# Patient Record
Sex: Male | Born: 1945 | Race: White | Hispanic: No | Smoking: Former smoker
Health system: Southern US, Community
[De-identification: ages and names within clinical notes are randomized; demographics above are authoritative.]

## PROBLEM LIST (undated history)

## (undated) DIAGNOSIS — I2510 Atherosclerotic heart disease of native coronary artery without angina pectoris: Secondary | ICD-10-CM

## (undated) DIAGNOSIS — E785 Hyperlipidemia, unspecified: Secondary | ICD-10-CM

## (undated) DIAGNOSIS — E119 Type 2 diabetes mellitus without complications: Secondary | ICD-10-CM

## (undated) DIAGNOSIS — K219 Gastro-esophageal reflux disease without esophagitis: Secondary | ICD-10-CM

## (undated) DIAGNOSIS — H9190 Unspecified hearing loss, unspecified ear: Secondary | ICD-10-CM

## (undated) DIAGNOSIS — I480 Paroxysmal atrial fibrillation: Secondary | ICD-10-CM

## (undated) DIAGNOSIS — I10 Essential (primary) hypertension: Secondary | ICD-10-CM

## (undated) HISTORY — DX: Paroxysmal atrial fibrillation: I48.0

## (undated) HISTORY — PX: COLONOSCOPY: SHX174

## (undated) HISTORY — PX: APPENDECTOMY: SHX54

---

## 2005-03-22 ENCOUNTER — Ambulatory Visit: Payer: Self-pay

## 2007-06-05 ENCOUNTER — Ambulatory Visit: Payer: Self-pay | Admitting: Gastroenterology

## 2010-03-28 ENCOUNTER — Ambulatory Visit: Payer: Self-pay | Admitting: Orthopedic Surgery

## 2010-04-26 ENCOUNTER — Ambulatory Visit: Payer: Self-pay | Admitting: Orthopedic Surgery

## 2013-03-05 ENCOUNTER — Ambulatory Visit: Payer: Self-pay | Admitting: Gastroenterology

## 2016-02-22 ENCOUNTER — Emergency Department (HOSPITAL_COMMUNITY)
Admission: EM | Admit: 2016-02-22 | Discharge: 2016-02-22 | Disposition: A | Discharge status: Home / Self Care | Payer: Medicare Other | Attending: Emergency Medicine | Admitting: Emergency Medicine

## 2016-02-22 ENCOUNTER — Emergency Department (HOSPITAL_COMMUNITY): Payer: Medicare Other

## 2016-02-22 ENCOUNTER — Encounter (HOSPITAL_COMMUNITY): Payer: Self-pay | Admitting: *Deleted

## 2016-02-22 DIAGNOSIS — E119 Type 2 diabetes mellitus without complications: Secondary | ICD-10-CM | POA: Diagnosis not present

## 2016-02-22 DIAGNOSIS — I10 Essential (primary) hypertension: Secondary | ICD-10-CM | POA: Insufficient documentation

## 2016-02-22 DIAGNOSIS — R05 Cough: Secondary | ICD-10-CM | POA: Diagnosis not present

## 2016-02-22 DIAGNOSIS — R059 Cough, unspecified: Secondary | ICD-10-CM

## 2016-02-22 HISTORY — DX: Essential (primary) hypertension: I10

## 2016-02-22 HISTORY — DX: Type 2 diabetes mellitus without complications: E11.9

## 2016-02-22 HISTORY — DX: Hyperlipidemia, unspecified: E78.5

## 2016-02-22 MED ORDER — PREDNISONE 50 MG PO TABS
60.0000 mg | ORAL_TABLET | ORAL | Status: AC
  Administered 2016-02-22: 60 mg via ORAL
  Filled 2016-02-22: qty 1

## 2016-02-22 MED ORDER — PREDNISONE 20 MG PO TABS: 60.0000 mg | ORAL_TABLET | Freq: Every day | ORAL | Status: AC

## 2016-02-22 MED ORDER — ALBUTEROL SULFATE HFA 108 (90 BASE) MCG/ACT IN AERS
2.0000 | INHALATION_SPRAY | Freq: Four times a day (QID) | RESPIRATORY_TRACT | Status: DC
  Administered 2016-02-22: 2 via RESPIRATORY_TRACT
  Filled 2016-02-22: qty 6.7

## 2016-02-22 NOTE — Discharge Instructions (Signed)
As discussed, your cough is likely due to bronchitis, or inflammation of your lungs.  Please take the prescribed steroids as directed. In addition, please use the provided albuterol inhaler every 4 hours for the next 2 days.  Subsequently you may use it as needed. Tomorrow, please make an appointment with your primary care physician for next week. Return here for concerning changes in your condition.

## 2016-02-22 NOTE — ED Notes (Addendum)
Non-productive cough since Saturday. Pt denies any other symptoms at this time. Hypertension. SBP 193

## 2016-02-22 NOTE — ED Provider Notes (Signed)
CSN: [id number]     Arrival date & time 02/22/16  1753 History   First MD Initiated Contact with Patient 02/22/16 1831     Chief Complaint  Patient presents with  . Cough     (Consider location/radiation/quality/duration/timing/severity/associated sxs/prior Treatment) HPI Patient presents with concern of cough. Symptoms began about 5 days ago. Since onset cough has been persistent, with no relief from OTC medication. There is discomfort about the sternum, associated with coughing, described as soreness. No other chest pain, no dyspnea, no lightheadedness, no syncope, fever, chills, nausea, vomiting. Patient was well prior to the onset of symptoms.  Past Medical History  Diagnosis Date  . Hypertension   . Diabetes mellitus without complication (HCC)   . Hyperlipidemia    Past Surgical History  Procedure Laterality Date  . Appendectomy     No family history on file. Social History  Substance Use Topics  . Smoking status: Never Smoker   . Smokeless tobacco: None  . Alcohol Use: No    Review of Systems  Constitutional:       Per HPI, otherwise negative  HENT:       Per HPI, otherwise negative  Respiratory:       Per HPI, otherwise negative  Cardiovascular:       Per HPI, otherwise negative  Gastrointestinal: Negative for vomiting.  Endocrine:       Negative aside from HPI  Genitourinary:       Neg aside from HPI   Musculoskeletal:       Per HPI, otherwise negative  Skin: Negative.   Neurological: Negative for syncope.      Allergies  Review of patient's allergies indicates no known allergies.  Home Medications   Prior to Admission medications   Medication Sig Start Date End Date Taking? Authorizing Provider  predniSONE (DELTASONE) 20 MG tablet Take 3 tablets (60 mg total) by mouth daily with breakfast. 02/23/16 02/26/16  Gerhard Munch, MD   BP 185/71 mmHg  Pulse 69  Temp(Src) 97.9 F (36.6 C) (Oral)  Resp 16  Ht 6' (1.829 m)  Wt 243 lb (110.224 kg)   BMI 32.95 kg/m2  SpO2 97% Physical Exam  Constitutional: He is oriented to person, place, and time. He appears well-developed. No distress.  HENT:  Head: Normocephalic and atraumatic.  Eyes: Conjunctivae and EOM are normal.  Cardiovascular: Normal rate and regular rhythm.   Pulmonary/Chest: Effort normal. No stridor. No respiratory distress.  Minimal discomfort about the sternum with light palpation  Abdominal: He exhibits no distension.  Musculoskeletal: He exhibits no edema.  Neurological: He is alert and oriented to person, place, and time.  Skin: Skin is warm and dry.  Psychiatric: He has a normal mood and affect.  Nursing note and vitals reviewed.   ED Course  Procedures (including critical care time) Labs Review Labs Reviewed - No data to display  Imaging Review Dg Chest 2 View  02/22/2016  CLINICAL DATA:  70 year old male with, chest discomfort and congestion for 5 days. EXAM: CHEST  2 VIEW COMPARISON:  None. FINDINGS: Cardiomegaly identified. There is no evidence of focal airspace disease, pulmonary edema, suspicious pulmonary nodule/mass, pleural effusion, or pneumothorax. No acute bony abnormalities are identified. IMPRESSION: Cardiomegaly without evidence of acute cardiopulmonary disease. Electronically Signed   By: Harmon Pier M.D.   On: 02/22/2016 18:12   I have personally reviewed and evaluated these images and lab results as part of my medical decision-making.    MDM   Final diagnoses:  Cough   Elderly male presents with concern of 5 days of cough.  On here, the patient is afebrile, hemodynamically stable aside from mild hypertension. No pulmonary edema, no evidence for fluid retention, no evidence for distress, pneumonia, bacteremia, sepsis. Patient started on a course of steroids, albuterol therapy, will follow up with primary care.  Gerhard Munch, MD 02/22/16 [phone]

## 2017-07-24 ENCOUNTER — Other Ambulatory Visit: Payer: Self-pay | Admitting: Orthopedic Surgery

## 2017-07-24 DIAGNOSIS — M25561 Pain in right knee: Secondary | ICD-10-CM

## 2017-07-24 DIAGNOSIS — M2391 Unspecified internal derangement of right knee: Secondary | ICD-10-CM

## 2017-07-24 DIAGNOSIS — M1711 Unilateral primary osteoarthritis, right knee: Secondary | ICD-10-CM

## 2017-07-30 ENCOUNTER — Ambulatory Visit: Payer: Medicare Other

## 2017-07-30 ENCOUNTER — Ambulatory Visit
Admission: RE | Admit: 2017-07-30 | Discharge: 2017-07-30 | Disposition: A | Discharge status: Home / Self Care | Payer: Medicare Other | Source: Ambulatory Visit | Attending: Orthopedic Surgery | Admitting: Orthopedic Surgery

## 2017-07-30 DIAGNOSIS — X58XXXA Exposure to other specified factors, initial encounter: Secondary | ICD-10-CM | POA: Insufficient documentation

## 2017-07-30 DIAGNOSIS — S83241A Other tear of medial meniscus, current injury, right knee, initial encounter: Secondary | ICD-10-CM | POA: Diagnosis not present

## 2017-07-30 DIAGNOSIS — M25561 Pain in right knee: Secondary | ICD-10-CM | POA: Diagnosis not present

## 2017-07-30 DIAGNOSIS — M1711 Unilateral primary osteoarthritis, right knee: Secondary | ICD-10-CM

## 2017-07-30 DIAGNOSIS — M2391 Unspecified internal derangement of right knee: Secondary | ICD-10-CM

## 2018-02-17 ENCOUNTER — Encounter: Payer: Self-pay | Admitting: *Deleted

## 2018-02-26 ENCOUNTER — Encounter
Admission: RE | Disposition: A | Discharge status: Home / Self Care | Payer: Self-pay | Source: Ambulatory Visit | Attending: Ophthalmology

## 2018-02-26 ENCOUNTER — Ambulatory Visit: Payer: Medicare Other | Admitting: Anesthesiology

## 2018-02-26 ENCOUNTER — Ambulatory Visit
Admission: RE | Admit: 2018-02-26 | Discharge: 2018-02-26 | Disposition: A | Discharge status: Home / Self Care | Payer: Medicare Other | Source: Ambulatory Visit | Attending: Ophthalmology | Admitting: Ophthalmology

## 2018-02-26 DIAGNOSIS — K219 Gastro-esophageal reflux disease without esophagitis: Secondary | ICD-10-CM | POA: Insufficient documentation

## 2018-02-26 DIAGNOSIS — Z87891 Personal history of nicotine dependence: Secondary | ICD-10-CM | POA: Insufficient documentation

## 2018-02-26 DIAGNOSIS — Z7984 Long term (current) use of oral hypoglycemic drugs: Secondary | ICD-10-CM | POA: Insufficient documentation

## 2018-02-26 DIAGNOSIS — E119 Type 2 diabetes mellitus without complications: Secondary | ICD-10-CM | POA: Insufficient documentation

## 2018-02-26 DIAGNOSIS — H2511 Age-related nuclear cataract, right eye: Secondary | ICD-10-CM | POA: Insufficient documentation

## 2018-02-26 DIAGNOSIS — E7800 Pure hypercholesterolemia, unspecified: Secondary | ICD-10-CM | POA: Diagnosis not present

## 2018-02-26 DIAGNOSIS — Z79899 Other long term (current) drug therapy: Secondary | ICD-10-CM | POA: Diagnosis not present

## 2018-02-26 DIAGNOSIS — I10 Essential (primary) hypertension: Secondary | ICD-10-CM | POA: Insufficient documentation

## 2018-02-26 HISTORY — DX: Gastro-esophageal reflux disease without esophagitis: K21.9

## 2018-02-26 HISTORY — DX: Unspecified hearing loss, unspecified ear: H91.90

## 2018-02-26 HISTORY — PX: CATARACT EXTRACTION W/PHACO: SHX586

## 2018-02-26 LAB — GLUCOSE, CAPILLARY: GLUCOSE-CAPILLARY: 160 mg/dL — AB (ref 65–99)

## 2018-02-26 SURGERY — PHACOEMULSIFICATION, CATARACT, WITH IOL INSERTION
Anesthesia: Monitor Anesthesia Care | Laterality: Right

## 2018-02-26 MED ORDER — FENTANYL CITRATE (PF) 100 MCG/2ML IJ SOLN
INTRAMUSCULAR | Status: DC | PRN
  Administered 2018-02-26: 50 ug via INTRAVENOUS

## 2018-02-26 MED ORDER — MIDAZOLAM HCL 2 MG/2ML IJ SOLN
INTRAMUSCULAR | Status: AC
  Filled 2018-02-26: qty 2

## 2018-02-26 MED ORDER — MOXIFLOXACIN HCL 0.5 % OP SOLN: 1.0000 [drp] | OPHTHALMIC | Status: DC | PRN

## 2018-02-26 MED ORDER — SODIUM HYALURONATE 10 MG/ML IO SOLN
INTRAOCULAR | Status: DC | PRN
  Administered 2018-02-26: 0.55 mL via INTRAOCULAR

## 2018-02-26 MED ORDER — ARMC OPHTHALMIC DILATING DROPS
OPHTHALMIC | Status: AC
  Administered 2018-02-26: 1 via OPHTHALMIC
  Filled 2018-02-26: qty 0.4

## 2018-02-26 MED ORDER — MIDAZOLAM HCL 2 MG/2ML IJ SOLN
INTRAMUSCULAR | Status: DC | PRN
  Administered 2018-02-26 (×2): 1 mg via INTRAVENOUS

## 2018-02-26 MED ORDER — POVIDONE-IODINE 5 % OP SOLN
OPHTHALMIC | Status: DC | PRN
  Administered 2018-02-26: 1 via OPHTHALMIC

## 2018-02-26 MED ORDER — FENTANYL CITRATE (PF) 100 MCG/2ML IJ SOLN
INTRAMUSCULAR | Status: AC
  Filled 2018-02-26: qty 2

## 2018-02-26 MED ORDER — BSS IO SOLN
INTRAOCULAR | Status: DC | PRN
  Administered 2018-02-26: .1 mL via OPHTHALMIC

## 2018-02-26 MED ORDER — SODIUM CHLORIDE 0.9 % IV SOLN
INTRAVENOUS | Status: DC
  Administered 2018-02-26: 07:00:00 via INTRAVENOUS

## 2018-02-26 MED ORDER — EPINEPHRINE PF 1 MG/ML IJ SOLN
INTRAMUSCULAR | Status: AC
  Filled 2018-02-26: qty 1

## 2018-02-26 MED ORDER — SODIUM HYALURONATE 23 MG/ML IO SOLN
INTRAOCULAR | Status: AC
  Filled 2018-02-26: qty 0.6

## 2018-02-26 MED ORDER — MOXIFLOXACIN HCL 0.5 % OP SOLN
OPHTHALMIC | Status: DC | PRN
  Administered 2018-02-26: 0.2 mL via OPHTHALMIC

## 2018-02-26 MED ORDER — TETRACAINE 0.5 % OP SOLN OPTIME - NO CHARGE
OPHTHALMIC | Status: DC | PRN
  Administered 2018-02-26: 2 [drp] via OPHTHALMIC

## 2018-02-26 MED ORDER — POVIDONE-IODINE 5 % OP SOLN
OPHTHALMIC | Status: AC
  Filled 2018-02-26: qty 30

## 2018-02-26 MED ORDER — SODIUM HYALURONATE 23 MG/ML IO SOLN
INTRAOCULAR | Status: DC | PRN
  Administered 2018-02-26: 0.6 mL via INTRAOCULAR

## 2018-02-26 MED ORDER — LIDOCAINE HCL (PF) 4 % IJ SOLN
INTRAMUSCULAR | Status: AC
  Filled 2018-02-26: qty 5

## 2018-02-26 MED ORDER — MOXIFLOXACIN HCL 0.5 % OP SOLN
OPHTHALMIC | Status: AC
  Filled 2018-02-26: qty 3

## 2018-02-26 MED ORDER — BSS IO SOLN
INTRAOCULAR | Status: DC | PRN
  Administered 2018-02-26: 1 via INTRAOCULAR

## 2018-02-26 MED ORDER — ARMC OPHTHALMIC DILATING DROPS
1.0000 "application " | OPHTHALMIC | Status: AC
  Administered 2018-02-26 (×2): 1 via OPHTHALMIC

## 2018-02-26 SURGICAL SUPPLY — 18 items
DISSECTOR HYDRO NUCLEUS 50X22 (MISCELLANEOUS) ×3 IMPLANT
GLOVE BIO SURGEON STRL SZ8 (GLOVE) ×3 IMPLANT
GLOVE BIOGEL M 6.5 STRL (GLOVE) ×3 IMPLANT
GLOVE SURG LX 7.5 STRW (GLOVE) ×2
GLOVE SURG LX STRL 7.5 STRW (GLOVE) ×1 IMPLANT
GOWN STRL REUS W/ TWL LRG LVL3 (GOWN DISPOSABLE) ×2 IMPLANT
GOWN STRL REUS W/TWL LRG LVL3 (GOWN DISPOSABLE) ×4
LABEL CATARACT MEDS ST (LABEL) ×3 IMPLANT
LENS IOL TECNIS ITEC 23.5 (Intraocular Lens) ×3 IMPLANT
PACK CATARACT (MISCELLANEOUS) ×3 IMPLANT
PACK CATARACT KING (MISCELLANEOUS) ×3 IMPLANT
PACK EYE AFTER SURG (MISCELLANEOUS) ×3 IMPLANT
SOL BAL SALT 15ML (MISCELLANEOUS) ×3
SOL BSS BAG (MISCELLANEOUS) ×3
SOLUTION BAL SALT 15ML (MISCELLANEOUS) ×1 IMPLANT
SOLUTION BSS BAG (MISCELLANEOUS) ×1 IMPLANT
WATER STERILE IRR 250ML POUR (IV SOLUTION) ×3 IMPLANT
WIPE NON LINTING 3.25X3.25 (MISCELLANEOUS) ×3 IMPLANT

## 2018-02-26 NOTE — Anesthesia Preprocedure Evaluation (Signed)
Anesthesia Evaluation  Patient identified by MRN, date of birth, ID band Patient awake    Reviewed: Allergy & Precautions, H&P , NPO status , Patient's Chart, lab work & pertinent test results  History of Anesthesia Complications Negative for: history of anesthetic complications  Airway Mallampati: III  TM Distance: <3 FB Neck ROM: limited    Dental  (+) Poor Dentition, Missing, Edentulous Upper, Edentulous Lower, Upper Dentures, Lower Dentures   Pulmonary neg shortness of breath, former smoker,           Cardiovascular Exercise Tolerance: Good hypertension, (-) angina(-) Past MI and (-) DOE      Neuro/Psych negative neurological ROS  negative psych ROS   GI/Hepatic Neg liver ROS, GERD  Medicated and Controlled,  Endo/Other  diabetes, Type 2  Renal/GU      Musculoskeletal   Abdominal   Peds  Hematology negative hematology ROS (+)   Anesthesia Other Findings Past Medical History: No date: Diabetes mellitus without complication (HCC) No date: GERD (gastroesophageal reflux disease) No date: HOH (hard of hearing)     Comment:  AIDS No date: Hyperlipidemia No date: Hypertension  Past Surgical History: No date: APPENDECTOMY No date: COLONOSCOPY  BMI    Body Mass Index:  33.91 kg/m      Reproductive/Obstetrics negative OB ROS                             Anesthesia Physical Anesthesia Plan  ASA: III  Anesthesia Plan: MAC   Post-op Pain Management:    Induction: Intravenous  PONV Risk Score and Plan:   Airway Management Planned: Natural Airway and Nasal Cannula  Additional Equipment:   Intra-op Plan:   Post-operative Plan:   Informed Consent: I have reviewed the patients History and Physical, chart, labs and discussed the procedure including the risks, benefits and alternatives for the proposed anesthesia with the patient or authorized representative who has indicated  his/her understanding and acceptance.   Dental Advisory Given  Plan Discussed with: Anesthesiologist, CRNA and Surgeon  Anesthesia Plan Comments: (Patient consented for risks of anesthesia including but not limited to:  - adverse reactions to medications - damage to teeth, lips or other oral mucosa - sore throat or hoarseness - Damage to heart, brain, lungs or loss of life  Patient voiced understanding.)        Anesthesia Quick Evaluation

## 2018-02-26 NOTE — Discharge Instructions (Signed)
Eye Surgery Discharge Instructions  Expect mild scratchy sensation or mild soreness. DO NOT RUB YOUR EYE!  The day of surgery:  Minimal physical activity, but bed rest is not required  No reading, computer work, or close hand work  No bending, lifting, or straining.  May watch TV  For 24 hours:  No driving, legal decisions, or alcoholic beverages  Safety precautions  Eat anything you prefer: It is better to start with liquids, then soup then solid foods.  _____ Eye patch should be worn until postoperative exam tomorrow.  ____ Solar shield eyeglasses should be worn for comfort in the sunlight/patch while sleeping  Resume all regular medications including aspirin or Coumadin if these were discontinued prior to surgery. You may shower, bathe, shave, or wash your hair. Tylenol may be taken for mild discomfort.  Call your doctor if you experience significant pain, nausea, or vomiting, fever > 101 or other signs of infection. [phone] or [phone] Specific instructions:  Follow-up Information    Nevada Crane, MD Follow up.   Specialty:  Ophthalmology Why:  March 8 at 10:40am Contact information: [address] [place] [address] [phone]

## 2018-02-26 NOTE — Transfer of Care (Signed)
Immediate Anesthesia Transfer of Care Note  Patient: Herbert Torres  Procedure(s) Performed: CATARACT EXTRACTION PHACO AND INTRAOCULAR LENS PLACEMENT (IOC) (Right )  Patient Location: PACU  Anesthesia Type:MAC  Level of Consciousness: awake, alert  and oriented  Airway & Oxygen Therapy: Patient Spontanous Breathing  Post-op Assessment: Report given to RN  Post vital signs: Reviewed and stable  Last Vitals:  Vitals:   02/26/18 0624  BP: (!) 168/70  Pulse: 68  Resp: 17  Temp: 37.1 C  SpO2: 96%    Last Pain:  Vitals:   02/26/18 0624  TempSrc: Oral         Complications: No apparent anesthesia complications

## 2018-02-26 NOTE — Anesthesia Postprocedure Evaluation (Signed)
Anesthesia Post Note  Patient: Herbert Torres  Procedure(s) Performed: CATARACT EXTRACTION PHACO AND INTRAOCULAR LENS PLACEMENT (IOC) (Right )  Patient location during evaluation: PACU Anesthesia Type: MAC Level of consciousness: awake and alert Pain management: pain level controlled Vital Signs Assessment: post-procedure vital signs reviewed and stable Respiratory status: spontaneous breathing, nonlabored ventilation, respiratory function stable and patient connected to nasal cannula oxygen Cardiovascular status: stable and blood pressure returned to baseline Postop Assessment: no apparent nausea or vomiting Anesthetic complications: no     Last Vitals:  Vitals:   02/26/18 0807 02/26/18 0820  BP: (!) 130/57 123/60  Pulse: 61 (!) 57  Resp: 16   Temp:    SpO2: 97%     Last Pain:  Vitals:   02/26/18 0624  TempSrc: Oral                 Precious Haws Piscitello

## 2018-02-26 NOTE — Anesthesia Post-op Follow-up Note (Signed)
Anesthesia QCDR form completed.        

## 2018-02-26 NOTE — H&P (Signed)
The History and Physical notes are on paper, have been signed, and are to be scanned.   I have examined the patient and there are no changes to the H&P.   Willey Blade 02/26/2018 7:23 AM

## 2018-02-26 NOTE — Op Note (Signed)
OPERATIVE NOTE  Herbert Torres [id number] 02/26/2018   PREOPERATIVE DIAGNOSIS:  Nuclear sclerotic cataract right eye.  H25.11   POSTOPERATIVE DIAGNOSIS:    Nuclear sclerotic cataract right eye.     PROCEDURE:  Phacoemusification with posterior chamber intraocular lens placement of the right eye   LENS:   Implant Name Type Inv. Item Serial No. Manufacturer Lot No. LRB No. Used  LENS IOL DIOP 23.5 - [id number] Intraocular Lens LENS IOL DIOP 23.5 [id number]  Right 1       PCB00 +23.5   ULTRASOUND TIME: 0 minutes 38 seconds.  CDE 6.33   SURGEON:  Willey Blade, MD, MPH  ANESTHESIOLOGIST: Anesthesiologist: Piscitello, Cleda Mccreedy, MD CRNA: Danelle Berry, CRNA   ANESTHESIA:  Topical with tetracaine drops augmented with 1% preservative-free intracameral lidocaine.  ESTIMATED BLOOD LOSS: less than 1 mL.   COMPLICATIONS:  None.   DESCRIPTION OF PROCEDURE:  The patient was identified in the holding room and transported to the operating room and placed in the supine position under the operating microscope.  The right eye was identified as the operative eye and it was prepped and draped in the usual sterile ophthalmic fashion.   A 1.0 millimeter clear-corneal paracentesis was made at the 10:30 position. 0.5 ml of preservative-free 1% lidocaine with epinephrine was injected into the anterior chamber.  The anterior chamber was filled with Healon 5 viscoelastic.  A 2.4 millimeter keratome was used to make a near-clear corneal incision at the 8:00 position.  A curvilinear capsulorrhexis was made with a cystotome and capsulorrhexis forceps.  Balanced salt solution was used to hydrodissect and hydrodelineate the nucleus.   Phacoemulsification was then used in stop and chop fashion to remove the lens nucleus and epinucleus.  The remaining cortex was then removed using the irrigation and aspiration handpiece. Healon was then placed into the capsular bag to distend it for lens placement.  A lens  was then injected into the capsular bag.  The remaining viscoelastic was aspirated.   Wounds were hydrated with balanced salt solution.  The anterior chamber was inflated to a physiologic pressure with balanced salt solution.   Intracameral vigamox 0.1 mL undiluted was injected into the eye and a drop placed onto the ocular surface.  No wound leaks were noted.  The patient was taken to the recovery room in stable condition without complications of anesthesia or surgery  Willey Blade 02/26/2018, 8:05 AM

## 2018-02-26 NOTE — Anesthesia Postprocedure Evaluation (Signed)
Anesthesia Post Note  Patient: Herbert Torres  Procedure(s) Performed: CATARACT EXTRACTION PHACO AND INTRAOCULAR LENS PLACEMENT (IOC) (Right )  Patient location during evaluation: PACU Anesthesia Type: MAC Level of consciousness: awake, awake and alert and oriented Pain management: pain level controlled Vital Signs Assessment: post-procedure vital signs reviewed and stable Respiratory status: spontaneous breathing Cardiovascular status: blood pressure returned to baseline Postop Assessment: no headache Anesthetic complications: no     Last Vitals:  Vitals:   02/26/18 0624  BP: (!) 168/70  Pulse: 68  Resp: 17  Temp: 37.1 C  SpO2: 96%    Last Pain:  Vitals:   02/26/18 0624  TempSrc: Oral                 Philbert Riser

## 2020-10-15 ENCOUNTER — Emergency Department (HOSPITAL_COMMUNITY): Payer: Medicare Other

## 2020-10-15 ENCOUNTER — Inpatient Hospital Stay (HOSPITAL_COMMUNITY)
Admission: EM | Admit: 2020-10-15 | Discharge: 2020-10-29 | DRG: 234 | Disposition: A | Discharge status: Home Health | Payer: Medicare Other | Attending: Thoracic Surgery (Cardiothoracic Vascular Surgery) | Admitting: Thoracic Surgery (Cardiothoracic Vascular Surgery)

## 2020-10-15 ENCOUNTER — Other Ambulatory Visit: Payer: Self-pay

## 2020-10-15 ENCOUNTER — Encounter (HOSPITAL_COMMUNITY): Payer: Self-pay | Admitting: Emergency Medicine

## 2020-10-15 DIAGNOSIS — I2584 Coronary atherosclerosis due to calcified coronary lesion: Secondary | ICD-10-CM | POA: Diagnosis present

## 2020-10-15 DIAGNOSIS — Z79899 Other long term (current) drug therapy: Secondary | ICD-10-CM

## 2020-10-15 DIAGNOSIS — R001 Bradycardia, unspecified: Secondary | ICD-10-CM | POA: Diagnosis present

## 2020-10-15 DIAGNOSIS — T447X5A Adverse effect of beta-adrenoreceptor antagonists, initial encounter: Secondary | ICD-10-CM | POA: Diagnosis not present

## 2020-10-15 DIAGNOSIS — I110 Hypertensive heart disease with heart failure: Secondary | ICD-10-CM | POA: Diagnosis not present

## 2020-10-15 DIAGNOSIS — J90 Pleural effusion, not elsewhere classified: Secondary | ICD-10-CM

## 2020-10-15 DIAGNOSIS — E119 Type 2 diabetes mellitus without complications: Secondary | ICD-10-CM

## 2020-10-15 DIAGNOSIS — Z20822 Contact with and (suspected) exposure to covid-19: Secondary | ICD-10-CM | POA: Diagnosis not present

## 2020-10-15 DIAGNOSIS — E7800 Pure hypercholesterolemia, unspecified: Secondary | ICD-10-CM

## 2020-10-15 DIAGNOSIS — Z7901 Long term (current) use of anticoagulants: Secondary | ICD-10-CM | POA: Diagnosis not present

## 2020-10-15 DIAGNOSIS — I10 Essential (primary) hypertension: Secondary | ICD-10-CM | POA: Diagnosis not present

## 2020-10-15 DIAGNOSIS — Z7984 Long term (current) use of oral hypoglycemic drugs: Secondary | ICD-10-CM

## 2020-10-15 DIAGNOSIS — I4819 Other persistent atrial fibrillation: Secondary | ICD-10-CM | POA: Diagnosis present

## 2020-10-15 DIAGNOSIS — E1165 Type 2 diabetes mellitus with hyperglycemia: Secondary | ICD-10-CM | POA: Diagnosis not present

## 2020-10-15 DIAGNOSIS — K219 Gastro-esophageal reflux disease without esophagitis: Secondary | ICD-10-CM | POA: Diagnosis present

## 2020-10-15 DIAGNOSIS — I2510 Atherosclerotic heart disease of native coronary artery without angina pectoris: Secondary | ICD-10-CM | POA: Diagnosis not present

## 2020-10-15 DIAGNOSIS — I4891 Unspecified atrial fibrillation: Secondary | ICD-10-CM

## 2020-10-15 DIAGNOSIS — Z87891 Personal history of nicotine dependence: Secondary | ICD-10-CM

## 2020-10-15 DIAGNOSIS — I952 Hypotension due to drugs: Secondary | ICD-10-CM | POA: Diagnosis not present

## 2020-10-15 DIAGNOSIS — I214 Non-ST elevation (NSTEMI) myocardial infarction: Secondary | ICD-10-CM | POA: Diagnosis not present

## 2020-10-15 DIAGNOSIS — R0602 Shortness of breath: Secondary | ICD-10-CM

## 2020-10-15 DIAGNOSIS — Z951 Presence of aortocoronary bypass graft: Secondary | ICD-10-CM

## 2020-10-15 DIAGNOSIS — I484 Atypical atrial flutter: Secondary | ICD-10-CM | POA: Diagnosis not present

## 2020-10-15 DIAGNOSIS — I428 Other cardiomyopathies: Secondary | ICD-10-CM | POA: Diagnosis not present

## 2020-10-15 DIAGNOSIS — Z9889 Other specified postprocedural states: Secondary | ICD-10-CM

## 2020-10-15 DIAGNOSIS — I4821 Permanent atrial fibrillation: Secondary | ICD-10-CM | POA: Diagnosis not present

## 2020-10-15 DIAGNOSIS — Z8249 Family history of ischemic heart disease and other diseases of the circulatory system: Secondary | ICD-10-CM | POA: Diagnosis not present

## 2020-10-15 DIAGNOSIS — R42 Dizziness and giddiness: Secondary | ICD-10-CM | POA: Diagnosis not present

## 2020-10-15 DIAGNOSIS — I25110 Atherosclerotic heart disease of native coronary artery with unstable angina pectoris: Secondary | ICD-10-CM | POA: Diagnosis present

## 2020-10-15 DIAGNOSIS — R531 Weakness: Secondary | ICD-10-CM | POA: Diagnosis not present

## 2020-10-15 DIAGNOSIS — I5032 Chronic diastolic (congestive) heart failure: Secondary | ICD-10-CM | POA: Diagnosis present

## 2020-10-15 DIAGNOSIS — D72829 Elevated white blood cell count, unspecified: Secondary | ICD-10-CM | POA: Diagnosis not present

## 2020-10-15 DIAGNOSIS — E782 Mixed hyperlipidemia: Secondary | ICD-10-CM | POA: Diagnosis present

## 2020-10-15 DIAGNOSIS — Z8673 Personal history of transient ischemic attack (TIA), and cerebral infarction without residual deficits: Secondary | ICD-10-CM

## 2020-10-15 DIAGNOSIS — R197 Diarrhea, unspecified: Secondary | ICD-10-CM | POA: Diagnosis not present

## 2020-10-15 DIAGNOSIS — E875 Hyperkalemia: Secondary | ICD-10-CM | POA: Diagnosis not present

## 2020-10-15 DIAGNOSIS — R079 Chest pain, unspecified: Secondary | ICD-10-CM | POA: Diagnosis not present

## 2020-10-15 HISTORY — DX: Atherosclerotic heart disease of native coronary artery without angina pectoris: I25.10

## 2020-10-15 LAB — TROPONIN I (HIGH SENSITIVITY)
Troponin I (High Sensitivity): 266 ng/L (ref <18)
Troponin I (High Sensitivity): 338 ng/L (ref <18)
Troponin I (High Sensitivity): 423 ng/L (ref <18)
Troponin I (High Sensitivity): 458 ng/L (ref <18)

## 2020-10-15 LAB — CBC WITH DIFFERENTIAL/PLATELET
Abs Immature Granulocytes: 0.02 10*3/uL (ref 0.00–0.07)
Basophils Absolute: 0.1 10*3/uL (ref 0.0–0.1)
Basophils Relative: 1 %
Eosinophils Absolute: 0.3 10*3/uL (ref 0.0–0.5)
Eosinophils Relative: 4 %
HCT: 43.5 % (ref 39.0–52.0)
Hemoglobin: 14.2 g/dL (ref 13.0–17.0)
Immature Granulocytes: 0 %
Lymphocytes Relative: 26 %
Lymphs Abs: 1.6 10*3/uL (ref 0.7–4.0)
MCH: 30.7 pg (ref 26.0–34.0)
MCHC: 32.6 g/dL (ref 30.0–36.0)
MCV: 94 fL (ref 80.0–100.0)
Monocytes Absolute: 0.4 10*3/uL (ref 0.1–1.0)
Monocytes Relative: 7 %
Neutro Abs: 4 10*3/uL (ref 1.7–7.7)
Neutrophils Relative %: 62 %
Platelets: 285 10*3/uL (ref 150–400)
RBC: 4.63 MIL/uL (ref 4.22–5.81)
RDW: 12.8 % (ref 11.5–15.5)
WBC: 6.4 10*3/uL (ref 4.0–10.5)
nRBC: 0 % (ref 0.0–0.2)

## 2020-10-15 LAB — COMPREHENSIVE METABOLIC PANEL
ALT: 23 U/L (ref 0–44)
AST: 24 U/L (ref 15–41)
Albumin: 4.3 g/dL (ref 3.5–5.0)
Alkaline Phosphatase: 50 U/L (ref 38–126)
Anion gap: 11 (ref 5–15)
BUN: 12 mg/dL (ref 8–23)
CO2: 23 mmol/L (ref 22–32)
Calcium: 9 mg/dL (ref 8.9–10.3)
Chloride: 100 mmol/L (ref 98–111)
Creatinine, Ser: 0.95 mg/dL (ref 0.61–1.24)
GFR, Estimated: >60 mL/min (ref >60)
Glucose, Bld: 156 mg/dL — ABNORMAL HIGH (ref 70–99)
Potassium: 4 mmol/L (ref 3.5–5.1)
Sodium: 134 mmol/L — ABNORMAL LOW (ref 135–145)
Total Bilirubin: 0.5 mg/dL (ref 0.3–1.2)
Total Protein: 7.6 g/dL (ref 6.5–8.1)

## 2020-10-15 LAB — BRAIN NATRIURETIC PEPTIDE: B Natriuretic Peptide: 154 pg/mL — ABNORMAL HIGH (ref 0.0–100.0)

## 2020-10-15 LAB — PROTIME-INR
INR: 1.1 (ref 0.8–1.2)
Prothrombin Time: 13.6 seconds (ref 11.4–15.2)

## 2020-10-15 LAB — CBG MONITORING, ED: Glucose-Capillary: 147 mg/dL — ABNORMAL HIGH (ref 70–99)

## 2020-10-15 LAB — GLUCOSE, CAPILLARY
Glucose-Capillary: 131 mg/dL — ABNORMAL HIGH (ref 70–99)
Glucose-Capillary: 141 mg/dL — ABNORMAL HIGH (ref 70–99)

## 2020-10-15 LAB — APTT: aPTT: 33 seconds (ref 24–36)

## 2020-10-15 LAB — HEPARIN LEVEL (UNFRACTIONATED): Heparin Unfractionated: >2.2 IU/mL — ABNORMAL HIGH (ref 0.30–0.70)

## 2020-10-15 MED ORDER — INSULIN ASPART 100 UNIT/ML ~~LOC~~ SOLN: 0.0000 [IU] | Freq: Every day | SUBCUTANEOUS | Status: DC

## 2020-10-15 MED ORDER — SODIUM CHLORIDE 0.9% FLUSH
3.0000 mL | Freq: Two times a day (BID) | INTRAVENOUS | Status: DC
  Administered 2020-10-15 – 2020-10-17 (×4): 3 mL via INTRAVENOUS

## 2020-10-15 MED ORDER — AMLODIPINE BESYLATE 10 MG PO TABS
10.0000 mg | ORAL_TABLET | Freq: Every day | ORAL | Status: DC
  Administered 2020-10-16 – 2020-10-17 (×2): 10 mg via ORAL
  Filled 2020-10-15 (×2): qty 1

## 2020-10-15 MED ORDER — NITROGLYCERIN 0.4 MG SL SUBL
0.4000 mg | SUBLINGUAL_TABLET | SUBLINGUAL | Status: DC | PRN
  Administered 2020-10-18: 0.4 mg via SUBLINGUAL
  Filled 2020-10-15: qty 1

## 2020-10-15 MED ORDER — INSULIN ASPART 100 UNIT/ML ~~LOC~~ SOLN
0.0000 [IU] | Freq: Three times a day (TID) | SUBCUTANEOUS | Status: DC
  Administered 2020-10-16: 2 [IU] via SUBCUTANEOUS
  Administered 2020-10-16 – 2020-10-17 (×4): 3 [IU] via SUBCUTANEOUS

## 2020-10-15 MED ORDER — SODIUM CHLORIDE 0.9% FLUSH: 3.0000 mL | INTRAVENOUS | Status: DC | PRN

## 2020-10-15 MED ORDER — ASPIRIN 300 MG RE SUPP: 300.0000 mg | RECTAL | Status: AC

## 2020-10-15 MED ORDER — ATORVASTATIN CALCIUM 80 MG PO TABS: 80.0000 mg | ORAL_TABLET | Freq: Every day | ORAL | Status: DC

## 2020-10-15 MED ORDER — CARVEDILOL 6.25 MG PO TABS
6.2500 mg | ORAL_TABLET | Freq: Two times a day (BID) | ORAL | Status: DC
  Administered 2020-10-15 – 2020-10-17 (×5): 6.25 mg via ORAL
  Filled 2020-10-15 (×5): qty 1

## 2020-10-15 MED ORDER — ASPIRIN EC 81 MG PO TBEC
81.0000 mg | DELAYED_RELEASE_TABLET | Freq: Every day | ORAL | Status: DC
  Administered 2020-10-17: 81 mg via ORAL
  Filled 2020-10-15 (×2): qty 1

## 2020-10-15 MED ORDER — SODIUM CHLORIDE 0.9 % IV SOLN: INTRAVENOUS | Status: DC

## 2020-10-15 MED ORDER — PANTOPRAZOLE SODIUM 40 MG PO TBEC
40.0000 mg | DELAYED_RELEASE_TABLET | Freq: Every day | ORAL | Status: DC
  Administered 2020-10-15 – 2020-10-17 (×3): 40 mg via ORAL
  Filled 2020-10-15 (×3): qty 1

## 2020-10-15 MED ORDER — HEPARIN (PORCINE) 25000 UT/250ML-% IV SOLN
1400.0000 [IU]/h | INTRAVENOUS | Status: DC
  Administered 2020-10-15: 1200 [IU]/h via INTRAVENOUS
  Filled 2020-10-15: qty 250

## 2020-10-15 MED ORDER — SODIUM CHLORIDE 0.9 % IV SOLN
250.0000 mL | INTRAVENOUS | Status: DC | PRN
  Administered 2020-10-16: 250 mL via INTRAVENOUS

## 2020-10-15 MED ORDER — HYDRALAZINE HCL 25 MG PO TABS
25.0000 mg | ORAL_TABLET | Freq: Three times a day (TID) | ORAL | Status: DC
  Administered 2020-10-15 (×2): 25 mg via ORAL
  Filled 2020-10-15 (×2): qty 1

## 2020-10-15 MED ORDER — LISINOPRIL 40 MG PO TABS
40.0000 mg | ORAL_TABLET | Freq: Every day | ORAL | Status: DC
  Administered 2020-10-16 – 2020-10-17 (×2): 40 mg via ORAL
  Filled 2020-10-15 (×2): qty 1

## 2020-10-15 MED ORDER — ONDANSETRON HCL 4 MG/2ML IJ SOLN: 4.0000 mg | Freq: Four times a day (QID) | INTRAMUSCULAR | Status: DC | PRN

## 2020-10-15 MED ORDER — ASPIRIN 81 MG PO CHEW: 324.0000 mg | CHEWABLE_TABLET | ORAL | Status: AC

## 2020-10-15 MED ORDER — ROSUVASTATIN CALCIUM 20 MG PO TABS
20.0000 mg | ORAL_TABLET | Freq: Every day | ORAL | Status: DC
  Administered 2020-10-15 – 2020-10-29 (×14): 20 mg via ORAL
  Filled 2020-10-15 (×14): qty 1

## 2020-10-15 MED ORDER — ASPIRIN 81 MG PO CHEW
81.0000 mg | CHEWABLE_TABLET | ORAL | Status: AC
  Administered 2020-10-16: 81 mg via ORAL
  Filled 2020-10-15: qty 1

## 2020-10-15 MED ORDER — NITROGLYCERIN 2 % TD OINT
0.5000 [in_us] | TOPICAL_OINTMENT | Freq: Once | TRANSDERMAL | Status: AC
  Administered 2020-10-15: 0.5 [in_us] via TOPICAL
  Filled 2020-10-15: qty 1

## 2020-10-15 MED ORDER — ACETAMINOPHEN 325 MG PO TABS
650.0000 mg | ORAL_TABLET | ORAL | Status: DC | PRN
  Administered 2020-10-16: 650 mg via ORAL
  Filled 2020-10-15: qty 2

## 2020-10-15 NOTE — ED Notes (Signed)
ED Provider at bedside. 

## 2020-10-15 NOTE — ED Notes (Signed)
Awaiting transport   Awaiting room ready

## 2020-10-15 NOTE — ED Triage Notes (Signed)
Pt c/o of intermittent cp x 1 week. Worse with ambulation. Nitro given by EMS. 324 of ASA taken at home.   Denies cp at this time

## 2020-10-15 NOTE — Progress Notes (Signed)
Patient arrived via EMS transport from [facility].

## 2020-10-15 NOTE — Progress Notes (Signed)
ANTICOAGULATION CONSULT NOTE - Initial Consult  Pharmacy Consult for Heparin Indication: chest pain/ACS  No Known Allergies  Patient Measurements: Height: 6' (182.9 cm) Weight: 108.9 kg (240 lb) IBW/kg (Calculated) : 77.6 HEPARIN DW (KG): 100.6  Vital Signs: Temp: 97.4 F (36.3 C) (10/24 0936) Temp Source: Oral (10/24 0936) BP: 146/76 (10/24 1100) Pulse Rate: 56 (10/24 1100)  Labs: Recent Labs    10/15/20 0958  HGB 14.2  HCT 43.5  PLT 285  LABPROT 13.6  INR 1.1  CREATININE 0.95  TROPONINIHS 266*    Estimated Creatinine Clearance: 85.6 mL/min (by C-G formula based on SCr of 0.95 mg/dL).   Medical History: Past Medical History:  Diagnosis Date  . Diabetes mellitus without complication (HCC)   . GERD (gastroesophageal reflux disease)   . HOH (hard of hearing)    AIDS  . Hyperlipidemia   . Hypertension     Medications:  See med rec  Assessment: Patient presented with chest pain. He is chronically on eliquis for afib and last dose was taken 10/24 this AM at 0600. Pharmacy asked to start heparin. Will start at 1800 without a bolus and order labs including APTT and anti-xa levels.  Goal of Therapy:  Heparin level 0.3-0.7 units/ml aPTT 66-102 seconds Monitor platelets by anticoagulation protocol: Yes   Plan:  Start heparin infusion at 1200 units/hr at 1800 without bolus Check APTT and anti-Xa level now and  in ~8 hours and daily while on heparin Continue to monitor H&H and platelets  Elder Cyphers, BS Loura Back, BCPS Clinical Pharmacist Pager [phone] 10/15/2020,11:18 AM

## 2020-10-15 NOTE — H&P (Signed)
Cardiology Admission History and Physical:   Patient ID: Herbert Torres MRN: [id number]; DOB: [birth year]   Admission date: 10/15/2020  Primary Care Provider: [facility] Cardiologist: No primary care provider on file.   [facility] Electrophysiologist:  None   Chief Complaint: NSTEMI  Patient Profile:   Herbert Torres is a 74 y.o. male with paroxysmal atrial fibrillation, prior tobacco abuse (quit 30 years ago), diabetes, hypertension, and hyperlipidemia admitted with NSTEMI.  History of Present Illness:   Herbert Torres presented to [facility] with chest pain.  He had intermittent chest pain ongoing for the prior 2 to 3 weeks.  He described it as heaviness radiating across his chest and towards the left.  It is worse with exertion or bending over.  There is no associated shortness of breath, nausea, or diaphoresis.  He does note that he has had shortness of breath when he tries to lay on his back lately.  He denies any lower extremity edema.  Upon arrival to the emergency department EKG had no ischemic changes other than very mild lateral ST depression.  High-sensitivity troponin was elevated to 266.  He was started on IV heparin and transferred to [facility] for cardiac catheterization.  He had a prior nuclear stress test 06/2020 with Dr. Gwen Pounds in [place].  LVEF was 57% and there is no evidence of ischemia.  He notes that several people in his family have had heart disease but he has never been told that he did.  He is on Eliquis for atrial fibrillation and took his last dose at 6 AM on 10/24.  Past Medical History:  Diagnosis Date  . Diabetes mellitus without complication (HCC)   . GERD (gastroesophageal reflux disease)   . HOH (hard of hearing)    AIDS  . Hyperlipidemia   . Hypertension     Past Surgical History:  Procedure Laterality Date  . APPENDECTOMY    . CATARACT EXTRACTION W/PHACO Right 02/26/2018   Procedure: CATARACT  EXTRACTION PHACO AND INTRAOCULAR LENS PLACEMENT (IOC);  Surgeon: Nevada Crane, MD;  Location: [facility] ORS;  Service: Ophthalmology;  Laterality: Right;  fluid pak loT# [id number] H  exp10/31/2020 [place]   00:38.9 AP%   16.3 CDE   6.33  . COLONOSCOPY       Medications Prior to Admission: Prior to Admission medications   Medication Sig Start Date End Date Taking? Authorizing Provider  apixaban (ELIQUIS) 5 MG TABS tablet Take 5 mg by mouth in the morning and at bedtime. 06/17/19  Yes [provider]  amLODipine (NORVASC) 10 MG tablet Take 10 mg by mouth daily. for high blood pressure 02/08/16   [provider]  carvedilol (COREG) 12.5 MG tablet Take 12.5 mg by mouth 2 (two) times daily. 01/21/16   [provider]  hydrALAZINE (APRESOLINE) 25 MG tablet Take 25 mg by mouth 3 (three) times daily.    [provider]  lisinopril (PRINIVIL,ZESTRIL) 40 MG tablet Take 40 mg by mouth daily. 12/19/15   [provider]  metFORMIN (GLUCOPHAGE) 1000 MG tablet Take 1,000 mg by mouth 2 (two) times daily. 02/08/16   [provider]  Omega-3 Fatty Acids ([employer] NATURAL FISH OIL) 1000 MG CAPS Take 1 capsule by mouth 2 (two) times daily. 02/15/16   [provider]  omeprazole (PRILOSEC) 20 MG capsule Take 20 mg by mouth daily. 02/08/16   [provider]  Rosuvastatin Calcium (CRESTOR PO) Take by mouth.    [provider]  simvastatin (ZOCOR) 20 MG tablet Take 20 mg by mouth at bedtime. 02/08/16   [provider]     Allergies:   No Known Allergies  Social History:   Social History   Socioeconomic History  . Marital status: Divorced    Spouse name: Not on file  . Number of children: Not on file  . Years of education: Not on file  . Highest education level: Not on file  Occupational History  . Not on file  Tobacco Use  . Smoking status: Former Games developer  . Smokeless tobacco: Never Used  Vaping Use  . Vaping Use: Never used   Substance and Sexual Activity  . Alcohol use: No  . Drug use: Not on file  . Sexual activity: Not on file  Other Topics Concern  . Not on file  Social History Narrative  . Not on file   Social Determinants of Health   Financial Resource Strain:   . Difficulty of Paying Living Expenses: Not on file  Food Insecurity:   . Worried About Programme researcher, broadcasting/film/video in the Last Year: Not on file  . Ran Out of Food in the Last Year: Not on file  Transportation Needs:   . Lack of Transportation (Medical): Not on file  . Lack of Transportation (Non-Medical): Not on file  Physical Activity:   . Days of Exercise per Week: Not on file  . Minutes of Exercise per Session: Not on file  Stress:   . Feeling of Stress : Not on file  Social Connections:   . Frequency of Communication with Friends and Family: Not on file  . Frequency of Social Gatherings with Friends and Family: Not on file  . Attends Religious Services: Not on file  . Active Member of Clubs or Organizations: Not on file  . Attends Banker Meetings: Not on file  . Marital Status: Not on file  Intimate Partner Violence:   . Fear of Current or Ex-Partner: Not on file  . Emotionally Abused: Not on file  . Physically Abused: Not on file  . Sexually Abused: Not on file    Family History:   The patient's family history includes Heart attack in his brother, father, mother, and sister.    ROS:  Please see the history of present illness.  All other ROS reviewed and negative.     Physical Exam/Data:   Vitals:   10/15/20 1330 10/15/20 1400 10/15/20 1408 10/15/20 1523  BP:  (!) 142/65 (!) 142/65 (!) 145/71  Pulse: 62 66 66 70  Resp: 20 19  18   Temp:   97.9 F (36.6 C) 97.8 F (36.6 C)  TempSrc:   Oral Oral  SpO2: 97% 96% 96% 97%  Weight:    108.7 kg  Height:    6' (1.829 m)   No intake or output data in the 24 hours ending 10/15/20 1620 Last 3 Weights 10/15/2020 10/15/2020 02/17/2018  Weight (lbs) 239 lb 9.6 oz 240  lb 250 lb  Weight (kg) 108.682 kg 108.863 kg 113.399 kg   VS:  BP (!) 145/71 (BP Location: Right Arm)   Pulse 70   Temp 97.8 F (36.6 C) (Oral)   Resp 18   Ht 6' (1.829 m)   Wt 108.7 kg   SpO2 97%   BMI 32.50 kg/m  , BMI Body mass index is 32.5 kg/m. GENERAL:  Well appearing HEENT: Pupils equal round and reactive, fundi not visualized, oral mucosa unremarkable NECK:  No jugular venous distention, waveform within normal limits, carotid upstroke brisk and symmetric, no bruits LUNGS:  Clear to auscultation bilaterally HEART:  RRR.  PMI not displaced or sustained,S1 and S2 within normal limits, no S3, no S4, no clicks, no rubs, no murmurs ABD:  Flat, positive bowel sounds normal in frequency in pitch, no bruits, no rebound, no guarding, no midline pulsatile mass, no hepatomegaly, no splenomegaly EXT:  2 plus pulses throughout, no edema, no cyanosis no clubbing SKIN:  No rashes no nodules NEURO:  Cranial nerves II through XII grossly intact, motor grossly intact throughout PSYCH:  Cognitively intact, oriented to person place and time    EKG:  The ECG that was done 10/15/2020 was personally reviewed and demonstrates sinus rhythm.  Rate 61 bpm.  PACs.  Minimal lateral ST depression.  Relevant CV Studies:  Lexiscan Myoview 07/19/2019: LVEF 57%.  No ischemia.  Echo 07/15/2019: LVEF greater than 55%.  Mild LVH.  Trivial TR.  Laboratory Data:  High Sensitivity Troponin:   Recent Labs  Lab 10/15/20 0958 10/15/20 1126  TROPONINIHS 266* 338*      Chemistry Recent Labs  Lab 10/15/20 0958  NA 134*  K 4.0  CL 100  CO2 23  GLUCOSE 156*  BUN 12  CREATININE 0.95  CALCIUM 9.0  GFRNONAA >60  ANIONGAP 11    Recent Labs  Lab 10/15/20 0958  PROT 7.6  ALBUMIN 4.3  AST 24  ALT 23  ALKPHOS 50  BILITOT 0.5   Hematology Recent Labs  Lab 10/15/20 0958  WBC 6.4  RBC 4.63  HGB 14.2  HCT 43.5  MCV 94.0  MCH 30.7  MCHC 32.6  RDW 12.8  PLT 285   BNPNo results for  input(s): BNP, PROBNP in the last 168 hours.  DDimer No results for input(s): DDIMER in the last 168 hours.   Radiology/Studies:  DG Chest Port 1 View  Result Date: 10/15/2020 CLINICAL DATA:  Chest pain for 3 weeks. EXAM: PORTABLE CHEST 1 VIEW COMPARISON:  02/22/2016 chest radiograph FINDINGS: Cardiomegaly is identified. Chronic peribronchial thickening again noted. There is no evidence of focal airspace disease, pulmonary edema, suspicious pulmonary nodule/mass, pleural effusion, or pneumothorax. No acute bony abnormalities are identified. IMPRESSION: Cardiomegaly without evidence of acute cardiopulmonary disease. Electronically Signed   By: Harmon Pier M.D.   On: 10/15/2020 10:12     Assessment and Plan:   # NSTEMI:  # Hyperlipidemia: Symptoms are concerning for angina.  High-sensitivity troponin was mildly elevated and has only been checked once.  We will repeat now.  Continue aspirin.  He is on Eliquis and took his last dose at 6 AM.  We will start heparin at 6 PM.  Plan for cardiac catheterization last case to on tomorrow.  He is currently chest pain-free.  He has been bradycardic.  Reduce carvedilol to 6.25 mg twice daily. Both rosuvastatin and simvastatin are listed on his medication list.  He thinks that he is taking both.  He does have a history of statin intolerance.  We will continue rosuvastatin.  We will also check an echocardiogram.  He does report some shortness of breath laying flat on his back.  He does not appear to be volume overloaded.  Check BNP.  # Essential hypertension: Home regimen includes amlodipine 10 mg daily, carvedilol 12.5 mg twice daily, hydralazine 25 mg 3 times a day, and lisinopril 40 mg daily.  His blood pressure has been a little elevated.  We will continue his home regimen  for now with the exception of reducing carvedilol to 6.25 mg twice daily due to bradycardia as above.  #PAF: Currently in sinus rhythm.  We will hold his Eliquis and start heparin as  above.  Reducing carvedilol as above.     CHA2DS2-VASc Score = 5  This indicates a 7.2% annual risk of stroke. The patient's score is based upon: CHF History: 1 HTN History: 0 Diabetes History: 0 Stroke History: 2 Vascular Disease History: 0 Age Score: 2 Gender Score: 0  # Diabetes:   He has been only on metformin at home.  Check hemoglobin A1c.  Hold Metformin in preparation for cath.  AC/HS glucose monitoring with insulin sliding scale.       TIMI Risk Score for Unstable Angina or Non-ST Elevation MI:   The patient's TIMI risk score is 4, which indicates a 20% risk of all cause mortality, new or recurrent myocardial infarction or need for urgent revascularization in the next 14 days.     Severity of Illness: The appropriate patient status for this patient is OBSERVATION. Observation status is judged to be reasonable and necessary in order to provide the required intensity of service to ensure the patient's safety. The patient's presenting symptoms, physical exam findings, and initial radiographic and laboratory data in the context of their medical condition is felt to place them at decreased risk for further clinical deterioration. Furthermore, it is anticipated that the patient will be medically stable for discharge from the hospital within 2 midnights of admission. The following factors support the patient status of observation.   " The patient's presenting symptoms include chest pain. " The physical exam findings include no concerns. " The initial radiographic and laboratory data are +troponin.     For questions or updates, please contact [facility] Please consult [email or web address] for contact info under     Signed, Chilton Si, MD  10/15/2020 4:20 PM

## 2020-10-15 NOTE — ED Notes (Signed)
Chest pain for about 3 weeks.  Not able to lay flat.  Chest pain increased last night.  Rates pain 0/10 but when he has pain it's 8-9/10.

## 2020-10-15 NOTE — ED Notes (Signed)
CRITICAL VALUE ALERT  Critical Value:  Troponin 338  Date & Time Notied:  1233 10/15/20  Provider Notified: zammitt  Orders Received/Actions taken:

## 2020-10-15 NOTE — ED Notes (Signed)
Pain relieved w NTG paste  3 down to 0    Given room number    Awaiting bed ready and transport

## 2020-10-15 NOTE — ED Notes (Signed)
Call from lab   Critical trop 266   Dr Hyacinth Meeker informed

## 2020-10-15 NOTE — ED Provider Notes (Signed)
[facility] EMERGENCY DEPARTMENT Provider Note   CSN: [id number] Arrival date & time: 10/15/20  1517     History Chief Complaint  Patient presents with  . Chest Pain    Herbert Torres is a 74 y.o. male.  HPI      This patient is a 74 year old male, he is a diabetic, taking oral medications, he also has hypertension and hyperlipidemia.  He no longer takes a statin medication because of side effects.  He had a stress test in July 2020 per the medical record review, this was done because of shortness of breath with exertion, it was a negative treadmill stress test.  He had been taking Eliquis because of paroxysmal atrial fibrillation.  The patient is unaware of the names of his medications at this time but believes he is still taking it.  He does report that over the last 2 to 3 weeks he has had an increase in the amount of chest pain, it is a heaviness across his chest and seems to radiate into the left chest, it is definitely worse with exertion and last night seem to be even worse as well.  He does not have shortness of breath with it at this time, he is not nauseated or diaphoretic and he has had no coughing or fevers.  He does report having seen his family doctor recently, they did an EKG and told her everything was okay, he has not had any blood work done and has not seen a cardiologist since July 2020 when he was seen for paroxysmal A. fib at the [facility].  The patient was told to take 4 baby aspirin before EMS arrived, he took these and was given a sublingual nitroglycerin by the paramedics who transported the patient to the hospital.  The nitroglycerin made his pain completely go away and he is currently pain-free.  Past Medical History:  Diagnosis Date  . Diabetes mellitus without complication (HCC)   . GERD (gastroesophageal reflux disease)   . HOH (hard of hearing)    AIDS  . Hyperlipidemia   . Hypertension     Patient Active Problem List   Diagnosis Date Noted    . NSTEMI (non-ST elevated myocardial infarction) (HCC) 10/15/2020  . Diabetes mellitus treated with oral medication (HCC)   . Essential hypertension   . Pure hypercholesterolemia     Past Surgical History:  Procedure Laterality Date  . APPENDECTOMY    . CATARACT EXTRACTION W/PHACO Right 02/26/2018   Procedure: CATARACT EXTRACTION PHACO AND INTRAOCULAR LENS PLACEMENT (IOC);  Surgeon: Nevada Crane, MD;  Location: [facility] ORS;  Service: Ophthalmology;  Laterality: Right;  fluid pak loT# [id number] H  exp10/31/2020 [place]   00:38.9 AP%   16.3 CDE   6.33  . COLONOSCOPY         Family History  Problem Relation Age of Onset  . Heart attack Mother   . Heart attack Father   . Heart attack Sister   . Heart attack Brother     Social History   Tobacco Use  . Smoking status: Former Games developer  . Smokeless tobacco: Never Used  Vaping Use  . Vaping Use: Never used  Substance Use Topics  . Alcohol use: No  . Drug use: Not on file    Home Medications Prior to Admission medications   Medication Sig Start Date End Date Taking? Authorizing Provider  amLODipine (NORVASC) 10 MG tablet Take 10 mg by mouth daily. for high blood pressure 02/08/16  Yes [provider]  apixaban (ELIQUIS) 5 MG TABS tablet Take 5 mg by mouth in the morning and at bedtime. 06/17/19  Yes [provider]  carvedilol (COREG) 12.5 MG tablet Take 12.5 mg by mouth 2 (two) times daily. 01/21/16  Yes [provider]  hydrALAZINE (APRESOLINE) 25 MG tablet Take 25 mg by mouth 3 (three) times daily.   Yes [provider]  lisinopril (PRINIVIL,ZESTRIL) 40 MG tablet Take 40 mg by mouth daily. 12/19/15  Yes [provider]  metFORMIN (GLUCOPHAGE) 1000 MG tablet Take 1,000 mg by mouth 2 (two) times daily. 02/08/16  Yes [provider]  Omega-3 Fatty Acids ([employer] NATURAL FISH OIL) 1000 MG CAPS Take 1 capsule by mouth 2 (two) times daily. 02/15/16  Yes [provider]  omeprazole  (PRILOSEC) 20 MG capsule Take 20 mg by mouth daily. 02/08/16  Yes [provider]    Allergies    Patient has no known allergies.  Review of Systems   Review of Systems  All other systems reviewed and are negative.   Physical Exam Updated Vital Signs BP (!) 143/67 (BP Location: Right Arm)   Pulse 70   Temp 98 F (36.7 C) (Oral)   Resp 19   Ht 1.829 m (6')   Wt 107 kg   SpO2 99%   BMI 31.99 kg/m   Physical Exam Vitals and nursing note reviewed.  Constitutional:      General: He is not in acute distress.    Appearance: He is well-developed.  HENT:     Head: Normocephalic and atraumatic.     Mouth/Throat:     Pharynx: No oropharyngeal exudate.  Eyes:     General: No scleral icterus.       Right eye: No discharge.        Left eye: No discharge.     Conjunctiva/sclera: Conjunctivae normal.     Pupils: Pupils are equal, round, and reactive to light.  Neck:     Thyroid: No thyromegaly.     Vascular: No JVD.  Cardiovascular:     Rate and Rhythm: Normal rate and regular rhythm.     Heart sounds: Normal heart sounds. No murmur heard.  No friction rub. No gallop.   Pulmonary:     Effort: Pulmonary effort is normal. No respiratory distress.     Breath sounds: Normal breath sounds. No wheezing or rales.  Abdominal:     General: Bowel sounds are normal. There is no distension.     Palpations: Abdomen is soft. There is no mass.     Tenderness: There is no abdominal tenderness.  Musculoskeletal:        General: No tenderness. Normal range of motion.     Cervical back: Normal range of motion and neck supple.  Lymphadenopathy:     Cervical: No cervical adenopathy.  Skin:    General: Skin is warm and dry.     Findings: No erythema or rash.  Neurological:     Mental Status: He is alert.     Coordination: Coordination normal.  Psychiatric:        Behavior: Behavior normal.     ED Results / Procedures / Treatments   Labs (all labs ordered are listed, but only  abnormal results are displayed) Labs Reviewed  COMPREHENSIVE METABOLIC PANEL - Abnormal; Notable for the following components:      Result Value   Sodium 134 (*)    Glucose, Bld 156 (*)    All other components  within normal limits  HEPARIN LEVEL (UNFRACTIONATED) - Abnormal; Notable for the following components:   Heparin Unfractionated >2.20 (*)    All other components within normal limits  BRAIN NATRIURETIC PEPTIDE - Abnormal; Notable for the following components:   B Natriuretic Peptide 154.0 (*)    All other components within normal limits  APTT - Abnormal; Notable for the following components:   aPTT 43 (*)    All other components within normal limits  HEPARIN LEVEL (UNFRACTIONATED) - Abnormal; Notable for the following components:   Heparin Unfractionated 0.83 (*)    All other components within normal limits  CBC - Abnormal; Notable for the following components:   Hemoglobin 12.9 (*)    HCT 38.6 (*)    All other components within normal limits  LIPID PANEL - Abnormal; Notable for the following components:   Cholesterol 212 (*)    Triglycerides 228 (*)    HDL 37 (*)    VLDL 46 (*)    LDL Cholesterol 129 (*)    All other components within normal limits  BASIC METABOLIC PANEL - Abnormal; Notable for the following components:   Glucose, Bld 155 (*)    All other components within normal limits  GLUCOSE, CAPILLARY - Abnormal; Notable for the following components:   Glucose-Capillary 131 (*)    All other components within normal limits  GLUCOSE, CAPILLARY - Abnormal; Notable for the following components:   Glucose-Capillary 141 (*)    All other components within normal limits  CBG MONITORING, ED - Abnormal; Notable for the following components:   Glucose-Capillary 147 (*)    All other components within normal limits  TROPONIN I (HIGH SENSITIVITY) - Abnormal; Notable for the following components:   Troponin I (High Sensitivity) 266 (*)    All other components within normal  limits  TROPONIN I (HIGH SENSITIVITY) - Abnormal; Notable for the following components:   Troponin I (High Sensitivity) 338 (*)    All other components within normal limits  TROPONIN I (HIGH SENSITIVITY) - Abnormal; Notable for the following components:   Troponin I (High Sensitivity) 458 (*)    All other components within normal limits  TROPONIN I (HIGH SENSITIVITY) - Abnormal; Notable for the following components:   Troponin I (High Sensitivity) 423 (*)    All other components within normal limits  SARS CORONAVIRUS 2 BY RT PCR (HOSPITAL ORDER, PERFORMED IN [facility])  CBC WITH DIFFERENTIAL/PLATELET  PROTIME-INR  APTT  HEMOGLOBIN A1C  HEPARIN LEVEL (UNFRACTIONATED)  APTT    EKG EKG Interpretation  Date/Time:  Sunday October 15 2020 09:38:05 EDT Ventricular Rate:  58 PR Interval:    QRS Duration: 88 QT Interval:  414 QTC Calculation: 407 R Axis:   56 Text Interpretation: Sinus rhythm Normal ECG No old tracing to compare Confirmed by Eber Hong ([id number]) on 10/15/2020 9:49:25 AM   Radiology DG Chest Port 1 View  Result Date: 10/15/2020 CLINICAL DATA:  Chest pain for 3 weeks. EXAM: PORTABLE CHEST 1 VIEW COMPARISON:  02/22/2016 chest radiograph FINDINGS: Cardiomegaly is identified. Chronic peribronchial thickening again noted. There is no evidence of focal airspace disease, pulmonary edema, suspicious pulmonary nodule/mass, pleural effusion, or pneumothorax. No acute bony abnormalities are identified. IMPRESSION: Cardiomegaly without evidence of acute cardiopulmonary disease. Electronically Signed   By: Harmon Pier M.D.   On: 10/15/2020 10:12    Procedures .Critical Care Performed by: Eber Hong, MD Authorized by: Eber Hong, MD   Critical care provider statement:    Critical  care time (minutes):  35   Critical care time was exclusive of:  Separately billable procedures and treating other patients and teaching time   Critical care was necessary to  treat or prevent imminent or life-threatening deterioration of the following conditions:  Cardiac failure   Critical care was time spent personally by me on the following activities:  Blood draw for specimens, development of treatment plan with patient or surrogate, discussions with consultants, evaluation of patient's response to treatment, examination of patient, obtaining history from patient or surrogate, ordering and performing treatments and interventions, ordering and review of laboratory studies, ordering and review of radiographic studies, pulse oximetry, re-evaluation of patient's condition and review of old charts   (including critical care time)  Medications Ordered in ED Medications  heparin ADULT infusion 100 units/mL (25000 units/246mL sodium chloride 0.45%) (1,400 Units/hr Intravenous Rate/Dose Change 10/16/20 0319)  aspirin chewable tablet 324 mg (324 mg Oral Not Given 10/15/20 1641)    Or  aspirin suppository 300 mg ( Rectal See Alternative 10/15/20 1641)  aspirin EC tablet 81 mg (has no administration in time range)  nitroGLYCERIN (NITROSTAT) SL tablet 0.4 mg (has no administration in time range)  acetaminophen (TYLENOL) tablet 650 mg (has no administration in time range)  ondansetron (ZOFRAN) injection 4 mg (has no administration in time range)  sodium chloride flush (NS) 0.9 % injection 3 mL (3 mLs Intravenous Given 10/15/20 2310)  sodium chloride flush (NS) 0.9 % injection 3 mL (has no administration in time range)  0.9 %  sodium chloride infusion (250 mLs Intravenous New Bag/Given 10/16/20 0529)  0.9 %  sodium chloride infusion (has no administration in time range)  amLODipine (NORVASC) tablet 10 mg (has no administration in time range)  hydrALAZINE (APRESOLINE) tablet 25 mg (25 mg Oral Given 10/15/20 2309)  lisinopril (ZESTRIL) tablet 40 mg (has no administration in time range)  pantoprazole (PROTONIX) EC tablet 40 mg (40 mg Oral Given 10/15/20 1749)  carvedilol (COREG)  tablet 6.25 mg (6.25 mg Oral Given 10/15/20 1749)  rosuvastatin (CRESTOR) tablet 20 mg (20 mg Oral Given 10/15/20 1749)  insulin aspart (novoLOG) injection 0-15 Units (has no administration in time range)  insulin aspart (novoLOG) injection 0-5 Units (0 Units Subcutaneous Not Given 10/15/20 2309)  nitroGLYCERIN (NITROGLYN) 2 % ointment 0.5 inch (0.5 inches Topical Given 10/15/20 1143)  aspirin chewable tablet 81 mg (81 mg Oral Given 10/16/20 0528)    ED Course  I have reviewed the triage vital signs and the nursing notes.  Pertinent labs & imaging results that were available during my care of the patient were reviewed by me and considered in my medical decision making (see chart for details).    MDM Rules/Calculators/A&P                          Thankfully the patient's EKG is unremarkable showing no signs of ischemia or arrhythmia.  He is in sinus rhythm with no ST elevation.  His symptoms have resolved albeit with nitroglycerin and his exertional nature of his symptoms is definitely concerning for progressive angina.  Will obtain a troponin discussed with cardiology, blood pressure is currently 155/98, pulse of 63 thus I will avoid a beta-blocker at this time.  The patient's lab work shows normal blood counts, normal INR, and a normal metabolic panel other than mild hyperglycemia.  His chest x-ray does show some cardiomegaly but there is no other acute disease.  The patient's troponin did come  back elevated over 260 suggestive of a non-ST elevation MI, heparin was started, the patient has had his aspirin this morning and at this time has just a touch of pain in the left side of his chest but is extremely mild.  I will discuss the case with cardiology, I requested that pharmacy start heparin drip for acute coronary syndrome, the patient is critically ill  I discussed the Care with Dr. Duke Salvia who has accepted the patient in transfer  Final Clinical Impression(s) / ED Diagnoses Final  diagnoses:  NSTEMI (non-ST elevated myocardial infarction) [facility])      Eber Hong, MD 10/16/20 [phone]

## 2020-10-16 ENCOUNTER — Inpatient Hospital Stay (HOSPITAL_COMMUNITY): Payer: Medicare Other

## 2020-10-16 ENCOUNTER — Encounter (HOSPITAL_COMMUNITY): Payer: Self-pay | Admitting: Cardiology

## 2020-10-16 ENCOUNTER — Encounter (HOSPITAL_COMMUNITY)
Admission: EM | Disposition: A | Discharge status: Home Health | Payer: Self-pay | Source: Home / Self Care | Attending: Thoracic Surgery (Cardiothoracic Vascular Surgery)

## 2020-10-16 DIAGNOSIS — E7800 Pure hypercholesterolemia, unspecified: Secondary | ICD-10-CM | POA: Diagnosis not present

## 2020-10-16 DIAGNOSIS — I2510 Atherosclerotic heart disease of native coronary artery without angina pectoris: Secondary | ICD-10-CM

## 2020-10-16 DIAGNOSIS — E119 Type 2 diabetes mellitus without complications: Secondary | ICD-10-CM | POA: Diagnosis not present

## 2020-10-16 DIAGNOSIS — I10 Essential (primary) hypertension: Secondary | ICD-10-CM | POA: Diagnosis not present

## 2020-10-16 DIAGNOSIS — R079 Chest pain, unspecified: Secondary | ICD-10-CM | POA: Diagnosis not present

## 2020-10-16 DIAGNOSIS — I25110 Atherosclerotic heart disease of native coronary artery with unstable angina pectoris: Secondary | ICD-10-CM

## 2020-10-16 DIAGNOSIS — I214 Non-ST elevation (NSTEMI) myocardial infarction: Secondary | ICD-10-CM

## 2020-10-16 DIAGNOSIS — I4821 Permanent atrial fibrillation: Secondary | ICD-10-CM

## 2020-10-16 DIAGNOSIS — I428 Other cardiomyopathies: Secondary | ICD-10-CM | POA: Diagnosis not present

## 2020-10-16 HISTORY — PX: LEFT HEART CATH AND CORONARY ANGIOGRAPHY: CATH118249

## 2020-10-16 LAB — CBC
HCT: 38.6 % — ABNORMAL LOW (ref 39.0–52.0)
Hemoglobin: 12.9 g/dL — ABNORMAL LOW (ref 13.0–17.0)
MCH: 30.6 pg (ref 26.0–34.0)
MCHC: 33.4 g/dL (ref 30.0–36.0)
MCV: 91.5 fL (ref 80.0–100.0)
Platelets: 265 10*3/uL (ref 150–400)
RBC: 4.22 MIL/uL (ref 4.22–5.81)
RDW: 12.7 % (ref 11.5–15.5)
WBC: 7.2 10*3/uL (ref 4.0–10.5)
nRBC: 0 % (ref 0.0–0.2)

## 2020-10-16 LAB — ECHOCARDIOGRAM COMPLETE
AR max vel: 2.6 cm2
AV Area VTI: 2.47 cm2
AV Area mean vel: 2.37 cm2
AV Mean grad: 4.3 mmHg
AV Peak grad: 7.4 mmHg
Ao pk vel: 1.36 m/s
Area-P 1/2: 2.22 cm2
Height: 72 in
S' Lateral: 3 cm
Weight: 3774.4 oz

## 2020-10-16 LAB — LIPID PANEL
Cholesterol: 212 mg/dL — ABNORMAL HIGH (ref 0–200)
HDL: 37 mg/dL — ABNORMAL LOW (ref >40)
LDL Cholesterol: 129 mg/dL — ABNORMAL HIGH (ref 0–99)
Total CHOL/HDL Ratio: 5.7 RATIO
Triglycerides: 228 mg/dL — ABNORMAL HIGH (ref <150)
VLDL: 46 mg/dL — ABNORMAL HIGH (ref 0–40)

## 2020-10-16 LAB — GLUCOSE, CAPILLARY
Glucose-Capillary: 159 mg/dL — ABNORMAL HIGH (ref 70–99)
Glucose-Capillary: 136 mg/dL — ABNORMAL HIGH (ref 70–99)
Glucose-Capillary: 167 mg/dL — ABNORMAL HIGH (ref 70–99)
Glucose-Capillary: 144 mg/dL — ABNORMAL HIGH (ref 70–99)

## 2020-10-16 LAB — BASIC METABOLIC PANEL
Anion gap: 10 (ref 5–15)
BUN: 10 mg/dL (ref 8–23)
CO2: 25 mmol/L (ref 22–32)
Calcium: 8.9 mg/dL (ref 8.9–10.3)
Chloride: 103 mmol/L (ref 98–111)
Creatinine, Ser: 1.08 mg/dL (ref 0.61–1.24)
GFR, Estimated: >60 mL/min (ref >60)
Glucose, Bld: 155 mg/dL — ABNORMAL HIGH (ref 70–99)
Potassium: 3.5 mmol/L (ref 3.5–5.1)
Sodium: 138 mmol/L (ref 135–145)

## 2020-10-16 LAB — SARS CORONAVIRUS 2 BY RT PCR (HOSPITAL ORDER, PERFORMED IN ~~LOC~~ HOSPITAL LAB): SARS Coronavirus 2: NEGATIVE

## 2020-10-16 LAB — APTT: aPTT: 43 seconds — ABNORMAL HIGH (ref 24–36)

## 2020-10-16 LAB — HEPARIN LEVEL (UNFRACTIONATED): Heparin Unfractionated: 0.83 IU/mL — ABNORMAL HIGH (ref 0.30–0.70)

## 2020-10-16 SURGERY — LEFT HEART CATH AND CORONARY ANGIOGRAPHY
Anesthesia: LOCAL

## 2020-10-16 MED ORDER — HEPARIN SODIUM (PORCINE) 1000 UNIT/ML IJ SOLN
INTRAMUSCULAR | Status: AC
  Filled 2020-10-16: qty 1

## 2020-10-16 MED ORDER — HEPARIN (PORCINE) IN NACL 1000-0.9 UT/500ML-% IV SOLN
INTRAVENOUS | Status: DC | PRN
  Administered 2020-10-16: 500 mL

## 2020-10-16 MED ORDER — LIDOCAINE HCL (PF) 1 % IJ SOLN
INTRAMUSCULAR | Status: AC
  Filled 2020-10-16: qty 30

## 2020-10-16 MED ORDER — MIDAZOLAM HCL 2 MG/2ML IJ SOLN
INTRAMUSCULAR | Status: AC
  Filled 2020-10-16: qty 2

## 2020-10-16 MED ORDER — SODIUM CHLORIDE 0.9% FLUSH
3.0000 mL | Freq: Two times a day (BID) | INTRAVENOUS | Status: DC
  Administered 2020-10-16 – 2020-10-17 (×2): 3 mL via INTRAVENOUS

## 2020-10-16 MED ORDER — VERAPAMIL HCL 2.5 MG/ML IV SOLN
INTRAVENOUS | Status: AC
  Filled 2020-10-16: qty 2

## 2020-10-16 MED ORDER — FENTANYL CITRATE (PF) 100 MCG/2ML IJ SOLN
INTRAMUSCULAR | Status: DC | PRN
  Administered 2020-10-16: 25 ug via INTRAVENOUS

## 2020-10-16 MED ORDER — PERFLUTREN LIPID MICROSPHERE
1.0000 mL | INTRAVENOUS | Status: AC | PRN
  Administered 2020-10-16: 3 mL via INTRAVENOUS
  Filled 2020-10-16: qty 10

## 2020-10-16 MED ORDER — LABETALOL HCL 5 MG/ML IV SOLN: 10.0000 mg | INTRAVENOUS | Status: AC | PRN

## 2020-10-16 MED ORDER — VERAPAMIL HCL 2.5 MG/ML IV SOLN
INTRAVENOUS | Status: DC | PRN
  Administered 2020-10-16: 10 mL via INTRA_ARTERIAL

## 2020-10-16 MED ORDER — MIDAZOLAM HCL 2 MG/2ML IJ SOLN
INTRAMUSCULAR | Status: DC | PRN
  Administered 2020-10-16: 1 mg via INTRAVENOUS

## 2020-10-16 MED ORDER — HEPARIN (PORCINE) 25000 UT/250ML-% IV SOLN
1500.0000 [IU]/h | INTRAVENOUS | Status: DC
  Administered 2020-10-16: 1400 [IU]/h via INTRAVENOUS
  Administered 2020-10-17: 1500 [IU]/h via INTRAVENOUS
  Filled 2020-10-16 (×2): qty 250

## 2020-10-16 MED ORDER — SODIUM CHLORIDE 0.9 % IV SOLN: INTRAVENOUS | Status: AC

## 2020-10-16 MED ORDER — SODIUM CHLORIDE 0.9 % IV SOLN
250.0000 mL | INTRAVENOUS | Status: DC | PRN
  Administered 2020-10-16: 250 mL via INTRAVENOUS

## 2020-10-16 MED ORDER — SODIUM CHLORIDE 0.9% FLUSH: 3.0000 mL | INTRAVENOUS | Status: DC | PRN

## 2020-10-16 MED ORDER — HYDRALAZINE HCL 50 MG PO TABS
50.0000 mg | ORAL_TABLET | Freq: Three times a day (TID) | ORAL | Status: DC
  Administered 2020-10-16 – 2020-10-17 (×5): 50 mg via ORAL
  Filled 2020-10-16 (×5): qty 1

## 2020-10-16 MED ORDER — LIDOCAINE HCL (PF) 1 % IJ SOLN
INTRAMUSCULAR | Status: DC | PRN
  Administered 2020-10-16: 3 mL

## 2020-10-16 MED ORDER — EZETIMIBE 10 MG PO TABS
10.0000 mg | ORAL_TABLET | Freq: Every day | ORAL | Status: DC
  Administered 2020-10-16 – 2020-10-29 (×13): 10 mg via ORAL
  Filled 2020-10-16 (×13): qty 1

## 2020-10-16 MED ORDER — IOHEXOL 350 MG/ML SOLN
INTRAVENOUS | Status: DC | PRN
  Administered 2020-10-16: 80 mL

## 2020-10-16 MED ORDER — HEPARIN SODIUM (PORCINE) 1000 UNIT/ML IJ SOLN
INTRAMUSCULAR | Status: DC | PRN
  Administered 2020-10-16: 5000 [IU] via INTRAVENOUS

## 2020-10-16 MED ORDER — FENTANYL CITRATE (PF) 100 MCG/2ML IJ SOLN
INTRAMUSCULAR | Status: AC
  Filled 2020-10-16: qty 2

## 2020-10-16 MED ORDER — HEPARIN (PORCINE) IN NACL 1000-0.9 UT/500ML-% IV SOLN
INTRAVENOUS | Status: AC
  Filled 2020-10-16: qty 1000

## 2020-10-16 SURGICAL SUPPLY — 11 items

## 2020-10-16 NOTE — Progress Notes (Signed)
ANTICOAGULATION CONSULT NOTE   Pharmacy Consult for Heparin Indication: chest pain/ACS  No Known Allergies  Patient Measurements: Height: 6' (182.9 cm) Weight: 108.7 kg (239 lb 10.2 oz) IBW/kg (Calculated) : 77.6 HEPARIN DW (KG): 100.5  Vital Signs: Temp: 97.7 F (36.5 C) (10/25 0022) Temp Source: Oral (10/25 0022) BP: 125/67 (10/25 0022) Pulse Rate: 68 (10/25 0022)  Labs: Recent Labs    10/15/20 0958 10/15/20 0958 10/15/20 1126 10/15/20 1656 10/15/20 1926 10/16/20 0148  HGB 14.2  --   --   --   --  12.9*  HCT 43.5  --   --   --   --  38.6*  PLT 285  --   --   --   --  265  APTT 33  --   --   --   --  43*  LABPROT 13.6  --   --   --   --   --   INR 1.1  --   --   --   --   --   HEPARINUNFRC >2.20*  --   --   --   --  0.83*  CREATININE 0.95  --   --   --   --  1.08  TROPONINIHS 266*   < > 338* 423* 458*  --    < > = values in this interval not displayed.    Estimated Creatinine Clearance: 75.2 mL/min (by C-G formula based on SCr of 1.08 mg/dL).   Medical History: Past Medical History:  Diagnosis Date  . Diabetes mellitus without complication (HCC)   . GERD (gastroesophageal reflux disease)   . HOH (hard of hearing)    AIDS  . Hyperlipidemia   . Hypertension     Medications:  See med rec  Assessment: Patient presented with chest pain. He is chronically on eliquis for afib and last dose was taken 10/24 this AM at 0600. Pharmacy asked to start heparin. Will start at 1800 without a bolus and order labs including APTT and anti-xa levels.  10/25 AM update:  APTT low No issues per RN  Goal of Therapy:  Heparin level 0.3-0.7 units/ml aPTT 66-102 seconds Monitor platelets by anticoagulation protocol: Yes   Plan:  Inc heparin to 1400 units/hr 1200 aPTT/heparin level  Monitor for bleeding  Abran Duke, PharmD, BCPS Clinical Pharmacist Phone: [phone]

## 2020-10-16 NOTE — Progress Notes (Signed)
ANTICOAGULATION CONSULT NOTE   Pharmacy Consult for Heparin Indication: chest pain/ACS  Allergies  Allergen Reactions  . Atorvastatin Other (See Comments)    C/o myalgias in knees    Patient Measurements: Height: 6' (182.9 cm) Weight: 107 kg (235 lb 14.4 oz) IBW/kg (Calculated) : 77.6 HEPARIN DW (KG): 100.5  Vital Signs: Temp: 97.7 F (36.5 C) (10/25 1726) Temp Source: Oral (10/25 1726) BP: 141/68 (10/25 1726) Pulse Rate: 68 (10/25 1726)  Labs: Recent Labs    10/15/20 0958 10/15/20 0958 10/15/20 1126 10/15/20 1656 10/15/20 1926 10/16/20 0148  HGB 14.2  --   --   --   --  12.9*  HCT 43.5  --   --   --   --  38.6*  PLT 285  --   --   --   --  265  APTT 33  --   --   --   --  43*  LABPROT 13.6  --   --   --   --   --   INR 1.1  --   --   --   --   --   HEPARINUNFRC >2.20*  --   --   --   --  0.83*  CREATININE 0.95  --   --   --   --  1.08  TROPONINIHS 266*   < > 338* 423* 458*  --    < > = values in this interval not displayed.    Estimated Creatinine Clearance: 74.7 mL/min (by C-G formula based on SCr of 1.08 mg/dL).   Medical History: Past Medical History:  Diagnosis Date  . Coronary artery disease   . Diabetes mellitus without complication (HCC)   . GERD (gastroesophageal reflux disease)   . HOH (hard of hearing)    AIDS  . Hyperlipidemia   . Hypertension     Medications:  See med rec  Assessment: Patient presented with chest pain. He is chronically on eliquis for afib and last dose was taken 10/24 this AM at 0600. Pharmacy asked to start heparin. Will start at 1800 without a bolus and order labs including APTT and anti-xa levels.  Heparin off for cath lab, pharmacy asked to resume tonight (8 hrs post sheath).  Spoke to RN, TR band has been off for about 1 hr and site looks stable.  Goal of Therapy:  Heparin level 0.3-0.7 units/ml aPTT 66-102 seconds Monitor platelets by anticoagulation protocol: Yes   Plan:  Restart IV heparin tonight at 2100  pm at rate of 1400 units/hr. Daily heparin level and CBC.  Reece Leader, Colon Flattery, BCCP Clinical Pharmacist  10/16/2020 6:51 PM   [facility] pharmacy phone numbers are listed on [email or web address]

## 2020-10-16 NOTE — H&P (View-Only) (Signed)
Progress Note  Patient Name: Herbert Torres Date of Encounter: 10/16/2020  [facility] Cardiologist: Chilton Si, MD   Subjective   No chest pain overnight.   Inpatient Medications    Scheduled Meds: . amLODipine  10 mg Oral Daily  . aspirin  324 mg Oral NOW   Or  . aspirin  300 mg Rectal NOW  . aspirin EC  81 mg Oral Daily  . carvedilol  6.25 mg Oral BID WC  . ezetimibe  10 mg Oral Daily  . hydrALAZINE  50 mg Oral Q8H  . insulin aspart  0-15 Units Subcutaneous TID WC  . insulin aspart  0-5 Units Subcutaneous QHS  . lisinopril  40 mg Oral Daily  . pantoprazole  40 mg Oral Daily  . rosuvastatin  20 mg Oral Daily  . sodium chloride flush  3 mL Intravenous Q12H   Continuous Infusions: . sodium chloride 250 mL (10/16/20 0529)  . sodium chloride    . heparin 1,400 Units/hr (10/16/20 0319)   PRN Meds: sodium chloride, acetaminophen, nitroGLYCERIN, ondansetron (ZOFRAN) IV, sodium chloride flush   Vital Signs    Vitals:   10/15/20 2017 10/15/20 2143 10/16/20 0022 10/16/20 0436  BP: (!) 122/53  125/67 (!) 143/67  Pulse: (!) 58  68 70  Resp: 16  15 19   Temp: 97.7 F (36.5 C)  97.7 F (36.5 C) 98 F (36.7 C)  TempSrc: Oral  Oral Oral  SpO2: 98%  98% 99%  Weight:  108.7 kg  107 kg  Height:        Intake/Output Summary (Last 24 hours) at 10/16/2020 0800 Last data filed at 10/16/2020 0434 Gross per 24 hour  Intake 3 ml  Output 700 ml  Net -697 ml   Last 3 Weights 10/16/2020 10/15/2020 10/15/2020  Weight (lbs) 235 lb 14.4 oz 239 lb 10.2 oz 239 lb 9.6 oz  Weight (kg) 107.004 kg 108.7 kg 108.682 kg      Telemetry    SR with PACs - Personally Reviewed  ECG    SR with PACs, TWI in lead III- Personally Reviewed  Physical Exam  Pleasant older male GEN: No acute distress.   Neck: No JVD Cardiac: Irreg, no murmurs, rubs, or gallops.  Respiratory: Clear to auscultation bilaterally. GI: Soft, nontender, non-distended  MS: No edema; No  deformity. Neuro:  Nonfocal  Psych: Normal affect   Labs    High Sensitivity Troponin:   Recent Labs  Lab 10/15/20 0958 10/15/20 1126 10/15/20 1656 10/15/20 1926  TROPONINIHS 266* 338* 423* 458*      Chemistry Recent Labs  Lab 10/15/20 0958 10/16/20 0148  NA 134* 138  K 4.0 3.5  CL 100 103  CO2 23 25  GLUCOSE 156* 155*  BUN 12 10  CREATININE 0.95 1.08  CALCIUM 9.0 8.9  PROT 7.6  --   ALBUMIN 4.3  --   AST 24  --   ALT 23  --   ALKPHOS 50  --   BILITOT 0.5  --   GFRNONAA >60 >60  ANIONGAP 11 10     Hematology Recent Labs  Lab 10/15/20 0958 10/16/20 0148  WBC 6.4 7.2  RBC 4.63 4.22  HGB 14.2 12.9*  HCT 43.5 38.6*  MCV 94.0 91.5  MCH 30.7 30.6  MCHC 32.6 33.4  RDW 12.8 12.7  PLT 285 265    BNP Recent Labs  Lab 10/15/20 1656  BNP 154.0*     DDimer No results for input(s):  DDIMER in the last 168 hours.   Radiology    DG Chest Port 1 View  Result Date: 10/15/2020 CLINICAL DATA:  Chest pain for 3 weeks. EXAM: PORTABLE CHEST 1 VIEW COMPARISON:  02/22/2016 chest radiograph FINDINGS: Cardiomegaly is identified. Chronic peribronchial thickening again noted. There is no evidence of focal airspace disease, pulmonary edema, suspicious pulmonary nodule/mass, pleural effusion, or pneumothorax. No acute bony abnormalities are identified. IMPRESSION: Cardiomegaly without evidence of acute cardiopulmonary disease. Electronically Signed   By: Harmon Pier M.D.   On: 10/15/2020 10:12    Cardiac Studies   N/a   Patient Profile     74 y.o. male with PMH of Afib, prior tobacco use, DM, HTN and HLD who presented with chest pain and admitted with NSTEMI.   Assessment & Plan    1. NSTEMI: hsTn peaked at 458, remains on IV heparin. Planned for cardiac cath today. Remains chest pain free.  -- continue ASA, statin, BB  2. HLD: had both rosuvastatin/simvastatin listed on home meds list. Possibly taking both prior to admission? In talking with patient he reports  issues with both Lipitor and Crestor 2/2 myalgias prior to admission. Currently on crestor 20mg  daily started on admission, he is agreeable to continue this as this time -- LDL 129 -- start Zetia, will refer to lipid clinic at discharge  3. HTN: blood pressures remain elevated.  -- continue coreg 6.25mg  BID(this was reduced on admission 2/2 bradycardia) -- further increase hydralazine to 50mg  TID  4. PAF: currently in SR with PACs, Eliquis has been held with plans for cath. Chadsvasc 5 -- remains on IV heparin.   5. DM: on metformin at home.  -- Hgb A1c pending -- SSI while inpatient -- consider SGLT2  For questions or updates, please contact [facility] Please consult [email or web address] for contact info under        Signed, , NP  10/16/2020, 8:00 AM

## 2020-10-16 NOTE — Progress Notes (Signed)
  Echocardiogram 2D Echocardiogram has been performed with Definity.  Gerda Diss 10/16/2020, 4:27 PM

## 2020-10-16 NOTE — Progress Notes (Signed)
 Progress Note  Patient Name: Herbert Torres Date of Encounter: 10/16/2020  [facility] Cardiologist: Tiffany [place], MD   Subjective   No chest pain overnight.   Inpatient Medications    Scheduled Meds: . amLODipine  10 mg Oral Daily  . aspirin  324 mg Oral NOW   Or  . aspirin  300 mg Rectal NOW  . aspirin EC  81 mg Oral Daily  . carvedilol  6.25 mg Oral BID WC  . ezetimibe  10 mg Oral Daily  . hydrALAZINE  50 mg Oral Q8H  . insulin aspart  0-15 Units Subcutaneous TID WC  . insulin aspart  0-5 Units Subcutaneous QHS  . lisinopril  40 mg Oral Daily  . pantoprazole  40 mg Oral Daily  . rosuvastatin  20 mg Oral Daily  . sodium chloride flush  3 mL Intravenous Q12H   Continuous Infusions: . sodium chloride 250 mL (10/16/20 0529)  . sodium chloride    . heparin 1,400 Units/hr (10/16/20 0319)   PRN Meds: sodium chloride, acetaminophen, nitroGLYCERIN, ondansetron (ZOFRAN) IV, sodium chloride flush   Vital Signs    Vitals:   10/15/20 2017 10/15/20 2143 10/16/20 0022 10/16/20 0436  BP: (!) 122/53  125/67 (!) 143/67  Pulse: (!) 58  68 70  Resp: 16  15 19  Temp: 97.7 F (36.5 C)  97.7 F (36.5 C) 98 F (36.7 C)  TempSrc: Oral  Oral Oral  SpO2: 98%  98% 99%  Weight:  108.7 kg  107 kg  Height:        Intake/Output Summary (Last 24 hours) at 10/16/2020 0800 Last data filed at 10/16/2020 0434 Gross per 24 hour  Intake 3 ml  Output 700 ml  Net -697 ml   Last 3 Weights 10/16/2020 10/15/2020 10/15/2020  Weight (lbs) 235 lb 14.4 oz 239 lb 10.2 oz 239 lb 9.6 oz  Weight (kg) 107.004 kg 108.7 kg 108.682 kg      Telemetry    SR with PACs - Personally Reviewed  ECG    SR with PACs, TWI in lead III- Personally Reviewed  Physical Exam  Pleasant older male GEN: No acute distress.   Neck: No JVD Cardiac: Irreg, no murmurs, rubs, or gallops.  Respiratory: Clear to auscultation bilaterally. GI: Soft, nontender, non-distended  MS: No edema; No  deformity. Neuro:  Nonfocal  Psych: Normal affect   Labs    High Sensitivity Troponin:   Recent Labs  Lab 10/15/20 0958 10/15/20 1126 10/15/20 1656 10/15/20 1926  TROPONINIHS 266* 338* 423* 458*      Chemistry Recent Labs  Lab 10/15/20 0958 10/16/20 0148  NA 134* 138  K 4.0 3.5  CL 100 103  CO2 23 25  GLUCOSE 156* 155*  BUN 12 10  CREATININE 0.95 1.08  CALCIUM 9.0 8.9  PROT 7.6  --   ALBUMIN 4.3  --   AST 24  --   ALT 23  --   ALKPHOS 50  --   BILITOT 0.5  --   GFRNONAA >60 >60  ANIONGAP 11 10     Hematology Recent Labs  Lab 10/15/20 0958 10/16/20 0148  WBC 6.4 7.2  RBC 4.63 4.22  HGB 14.2 12.9*  HCT 43.5 38.6*  MCV 94.0 91.5  MCH 30.7 30.6  MCHC 32.6 33.4  RDW 12.8 12.7  PLT 285 265    BNP Recent Labs  Lab 10/15/20 1656  BNP 154.0*     DDimer No results for input(s):   DDIMER in the last 168 hours.   Radiology    DG Chest Port 1 View  Result Date: 10/15/2020 CLINICAL DATA:  Chest pain for 3 weeks. EXAM: PORTABLE CHEST 1 VIEW COMPARISON:  02/22/2016 chest radiograph FINDINGS: Cardiomegaly is identified. Chronic peribronchial thickening again noted. There is no evidence of focal airspace disease, pulmonary edema, suspicious pulmonary nodule/mass, pleural effusion, or pneumothorax. No acute bony abnormalities are identified. IMPRESSION: Cardiomegaly without evidence of acute cardiopulmonary disease. Electronically Signed   By: Harmon Pier M.D.   On: 10/15/2020 10:12    Cardiac Studies   N/a   Patient Profile     74 y.o. male with PMH of Afib, prior tobacco use, DM, HTN and HLD who presented with chest pain and admitted with NSTEMI.   Assessment & Plan    1. NSTEMI: hsTn peaked at 458, remains on IV heparin. Planned for cardiac cath today. Remains chest pain free.  -- continue ASA, statin, BB  2. HLD: had both rosuvastatin/simvastatin listed on home meds list. Possibly taking both prior to admission? In talking with patient he reports  issues with both Lipitor and Crestor 2/2 myalgias prior to admission. Currently on crestor 20mg  daily started on admission, he is agreeable to continue this as this time -- LDL 129 -- start Zetia, will refer to lipid clinic at discharge  3. HTN: blood pressures remain elevated.  -- continue coreg 6.25mg  BID(this was reduced on admission 2/2 bradycardia) -- further increase hydralazine to 50mg  TID  4. PAF: currently in SR with PACs, Eliquis has been held with plans for cath. Chadsvasc 5 -- remains on IV heparin.   5. DM: on metformin at home.  -- Hgb A1c pending -- SSI while inpatient -- consider SGLT2  For questions or updates, please contact [facility] Please consult [email or web address] for contact info under        Signed, , NP  10/16/2020, 8:00 AM

## 2020-10-16 NOTE — Consult Note (Signed)
[address]       [place] [address]             [phone]        MARYLAND LUPPINO [facility] Medical Record #[id number] Date of Birth: 1946/12/04  Referring: No ref. provider found Primary Care: [facility] Primary Cardiologist:Tiffany Duke [place], MD  Chief Complaint:    Chief Complaint  Patient presents with  . Chest Pain    History of Present Illness:     74 year old male transferred from [facility] for an NSTEMI.  He admits to a 2 to 3-week history of progressive angina.  He does have a history of paroxysmal atrial fibrillation on Eliquis for this.  CTS has been consulted to assist with management.  Past Medical and Surgical History: Previous Chest Surgery: No Previous Chest Radiation: No Diabetes Mellitus: No.  HbA1C pending Creatinine: 1.08  Past Medical History:  Diagnosis Date  . Diabetes mellitus without complication (HCC)   . GERD (gastroesophageal reflux disease)   . HOH (hard of hearing)    AIDS  . Hyperlipidemia   . Hypertension     Past Surgical History:  Procedure Laterality Date  . APPENDECTOMY    . CATARACT EXTRACTION W/PHACO Right 02/26/2018   Procedure: CATARACT EXTRACTION PHACO AND INTRAOCULAR LENS PLACEMENT (IOC);  Surgeon: Nevada Crane, MD;  Location: [facility] ORS;  Service: Ophthalmology;  Laterality: Right;  fluid pak loT# [id number] H  exp10/31/2020 [place]   00:38.9 AP%   16.3 CDE   6.33  . COLONOSCOPY    . LEFT HEART CATH AND CORONARY ANGIOGRAPHY N/A 10/16/2020   Procedure: LEFT HEART CATH AND CORONARY ANGIOGRAPHY;  Surgeon: Marykay Lex, MD;  Location: [facility] INVASIVE CV LAB;  Service: Cardiovascular;  Laterality: N/A;     Social History   Tobacco Use  Smoking Status Former Smoker  Smokeless Tobacco Never Used    Social History   Substance and Sexual Activity  Alcohol Use No     Allergies  Allergen Reactions  . Atorvastatin Other (See Comments)    C/o myalgias in knees     Medications: Asprin: Yes Statin: Yes Beta Blocker: Yes Ace Inhibitor: Lisinopril Anti-Coagulation: Eliquis last dose on 10/24  Current Facility-Administered Medications  Medication Dose Route Frequency Provider Last Rate Last Admin  . 0.9 %  sodium chloride infusion   Intravenous Continuous Marykay Lex, MD 100 mL/hr at 10/16/20 1432 Restarted at 10/16/20 1432  . 0.9 %  sodium chloride infusion  250 mL Intravenous PRN Marykay Lex, MD      . acetaminophen (TYLENOL) tablet 650 mg  650 mg Oral Q4H PRN Marykay Lex, MD      . amLODipine (NORVASC) tablet 10 mg  10 mg Oral Daily Marykay Lex, MD   10 mg at 10/16/20 0958  . aspirin chewable tablet 324 mg  324 mg Oral NOW Marykay Lex, MD       Or  . aspirin suppository 300 mg  300 mg Rectal NOW Marykay Lex, MD      . aspirin EC tablet 81 mg  81 mg Oral Daily Marykay Lex, MD      . carvedilol (COREG) tablet 6.25 mg  6.25 mg Oral BID WC Marykay Lex, MD   6.25 mg at 10/16/20 0957  . ezetimibe (ZETIA) tablet 10 mg  10 mg Oral Daily Marykay Lex, MD   10 mg at 10/16/20 0957  .  heparin ADULT infusion 100 units/mL (25000 units/27mL sodium chloride 0.45%)  1,400 Units/hr Intravenous Continuous Chilton Si, MD      . hydrALAZINE (APRESOLINE) tablet 50 mg  50 mg Oral Q8H Marykay Lex, MD   50 mg at 10/16/20 1005  . insulin aspart (novoLOG) injection 0-15 Units  0-15 Units Subcutaneous TID WC Marykay Lex, MD   2 Units at 10/16/20 1432  . insulin aspart (novoLOG) injection 0-5 Units  0-5 Units Subcutaneous QHS Marykay Lex, MD      . labetalol (NORMODYNE) injection 10 mg  10 mg Intravenous Q10 min PRN Marykay Lex, MD      . lisinopril (ZESTRIL) tablet 40 mg  40 mg Oral Daily Marykay Lex, MD   40 mg at 10/16/20 0958  . nitroGLYCERIN (NITROSTAT) SL tablet 0.4 mg  0.4 mg Sublingual Q5 Min x 3 PRN Marykay Lex, MD      . ondansetron [facility]) injection 4 mg  4 mg Intravenous Q6H PRN  Marykay Lex, MD      . pantoprazole (PROTONIX) EC tablet 40 mg  40 mg Oral Daily Marykay Lex, MD   40 mg at 10/16/20 0958  . rosuvastatin (CRESTOR) tablet 20 mg  20 mg Oral Daily Marykay Lex, MD   20 mg at 10/16/20 0623  . sodium chloride flush (NS) 0.9 % injection 3 mL  3 mL Intravenous Q12H Marykay Lex, MD   3 mL at 10/15/20 2310  . sodium chloride flush (NS) 0.9 % injection 3 mL  3 mL Intravenous Q12H Marykay Lex, MD      . sodium chloride flush (NS) 0.9 % injection 3 mL  3 mL Intravenous PRN Marykay Lex, MD        Medications Prior to Admission  Medication Sig Dispense Refill Last Dose  . amLODipine (NORVASC) 10 MG tablet Take 10 mg by mouth daily. for high blood pressure  3 10/15/2020 at 0600  . apixaban (ELIQUIS) 5 MG TABS tablet Take 5 mg by mouth in the morning and at bedtime.   10/15/2020 at 0600  . carvedilol (COREG) 12.5 MG tablet Take 12.5 mg by mouth 2 (two) times daily.  1 10/15/2020 at 0600  . hydrALAZINE (APRESOLINE) 25 MG tablet Take 25 mg by mouth 3 (three) times daily.   10/15/2020 at Unknown time  . lisinopril (PRINIVIL,ZESTRIL) 40 MG tablet Take 40 mg by mouth daily.  4 10/15/2020 at Unknown time  . metFORMIN (GLUCOPHAGE) 1000 MG tablet Take 1,000 mg by mouth 2 (two) times daily.  4 10/15/2020 at Unknown time  . Omega-3 Fatty Acids ([employer] NATURAL FISH OIL) 1000 MG CAPS Take 1 capsule by mouth 2 (two) times daily.  5 10/15/2020 at Unknown time  . omeprazole (PRILOSEC) 20 MG capsule Take 20 mg by mouth daily.  3 10/15/2020 at Unknown time    Family History  Problem Relation Age of Onset  . Heart attack Mother   . Heart attack Father   . Heart attack Sister   . Heart attack Brother      Review of Systems:   Review of Systems  Constitutional: Positive for malaise/fatigue.  Respiratory: Positive for shortness of breath.   Cardiovascular: Positive for chest pain.  Neurological: Negative.       Physical Exam: BP (!) 127/103   Pulse 70    Temp 98 F (36.7 C) (Oral)   Resp 16   Ht 6' (1.829 m)  Wt 107 kg   SpO2 99%   BMI 31.99 kg/m  Physical Exam Constitutional:      Appearance: He is well-developed.  HENT:     Head: Normocephalic and atraumatic.  Cardiovascular:     Rate and Rhythm: Normal rate and regular rhythm.  Pulmonary:     Effort: Pulmonary effort is normal.  Abdominal:     Palpations: Abdomen is soft.  Musculoskeletal:        General: Normal range of motion.     Cervical back: Normal range of motion.  Neurological:     General: No focal deficit present.     Mental Status: He is alert and oriented to person, place, and time.       Diagnostic Studies & Laboratory data:    Left Heart Catherization: Left heart catheter was reviewed.  Good distal targets in the LAD and circumflex.  The RCA is completely occluded.  The PDA fills from left to right collaterals. Echo: Pending   I have independently reviewed the above radiologic studies and discussed with the patient   Recent Lab Findings: Lab Results  Component Value Date   WBC 7.2 10/16/2020   HGB 12.9 (L) 10/16/2020   HCT 38.6 (L) 10/16/2020   PLT 265 10/16/2020   GLUCOSE 155 (H) 10/16/2020   CHOL 212 (H) 10/16/2020   TRIG 228 (H) 10/16/2020   HDL 37 (L) 10/16/2020   LDLCALC 129 (H) 10/16/2020   ALT 23 10/15/2020   AST 24 10/15/2020   NA 138 10/16/2020   K 3.5 10/16/2020   CL 103 10/16/2020   CREATININE 1.08 10/16/2020   BUN 10 10/16/2020   CO2 25 10/16/2020   INR 1.1 10/15/2020      Assessment / Plan:   74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  Hx of paroxysmal atrial fibrillation.  Awaiting Echo.  Awaiting eliquis washout.    He would like to discuss plans with his children, but he is agreeable to proceed with surgery.  He will give [place] his formal decision tomorrow.  Will plan for CABG 4-5 on 10/27     I  spent 30 minutes counseling the patient face to face.   Corliss Skains 10/16/2020 2:59 PM

## 2020-10-16 NOTE — Interval H&P Note (Signed)
History and Physical Interval Note:  10/16/2020 12:28 PM  Herbert Torres  has presented today for surgery, with the diagnosis of NSTEMI.  The various methods of treatment have been discussed with the patient and family. After consideration of risks, benefits and other options for treatment, the patient has consented to  Procedure(s): LEFT HEART CATH AND CORONARY ANGIOGRAPHY (N/A)  PERCUTANEOUS CORONARY INTERVENTION  as a surgical intervention.  The patient's history has been reviewed, patient examined, no change in status, stable for surgery.  I have reviewed the patient's chart and labs.  Questions were answered to the patient's satisfaction.    Cath Lab Visit (complete for each Cath Lab visit)  Clinical Evaluation Leading to the Procedure:   ACS: Yes.    Non-ACS:    Anginal Classification: CCS IV  Anti-ischemic medical therapy: Minimal Therapy (1 class of medications)  Non-Invasive Test Results: No non-invasive testing performed  Prior CABG: No previous CABG   Bryan Lemma

## 2020-10-16 NOTE — Progress Notes (Signed)
TCTS consulted for CABG evaluation. °

## 2020-10-17 DIAGNOSIS — I10 Essential (primary) hypertension: Secondary | ICD-10-CM | POA: Diagnosis not present

## 2020-10-17 DIAGNOSIS — E119 Type 2 diabetes mellitus without complications: Secondary | ICD-10-CM | POA: Diagnosis not present

## 2020-10-17 DIAGNOSIS — E7800 Pure hypercholesterolemia, unspecified: Secondary | ICD-10-CM | POA: Diagnosis not present

## 2020-10-17 DIAGNOSIS — I214 Non-ST elevation (NSTEMI) myocardial infarction: Secondary | ICD-10-CM | POA: Diagnosis not present

## 2020-10-17 LAB — URINALYSIS, ROUTINE W REFLEX MICROSCOPIC
Bacteria, UA: NONE SEEN
Bilirubin Urine: NEGATIVE
Glucose, UA: NEGATIVE mg/dL
Hgb urine dipstick: NEGATIVE
Ketones, ur: NEGATIVE mg/dL
Nitrite: NEGATIVE
Protein, ur: NEGATIVE mg/dL
Specific Gravity, Urine: 1.014 (ref 1.005–1.030)
pH: 7 (ref 5.0–8.0)

## 2020-10-17 LAB — APTT: aPTT: 60 seconds — ABNORMAL HIGH (ref 24–36)

## 2020-10-17 LAB — CBC
HCT: 41.6 % (ref 39.0–52.0)
Hemoglobin: 13.8 g/dL (ref 13.0–17.0)
MCH: 30.3 pg (ref 26.0–34.0)
MCHC: 33.2 g/dL (ref 30.0–36.0)
MCV: 91.4 fL (ref 80.0–100.0)
Platelets: 274 10*3/uL (ref 150–400)
RBC: 4.55 MIL/uL (ref 4.22–5.81)
RDW: 12.6 % (ref 11.5–15.5)
WBC: 7 10*3/uL (ref 4.0–10.5)
nRBC: 0 % (ref 0.0–0.2)

## 2020-10-17 LAB — BASIC METABOLIC PANEL
Anion gap: 12 (ref 5–15)
BUN: 8 mg/dL (ref 8–23)
CO2: 26 mmol/L (ref 22–32)
Calcium: 9.4 mg/dL (ref 8.9–10.3)
Chloride: 101 mmol/L (ref 98–111)
Creatinine, Ser: 1.16 mg/dL (ref 0.61–1.24)
GFR, Estimated: >60 mL/min (ref >60)
Glucose, Bld: 160 mg/dL — ABNORMAL HIGH (ref 70–99)
Potassium: 4.2 mmol/L (ref 3.5–5.1)
Sodium: 139 mmol/L (ref 135–145)

## 2020-10-17 LAB — SURGICAL PCR SCREEN
MRSA, PCR: NEGATIVE
Staphylococcus aureus: NEGATIVE

## 2020-10-17 LAB — GLUCOSE, CAPILLARY
Glucose-Capillary: 159 mg/dL — ABNORMAL HIGH (ref 70–99)
Glucose-Capillary: 176 mg/dL — ABNORMAL HIGH (ref 70–99)
Glucose-Capillary: 118 mg/dL — ABNORMAL HIGH (ref 70–99)
Glucose-Capillary: 133 mg/dL — ABNORMAL HIGH (ref 70–99)

## 2020-10-17 LAB — ABO/RH: ABO/RH(D): A POS

## 2020-10-17 LAB — HEPARIN LEVEL (UNFRACTIONATED): Heparin Unfractionated: 0.49 IU/mL (ref 0.30–0.70)

## 2020-10-17 LAB — HEMOGLOBIN A1C
Hgb A1c MFr Bld: 6.7 % — ABNORMAL HIGH (ref 4.8–5.6)
Mean Plasma Glucose: 146 mg/dL

## 2020-10-17 LAB — PREPARE RBC (CROSSMATCH)

## 2020-10-17 MED ORDER — CHLORHEXIDINE GLUCONATE 0.12 % MT SOLN
15.0000 mL | Freq: Once | OROMUCOSAL | Status: AC
  Administered 2020-10-18: 15 mL via OROMUCOSAL
  Filled 2020-10-17: qty 15

## 2020-10-17 MED ORDER — POTASSIUM CHLORIDE 2 MEQ/ML IV SOLN
80.0000 meq | INTRAVENOUS | Status: DC
  Filled 2020-10-17: qty 40

## 2020-10-17 MED ORDER — TRANEXAMIC ACID (OHS) BOLUS VIA INFUSION
15.0000 mg/kg | INTRAVENOUS | Status: DC
  Filled 2020-10-17: qty 1590

## 2020-10-17 MED ORDER — NITROGLYCERIN IN D5W 200-5 MCG/ML-% IV SOLN
2.0000 ug/min | INTRAVENOUS | Status: DC
  Filled 2020-10-17: qty 250

## 2020-10-17 MED ORDER — INSULIN REGULAR(HUMAN) IN NACL 100-0.9 UT/100ML-% IV SOLN
INTRAVENOUS | Status: DC
  Filled 2020-10-17: qty 100

## 2020-10-17 MED ORDER — PHENYLEPHRINE HCL-NACL 20-0.9 MG/250ML-% IV SOLN
30.0000 ug/min | INTRAVENOUS | Status: DC
  Filled 2020-10-17: qty 250

## 2020-10-17 MED ORDER — MANNITOL 20 % IV SOLN
Freq: Once | INTRAVENOUS | Status: DC
  Filled 2020-10-17: qty 13

## 2020-10-17 MED ORDER — EPINEPHRINE HCL 5 MG/250ML IV SOLN IN NS
0.0000 ug/min | INTRAVENOUS | Status: DC
  Filled 2020-10-17: qty 250

## 2020-10-17 MED ORDER — DEXMEDETOMIDINE HCL IN NACL 400 MCG/100ML IV SOLN
0.1000 ug/kg/h | INTRAVENOUS | Status: DC
  Filled 2020-10-17: qty 100

## 2020-10-17 MED ORDER — MILRINONE LACTATE IN DEXTROSE 20-5 MG/100ML-% IV SOLN
0.3000 ug/kg/min | INTRAVENOUS | Status: DC
  Filled 2020-10-17: qty 100

## 2020-10-17 MED ORDER — TRANEXAMIC ACID (OHS) PUMP PRIME SOLUTION
2.0000 mg/kg | INTRAVENOUS | Status: DC
  Filled 2020-10-17: qty 2.12

## 2020-10-17 MED ORDER — CHLORHEXIDINE GLUCONATE CLOTH 2 % EX PADS: 6.0000 | MEDICATED_PAD | Freq: Once | CUTANEOUS | Status: DC

## 2020-10-17 MED ORDER — SODIUM CHLORIDE 0.9 % IV SOLN
750.0000 mg | INTRAVENOUS | Status: DC
  Filled 2020-10-17: qty 750

## 2020-10-17 MED ORDER — TEMAZEPAM 15 MG PO CAPS
15.0000 mg | ORAL_CAPSULE | Freq: Once | ORAL | Status: AC | PRN
  Administered 2020-10-17: 15 mg via ORAL
  Filled 2020-10-17: qty 1

## 2020-10-17 MED ORDER — METOPROLOL TARTRATE 12.5 MG HALF TABLET
12.5000 mg | ORAL_TABLET | Freq: Once | ORAL | Status: AC
  Administered 2020-10-18: 12.5 mg via ORAL
  Filled 2020-10-17: qty 1

## 2020-10-17 MED ORDER — NOREPINEPHRINE 4 MG/250ML-% IV SOLN
0.0000 ug/min | INTRAVENOUS | Status: DC
  Filled 2020-10-17: qty 250

## 2020-10-17 MED ORDER — SODIUM CHLORIDE 0.9 % IV SOLN
INTRAVENOUS | Status: DC
  Filled 2020-10-17: qty 30

## 2020-10-17 MED ORDER — CHLORHEXIDINE GLUCONATE CLOTH 2 % EX PADS
6.0000 | MEDICATED_PAD | Freq: Once | CUTANEOUS | Status: AC
  Administered 2020-10-17: 6 via TOPICAL

## 2020-10-17 MED ORDER — SODIUM CHLORIDE 0.9 % IV SOLN
1.5000 g | INTRAVENOUS | Status: DC
  Filled 2020-10-17: qty 1.5

## 2020-10-17 MED ORDER — VANCOMYCIN HCL 1500 MG/300ML IV SOLN
1500.0000 mg | INTRAVENOUS | Status: DC
  Filled 2020-10-17: qty 300

## 2020-10-17 MED ORDER — PLASMA-LYTE 148 IV SOLN
INTRAVENOUS | Status: DC
  Filled 2020-10-17: qty 2.5

## 2020-10-17 MED ORDER — BISACODYL 5 MG PO TBEC
5.0000 mg | DELAYED_RELEASE_TABLET | Freq: Once | ORAL | Status: AC
  Administered 2020-10-17: 5 mg via ORAL
  Filled 2020-10-17: qty 1

## 2020-10-17 MED ORDER — TRANEXAMIC ACID 1000 MG/10ML IV SOLN
1.5000 mg/kg/h | INTRAVENOUS | Status: DC
  Filled 2020-10-17: qty 25

## 2020-10-17 MED ORDER — MUPIROCIN 2 % EX OINT
1.0000 "application " | TOPICAL_OINTMENT | Freq: Two times a day (BID) | CUTANEOUS | Status: DC
  Administered 2020-10-17 – 2020-10-19 (×3): 1 via NASAL
  Filled 2020-10-17 (×3): qty 22

## 2020-10-17 NOTE — Progress Notes (Signed)
Progress Note  Patient Name: Herbert Torres Date of Encounter: 10/17/2020  [facility] Cardiologist: Herbert Si, MD   Subjective   Feeling well this morning. No chest pain. Sitting up in chair.   Inpatient Medications    Scheduled Meds: . amLODipine  10 mg Oral Daily  . aspirin EC  81 mg Oral Daily  . carvedilol  6.25 mg Oral BID WC  . ezetimibe  10 mg Oral Daily  . hydrALAZINE  50 mg Oral Q8H  . insulin aspart  0-15 Units Subcutaneous TID WC  . insulin aspart  0-5 Units Subcutaneous QHS  . lisinopril  40 mg Oral Daily  . pantoprazole  40 mg Oral Daily  . rosuvastatin  20 mg Oral Daily  . sodium chloride flush  3 mL Intravenous Q12H  . sodium chloride flush  3 mL Intravenous Q12H   Continuous Infusions: . sodium chloride 250 mL (10/16/20 2128)  . heparin 1,400 Units/hr (10/17/20 0500)   PRN Meds: sodium chloride, acetaminophen, nitroGLYCERIN, ondansetron (ZOFRAN) IV, sodium chloride flush   Vital Signs    Vitals:   10/16/20 1720 10/16/20 1726 10/16/20 1947 10/17/20 0439  BP: (!) 141/68 (!) 141/68 (!) 142/73 (!) 146/70  Pulse: 72 68 82 76  Resp:  16 17 16   Temp:  97.7 F (36.5 C) 98.1 F (36.7 C) 98.1 F (36.7 C)  TempSrc:  Oral Oral Oral  SpO2: 96% 95% 95% 95%  Weight:    106 kg  Height:        Intake/Output Summary (Last 24 hours) at 10/17/2020 0838 Last data filed at 10/17/2020 0440 Gross per 24 hour  Intake 319.07 ml  Output 1700 ml  Net -1380.93 ml   Last 3 Weights 10/17/2020 10/16/2020 10/15/2020  Weight (lbs) 233 lb 11.2 oz 235 lb 14.4 oz 239 lb 10.2 oz  Weight (kg) 106.006 kg 107.004 kg 108.7 kg      Telemetry    SR with PACs - Personally Reviewed  ECG    No new tracing  Physical Exam  Pleasant older male, sitting up in chair GEN: No acute distress.   Neck: No JVD Cardiac: RRR, no murmurs, rubs, or gallops.  Respiratory: Clear to auscultation bilaterally. GI: Soft, nontender, non-distended  MS: No edema; No deformity.  Right radial cath site stable.  Neuro:  Nonfocal  Psych: Normal affect   Labs    High Sensitivity Troponin:   Recent Labs  Lab 10/15/20 0958 10/15/20 1126 10/15/20 1656 10/15/20 1926  TROPONINIHS 266* 338* 423* 458*      Chemistry Recent Labs  Lab 10/15/20 0958 10/16/20 0148 10/17/20 0558  NA 134* 138 139  K 4.0 3.5 4.2  CL 100 103 101  CO2 23 25 26   GLUCOSE 156* 155* 160*  BUN 12 10 8   CREATININE 0.95 1.08 1.16  CALCIUM 9.0 8.9 9.4  PROT 7.6  --   --   ALBUMIN 4.3  --   --   AST 24  --   --   ALT 23  --   --   ALKPHOS 50  --   --   BILITOT 0.5  --   --   GFRNONAA >60 >60 >60  ANIONGAP 11 10 12      Hematology Recent Labs  Lab 10/15/20 0958 10/16/20 0148 10/17/20 0558  WBC 6.4 7.2 7.0  RBC 4.63 4.22 4.55  HGB 14.2 12.9* 13.8  HCT 43.5 38.6* 41.6  MCV 94.0 91.5 91.4  MCH 30.7 30.6 30.3  MCHC 32.6 33.4 33.2  RDW 12.8 12.7 12.6  PLT 285 265 274    BNP Recent Labs  Lab 10/15/20 1656  BNP 154.0*     DDimer No results for input(s): DDIMER in the last 168 hours.   Radiology    CARDIAC CATHETERIZATION  Result Date: 10/16/2020  Mid RCA to Dist RCA lesion is 100% stenosed.  RV Branch lesion is 90% stenosed.  ---------------------------  Prox LAD to Mid LAD lesion is 80% stenosed with 70% stenosed side branch in 1st Diag & 1st Sept lesion is 70% stenosed.  Mid LAD lesion is 75% stenosed.  ---------------------------  Prox Cx lesion is 75% stenosed.  Lat 3rd Mrg lesion is 80% stenosed.  Diminutive 4th Mrg lesion is 90% stenosed.  Dist Cx / OM lesion is 90% stenosed.  ===================  There is no aortic valve stenosis.  LV end diastolic pressure is normal.  The left ventricular systolic function is normal.  There is trivial (1+) mitral regurgitation.  SUMMARY  Severe multivessel CAD:  Possible subacute occlusion or CTO of RCA after RV marginal branch that has an 90% stenosis, with minimal left to right collaterals;  Focal eccentric  calcified 80% stenosis of the LAD at the takeoff of SP1 (1st Sept) and D1 (1st Diag) along with eccentric 70% mid LAD, and  75% proximal major LCx that has diffuse areas of relatively severe stenosis in the sidebranches.  Relatively normal EF with mild inferior hypokinesis.  Relatively normal LVEDP. RECOMMENDATIONS  With diabetes and existing A. fib on Eliquis already, best option for this gentleman is CVTS consult for CABG.  Continue aggressive risk factor modification. Results reviewed with Dr. [place].  CVTS consult called Herbert Lemma, MD Herbert Torres, M.D., M.S. Interventional Cardiologist Pager # [phone] Phone # [phone] [address] [place], [place] [address]   DG Chest Port 1 View  Result Date: 10/15/2020 CLINICAL DATA:  Chest pain for 3 weeks. EXAM: PORTABLE CHEST 1 VIEW COMPARISON:  02/22/2016 chest radiograph FINDINGS: Cardiomegaly is identified. Chronic peribronchial thickening again noted. There is no evidence of focal airspace disease, pulmonary edema, suspicious pulmonary nodule/mass, pleural effusion, or pneumothorax. No acute bony abnormalities are identified. IMPRESSION: Cardiomegaly without evidence of acute cardiopulmonary disease. Electronically Signed   By: Herbert Torres M.D.   On: 10/15/2020 10:12   ECHOCARDIOGRAM COMPLETE  Result Date: 10/16/2020    ECHOCARDIOGRAM REPORT   Patient Name:   Herbert Torres [facility] Date of Exam: 10/16/2020 Medical Rec #:  [id number]       Height:       72.0 in Accession #:    [id number]      Weight:       235.9 lb Date of Birth:  Mar 09, 1946      BSA:          2.285 m Patient Age:    73 years        BP:           127/103 mmHg Patient Gender: M               HR:           70 bpm. Exam Location:  Inpatient Procedure: 2D Echo, Cardiac Doppler, Color Doppler and Intracardiac            Opacification Agent Indications:    Chest pain  History:        Patient has no prior history of Echocardiogram examinations.  Acute MI,  Arrythmias:Atrial Fibrillation; Risk                 Factors:Hypertension and Former Smoker.  Sonographer:    Herbert Torres RDCS (AE) Referring Phys: [phone] [facility] Herbert Torres  Sonographer Comments: Suboptimal apical window and suboptimal subcostal window. IMPRESSIONS  1. Left ventricular ejection fraction, by estimation, is 50 to 55%. The left ventricle has low normal function. The left ventricle demonstrates regional wall motion abnormalities (see scoring diagram/findings for description). Left ventricular diastolic  parameters are consistent with Grade I diastolic dysfunction (impaired relaxation).  2. Right ventricular systolic function is normal. The right ventricular size is mildly enlarged.  3. Left atrial size was mild to moderately dilated.  4. The mitral valve is grossly normal. No evidence of mitral valve regurgitation.  5. The aortic valve was not well visualized. Aortic valve regurgitation is not visualized.  6. The inferior vena cava is normal in size with <50% respiratory variability, suggesting right atrial pressure of 8 mmHg. Comparison(s): No prior Echocardiogram. FINDINGS  Left Ventricle: Concentric Remodeling LVMI 107 g/m2; RWT 0.71. Left ventricular ejection fraction, by estimation, is 50 to 55%. The left ventricle has low normal function. The left ventricle demonstrates regional wall motion abnormalities. Definity contrast agent was given IV to delineate the left ventricular endocardial borders. The left ventricular internal cavity size was normal in size. There is no left ventricular hypertrophy. Left ventricular diastolic parameters are consistent with Grade I diastolic dysfunction (impaired relaxation).  LV Wall Scoring: The inferior wall is hypokinetic. Right Ventricle: The right ventricular size is mildly enlarged. No increase in right ventricular wall thickness. Right ventricular systolic function is normal. Left Atrium: Left atrial size was mild to moderately dilated. Right Atrium: Right  atrial size was normal in size. Pericardium: There is no evidence of pericardial effusion. Presence of pericardial fat pad. Mitral Valve: The mitral valve is grossly normal. No evidence of mitral valve regurgitation. Tricuspid Valve: The tricuspid valve is not well visualized. Tricuspid valve regurgitation is not demonstrated. Aortic Valve: The aortic valve was not well visualized. Aortic valve regurgitation is not visualized. Aortic valve mean gradient measures 4.3 mmHg. Aortic valve peak gradient measures 7.4 mmHg. Aortic valve area, by VTI measures 2.47 cm. Pulmonic Valve: The pulmonic valve was not well visualized. Pulmonic valve regurgitation is not visualized. Aorta: The aortic root is normal in size and structure. Venous: The pulmonary veins were not well visualized. The inferior vena cava is normal in size with less than 50% respiratory variability, suggesting right atrial pressure of 8 mmHg. IAS/Shunts: The atrial septum is grossly normal.  LEFT VENTRICLE PLAX 2D LVIDd:         4.20 cm  Diastology LVIDs:         3.00 cm  LV e' medial:    6.31 cm/s LV PW:         1.50 cm  LV E/e' medial:  14.5 LV IVS:        1.50 cm  LV e' lateral:   9.36 cm/s LVOT diam:     2.10 cm  LV E/e' lateral: 9.8 LV SV:         70 LV SV Index:   30 LVOT Area:     3.46 cm  RIGHT VENTRICLE             IVC RV Basal diam:  3.80 cm     IVC diam: 2.00 cm RV Mid diam:    3.60 cm RV S prime:  12.30 cm/s TAPSE (M-mode): 3.4 cm LEFT ATRIUM             Index       RIGHT ATRIUM           Index LA diam:        3.50 cm 1.53 cm/m  RA Area:     20.90 cm LA Vol (A2C):   80.8 ml 35.35 ml/m RA Volume:   60.20 ml  26.34 ml/m LA Vol (A4C):   45.1 ml 19.73 ml/m LA Biplane Vol: 59.9 ml 26.21 ml/m  AORTIC VALVE AV Area (Vmax):    2.60 cm AV Area (Vmean):   2.37 cm AV Area (VTI):     2.47 cm AV Vmax:           136.00 cm/s AV Vmean:          102.700 cm/s AV VTI:            0.282 m AV Peak Grad:      7.4 mmHg AV Mean Grad:      4.3 mmHg LVOT  Vmax:         102.00 cm/s LVOT Vmean:        70.200 cm/s LVOT VTI:          0.201 m LVOT/AV VTI ratio: 0.71  AORTA Ao Root diam: 3.70 cm Ao Asc diam:  3.00 cm MITRAL VALVE MV Area (PHT): 2.22 cm    SHUNTS MV Decel Time: 342 msec    Systemic VTI:  0.20 m MV E velocity: 91.30 cm/s  Systemic Diam: 2.10 cm MV A velocity: 84.00 cm/s MV E/A ratio:  1.09 Riley Lam MD Electronically signed by Riley Lam MD Signature Date/Time: 10/16/2020/7:12:09 PM    Final     Cardiac Studies   Cath: 10/16/20   Mid RCA to Dist RCA lesion is 100% stenosed.  RV Branch lesion is 90% stenosed.  ---------------------------  Prox LAD to Mid LAD lesion is 80% stenosed with 70% stenosed side branch in 1st Diag & 1st Sept lesion is 70% stenosed.  Mid LAD lesion is 75% stenosed.  ---------------------------  Prox Cx lesion is 75% stenosed.  Lat 3rd Mrg lesion is 80% stenosed.  Diminutive 4th Mrg lesion is 90% stenosed.  Dist Cx / OM lesion is 90% stenosed.  ===================  There is no aortic valve stenosis.  LV end diastolic pressure is normal.  The left ventricular systolic function is normal.  There is trivial (1+) mitral regurgitation.   SUMMARY  Severe multivessel CAD:  ? Possible subacute occlusion or CTO of RCA after RV marginal branch that has an 90% stenosis, with minimal left to right collaterals;  ? Focal eccentric calcified 80% stenosis of the LAD at the takeoff of SP1 (1st Sept) and D1 (1st Diag) along with eccentric 70% mid LAD, and  ? 75% proximal major LCx that has diffuse areas of relatively severe stenosis in the sidebranches.  Relatively normal EF with mild inferior hypokinesis.  Relatively normal LVEDP.   RECOMMENDATIONS  With diabetes and existing A. fib on Eliquis already, best option for this gentleman is CVTS consult for CABG.  Continue aggressive risk factor modification.  Results reviewed with Dr. [place].  CVTS consult called  Herbert Lemma, MD  Diagnostic Dominance: Right   Echo: 10/16/20  IMPRESSIONS    1. Left ventricular ejection fraction, by estimation, is 50 to 55%. The  left ventricle has low normal function. The left ventricle demonstrates  regional wall motion abnormalities (  see scoring diagram/findings for  description). Left ventricular diastolic  parameters are consistent with Grade I diastolic dysfunction (impaired  relaxation).  2. Right ventricular systolic function is normal. The right ventricular  size is mildly enlarged.  3. Left atrial size was mild to moderately dilated.  4. The mitral valve is grossly normal. No evidence of mitral valve  regurgitation.  5. The aortic valve was not well visualized. Aortic valve regurgitation  is not visualized.  6. The inferior vena cava is normal in size with <50% respiratory  variability, suggesting right atrial pressure of 8 mmHg.   Comparison(s): No prior Echocardiogram.   Patient Profile     74 y.o. male with PMH of Afib, prior tobacco use, DM, HTN and HLD who presented with chest pain and admitted with NSTEMI.   Assessment & Plan    1. NSTEMI: hsTn peaked at 458, underwent cardiac yesterday noted above with 3v disease. Seen by Dr. Cliffton Asters with plans for CABG tomorrow. Remains chest pain free. Echo with low normal EF, g1DD and inferior wall hypokinesis.  -- continue ASA, statin, BB and ACEi  2. HLD: had both rosuvastatin/simvastatin listed on home meds list. Possibly taking both prior to admission? In talking with patient he reports issues with both Lipitor and Crestor 2/2 myalgias prior to admission. Currently on crestor  daily started on admission, he is agreeable to continue this as this time -- LDL 129 -- start Zetia, will refer to lipid clinic at discharge  3. HTN: stable -- continue coreg 6.25mg  BID(this was reduced on admission 2/2 bradycardia), amlodipine  daily and hydralazine to  TID  4. PAF: currently in SR  with PACs, Eliquis has been held post cath, now with plans for CABG. Chadsvasc 5 -- remains on IV heparin.   5. DM: on metformin at home.  Hgb A1c 6.7. -- SSI while inpatient -- consider SGLT2  For questions or updates, please contact [facility] Please consult [email or web address] for contact info under        Signed, Laverda Page, NP  10/17/2020, 8:38 AM

## 2020-10-17 NOTE — Progress Notes (Signed)
CARDIAC REHAB PHASE I   Preop education completed with pt. Pt with IS at bedside, able to demonstrate >1750. Pt educated on importance of IS use, walks, and sternal precautions postop. Pt given in-the-tube sheet along with cardiac surgery booklet and [facility] care guide. Pt states he is working on finding care for after surgery. Pt denies further questions or concerns at this time. Will continue to follow throughout his hospital stay.  [id number] Reynold Bowen, RN BSN 10/17/2020 10:42 AM

## 2020-10-17 NOTE — Progress Notes (Signed)
Patient rhythm converted from NSR to  Atrial Fib . Patient resting , denies any discomforts. Dr Julianne Handler notified., no new order ,continue to  monitor patient.

## 2020-10-17 NOTE — Progress Notes (Signed)
     [address]       [place] [address]             [phone]       Case discussed with the patient, and echocardiogram reviewed. No evidence of significant valvular disease and preserved RV and LV function.  Lisinopril held OR tomorrow for CABG, atrial clip  Raini Tiley O Jadira Nierman

## 2020-10-17 NOTE — Progress Notes (Signed)
ANTICOAGULATION CONSULT NOTE   Pharmacy Consult for Heparin Indication: chest pain/ACS  Allergies  Allergen Reactions  . Atorvastatin Other (See Comments)    C/o myalgias in knees    Patient Measurements: Height: 6' (182.9 cm) Weight: 106 kg (233 lb 11.2 oz) IBW/kg (Calculated) : 77.6 HEPARIN DW (KG): 100.5  Vital Signs: Temp: 98.1 F (36.7 C) (10/26 0439) Temp Source: Oral (10/26 0439) BP: 146/70 (10/26 0439) Pulse Rate: 76 (10/26 0439)  Labs: Recent Labs    10/15/20 0958 10/15/20 0958 10/15/20 1126 10/15/20 1656 10/15/20 1926 10/16/20 0148 10/17/20 0558  HGB 14.2   < >  --   --   --  12.9* 13.8  HCT 43.5  --   --   --   --  38.6* 41.6  PLT 285  --   --   --   --  265 274  APTT 33  --   --   --   --  43* 60*  LABPROT 13.6  --   --   --   --   --   --   INR 1.1  --   --   --   --   --   --   HEPARINUNFRC >2.20*  --   --   --   --  0.83* 0.49  CREATININE 0.95  --   --   --   --  1.08 1.16  TROPONINIHS 266*   < > 338* 423* 458*  --   --    < > = values in this interval not displayed.    Estimated Creatinine Clearance: 69.3 mL/min (by C-G formula based on SCr of 1.16 mg/dL).   Medical History: Past Medical History:  Diagnosis Date  . Coronary artery disease   . Diabetes mellitus without complication (HCC)   . GERD (gastroesophageal reflux disease)   . HOH (hard of hearing)    AIDS  . Hyperlipidemia   . Hypertension     Medications:  See med rec  Assessment: Patient presented with chest pain. He is chronically on eliquis for afib and last dose was taken 10/24 this AM at 0600. Pharmacy asked to start heparin. Will start at 1800 without a bolus and order labs including APTT and anti-xa levels.  Heparin off for cath lab, pharmacy asked to resume tonight (8 hrs post sheath).  Spoke to RN, TR band has been off for about 1 hr and site looks stable.  10/26 AM update:  APTT low at 60  Goal of Therapy:  Heparin level 0.3-0.7 units/ml aPTT 66-102  seconds Monitor platelets by anticoagulation protocol: Yes   Plan:  Inc heparin to 1500 units/hr 1400 heparin level/aPTT  Abran Duke, PharmD, BCPS Clinical Pharmacist Phone: [phone]

## 2020-10-18 ENCOUNTER — Inpatient Hospital Stay (HOSPITAL_COMMUNITY): Payer: Medicare Other

## 2020-10-18 ENCOUNTER — Inpatient Hospital Stay (HOSPITAL_COMMUNITY): Payer: Medicare Other | Admitting: Anesthesiology

## 2020-10-18 ENCOUNTER — Encounter (HOSPITAL_COMMUNITY): Payer: Self-pay | Admitting: Cardiovascular Disease

## 2020-10-18 ENCOUNTER — Inpatient Hospital Stay (HOSPITAL_COMMUNITY)
Admission: EM | Disposition: A | Discharge status: Home Health | Payer: Self-pay | Source: Home / Self Care | Attending: Thoracic Surgery (Cardiothoracic Vascular Surgery)

## 2020-10-18 DIAGNOSIS — Z951 Presence of aortocoronary bypass graft: Secondary | ICD-10-CM

## 2020-10-18 DIAGNOSIS — I25110 Atherosclerotic heart disease of native coronary artery with unstable angina pectoris: Secondary | ICD-10-CM | POA: Diagnosis not present

## 2020-10-18 DIAGNOSIS — Z9889 Other specified postprocedural states: Secondary | ICD-10-CM

## 2020-10-18 HISTORY — PX: TEE WITHOUT CARDIOVERSION: SHX5443

## 2020-10-18 HISTORY — PX: CLIPPING OF ATRIAL APPENDAGE: SHX5773

## 2020-10-18 HISTORY — PX: CORONARY ARTERY BYPASS GRAFT: SHX141

## 2020-10-18 LAB — POCT I-STAT 7, (LYTES, BLD GAS, ICA,H+H)
Acid-Base Excess: 2 mmol/L (ref 0.0–2.0)
Acid-Base Excess: 4 mmol/L — ABNORMAL HIGH (ref 0.0–2.0)
Acid-Base Excess: 4 mmol/L — ABNORMAL HIGH (ref 0.0–2.0)
Acid-Base Excess: 0 mmol/L (ref 0.0–2.0)
Acid-Base Excess: 3 mmol/L — ABNORMAL HIGH (ref 0.0–2.0)
Acid-base deficit: 2 mmol/L (ref 0.0–2.0)
Acid-base deficit: 4 mmol/L — ABNORMAL HIGH (ref 0.0–2.0)
Acid-base deficit: 3 mmol/L — ABNORMAL HIGH (ref 0.0–2.0)
Bicarbonate: 28.6 mmol/L — ABNORMAL HIGH (ref 20.0–28.0)
Bicarbonate: 28.7 mmol/L — ABNORMAL HIGH (ref 20.0–28.0)
Bicarbonate: 27.6 mmol/L (ref 20.0–28.0)
Bicarbonate: 25.6 mmol/L (ref 20.0–28.0)
Bicarbonate: 26.7 mmol/L (ref 20.0–28.0)
Bicarbonate: 23.5 mmol/L (ref 20.0–28.0)
Bicarbonate: 21.7 mmol/L (ref 20.0–28.0)
Bicarbonate: 22.6 mmol/L (ref 20.0–28.0)
Calcium, Ion: 1.2 mmol/L (ref 1.15–1.40)
Calcium, Ion: 1 mmol/L — ABNORMAL LOW (ref 1.15–1.40)
Calcium, Ion: 1.02 mmol/L — ABNORMAL LOW (ref 1.15–1.40)
Calcium, Ion: 1.03 mmol/L — ABNORMAL LOW (ref 1.15–1.40)
Calcium, Ion: 1.25 mmol/L (ref 1.15–1.40)
Calcium, Ion: 1.24 mmol/L (ref 1.15–1.40)
Calcium, Ion: 1.22 mmol/L (ref 1.15–1.40)
Calcium, Ion: 1.22 mmol/L (ref 1.15–1.40)
HCT: 40 % (ref 39.0–52.0)
HCT: 30 % — ABNORMAL LOW (ref 39.0–52.0)
HCT: 28 % — ABNORMAL LOW (ref 39.0–52.0)
HCT: 27 % — ABNORMAL LOW (ref 39.0–52.0)
HCT: 29 % — ABNORMAL LOW (ref 39.0–52.0)
HCT: 33 % — ABNORMAL LOW (ref 39.0–52.0)
HCT: 36 % — ABNORMAL LOW (ref 39.0–52.0)
HCT: 37 % — ABNORMAL LOW (ref 39.0–52.0)
Hemoglobin: 13.6 g/dL (ref 13.0–17.0)
Hemoglobin: 10.2 g/dL — ABNORMAL LOW (ref 13.0–17.0)
Hemoglobin: 9.5 g/dL — ABNORMAL LOW (ref 13.0–17.0)
Hemoglobin: 9.2 g/dL — ABNORMAL LOW (ref 13.0–17.0)
Hemoglobin: 9.9 g/dL — ABNORMAL LOW (ref 13.0–17.0)
Hemoglobin: 11.2 g/dL — ABNORMAL LOW (ref 13.0–17.0)
Hemoglobin: 12.2 g/dL — ABNORMAL LOW (ref 13.0–17.0)
Hemoglobin: 12.6 g/dL — ABNORMAL LOW (ref 13.0–17.0)
O2 Saturation: 100 %
O2 Saturation: 100 %
O2 Saturation: 100 %
O2 Saturation: 100 %
O2 Saturation: 99 %
O2 Saturation: 94 %
O2 Saturation: 94 %
O2 Saturation: 92 %
Patient temperature: 36.7
Patient temperature: 37.2
Patient temperature: 37.5
Potassium: 4 mmol/L (ref 3.5–5.1)
Potassium: 3.8 mmol/L (ref 3.5–5.1)
Potassium: 4.3 mmol/L (ref 3.5–5.1)
Potassium: 3.7 mmol/L (ref 3.5–5.1)
Potassium: 4 mmol/L (ref 3.5–5.1)
Potassium: 3.9 mmol/L (ref 3.5–5.1)
Potassium: 4.6 mmol/L (ref 3.5–5.1)
Potassium: 4.9 mmol/L (ref 3.5–5.1)
Sodium: 140 mmol/L (ref 135–145)
Sodium: 142 mmol/L (ref 135–145)
Sodium: 139 mmol/L (ref 135–145)
Sodium: 139 mmol/L (ref 135–145)
Sodium: 140 mmol/L (ref 135–145)
Sodium: 142 mmol/L (ref 135–145)
Sodium: 142 mmol/L (ref 135–145)
Sodium: 140 mmol/L (ref 135–145)
TCO2: 30 mmol/L (ref 22–32)
TCO2: 30 mmol/L (ref 22–32)
TCO2: 29 mmol/L (ref 22–32)
TCO2: 27 mmol/L (ref 22–32)
TCO2: 28 mmol/L (ref 22–32)
TCO2: 25 mmol/L (ref 22–32)
TCO2: 23 mmol/L (ref 22–32)
TCO2: 24 mmol/L (ref 22–32)
pCO2 arterial: 51.2 mmHg — ABNORMAL HIGH (ref 32.0–48.0)
pCO2 arterial: 40.6 mmHg (ref 32.0–48.0)
pCO2 arterial: 36.3 mmHg (ref 32.0–48.0)
pCO2 arterial: 38.5 mmHg (ref 32.0–48.0)
pCO2 arterial: 44.1 mmHg (ref 32.0–48.0)
pCO2 arterial: 39 mmHg (ref 32.0–48.0)
pCO2 arterial: 42.1 mmHg (ref 32.0–48.0)
pCO2 arterial: 42.6 mmHg (ref 32.0–48.0)
pH, Arterial: 7.355 (ref 7.350–7.450)
pH, Arterial: 7.457 — ABNORMAL HIGH (ref 7.350–7.450)
pH, Arterial: 7.489 — ABNORMAL HIGH (ref 7.350–7.450)
pH, Arterial: 7.372 (ref 7.350–7.450)
pH, Arterial: 7.449 (ref 7.350–7.450)
pH, Arterial: 7.386 (ref 7.350–7.450)
pH, Arterial: 7.321 — ABNORMAL LOW (ref 7.350–7.450)
pH, Arterial: 7.335 — ABNORMAL LOW (ref 7.350–7.450)
pO2, Arterial: 333 mmHg — ABNORMAL HIGH (ref 83.0–108.0)
pO2, Arterial: 392 mmHg — ABNORMAL HIGH (ref 83.0–108.0)
pO2, Arterial: 311 mmHg — ABNORMAL HIGH (ref 83.0–108.0)
pO2, Arterial: 149 mmHg — ABNORMAL HIGH (ref 83.0–108.0)
pO2, Arterial: 315 mmHg — ABNORMAL HIGH (ref 83.0–108.0)
pO2, Arterial: 69 mmHg — ABNORMAL LOW (ref 83.0–108.0)
pO2, Arterial: 76 mmHg — ABNORMAL LOW (ref 83.0–108.0)
pO2, Arterial: 71 mmHg — ABNORMAL LOW (ref 83.0–108.0)

## 2020-10-18 LAB — BASIC METABOLIC PANEL
Anion gap: 12 (ref 5–15)
Anion gap: 8 (ref 5–15)
BUN: 15 mg/dL (ref 8–23)
BUN: 11 mg/dL (ref 8–23)
CO2: 23 mmol/L (ref 22–32)
CO2: 22 mmol/L (ref 22–32)
Calcium: 9.1 mg/dL (ref 8.9–10.3)
Calcium: 8.4 mg/dL — ABNORMAL LOW (ref 8.9–10.3)
Chloride: 102 mmol/L (ref 98–111)
Chloride: 108 mmol/L (ref 98–111)
Creatinine, Ser: 1.13 mg/dL (ref 0.61–1.24)
Creatinine, Ser: 1.12 mg/dL (ref 0.61–1.24)
GFR, Estimated: >60 mL/min (ref >60)
GFR, Estimated: >60 mL/min (ref >60)
Glucose, Bld: 172 mg/dL — ABNORMAL HIGH (ref 70–99)
Glucose, Bld: 150 mg/dL — ABNORMAL HIGH (ref 70–99)
Potassium: 3.5 mmol/L (ref 3.5–5.1)
Potassium: 4.8 mmol/L (ref 3.5–5.1)
Sodium: 137 mmol/L (ref 135–145)
Sodium: 138 mmol/L (ref 135–145)

## 2020-10-18 LAB — POCT I-STAT, CHEM 8
BUN: 12 mg/dL (ref 8–23)
BUN: 11 mg/dL (ref 8–23)
BUN: 12 mg/dL (ref 8–23)
BUN: 12 mg/dL (ref 8–23)
BUN: 11 mg/dL (ref 8–23)
Calcium, Ion: 1.26 mmol/L (ref 1.15–1.40)
Calcium, Ion: 1.08 mmol/L — ABNORMAL LOW (ref 1.15–1.40)
Calcium, Ion: 1.18 mmol/L (ref 1.15–1.40)
Calcium, Ion: 0.99 mmol/L — ABNORMAL LOW (ref 1.15–1.40)
Calcium, Ion: 1.23 mmol/L (ref 1.15–1.40)
Chloride: 102 mmol/L (ref 98–111)
Chloride: 102 mmol/L (ref 98–111)
Chloride: 103 mmol/L (ref 98–111)
Chloride: 100 mmol/L (ref 98–111)
Chloride: 104 mmol/L (ref 98–111)
Creatinine, Ser: 1 mg/dL (ref 0.61–1.24)
Creatinine, Ser: 1 mg/dL (ref 0.61–1.24)
Creatinine, Ser: 1 mg/dL (ref 0.61–1.24)
Creatinine, Ser: 0.9 mg/dL (ref 0.61–1.24)
Creatinine, Ser: 1 mg/dL (ref 0.61–1.24)
Glucose, Bld: 169 mg/dL — ABNORMAL HIGH (ref 70–99)
Glucose, Bld: 111 mg/dL — ABNORMAL HIGH (ref 70–99)
Glucose, Bld: 135 mg/dL — ABNORMAL HIGH (ref 70–99)
Glucose, Bld: 121 mg/dL — ABNORMAL HIGH (ref 70–99)
Glucose, Bld: 153 mg/dL — ABNORMAL HIGH (ref 70–99)
HCT: 40 % (ref 39.0–52.0)
HCT: 31 % — ABNORMAL LOW (ref 39.0–52.0)
HCT: 36 % — ABNORMAL LOW (ref 39.0–52.0)
HCT: 27 % — ABNORMAL LOW (ref 39.0–52.0)
HCT: 30 % — ABNORMAL LOW (ref 39.0–52.0)
Hemoglobin: 13.6 g/dL (ref 13.0–17.0)
Hemoglobin: 10.5 g/dL — ABNORMAL LOW (ref 13.0–17.0)
Hemoglobin: 12.2 g/dL — ABNORMAL LOW (ref 13.0–17.0)
Hemoglobin: 9.2 g/dL — ABNORMAL LOW (ref 13.0–17.0)
Hemoglobin: 10.2 g/dL — ABNORMAL LOW (ref 13.0–17.0)
Potassium: 4 mmol/L (ref 3.5–5.1)
Potassium: 3.9 mmol/L (ref 3.5–5.1)
Potassium: 4.3 mmol/L (ref 3.5–5.1)
Potassium: 4.2 mmol/L (ref 3.5–5.1)
Potassium: 3.7 mmol/L (ref 3.5–5.1)
Sodium: 140 mmol/L (ref 135–145)
Sodium: 141 mmol/L (ref 135–145)
Sodium: 141 mmol/L (ref 135–145)
Sodium: 137 mmol/L (ref 135–145)
Sodium: 139 mmol/L (ref 135–145)
TCO2: 27 mmol/L (ref 22–32)
TCO2: 25 mmol/L (ref 22–32)
TCO2: 27 mmol/L (ref 22–32)
TCO2: 26 mmol/L (ref 22–32)
TCO2: 24 mmol/L (ref 22–32)

## 2020-10-18 LAB — POCT I-STAT EG7
Acid-Base Excess: 2 mmol/L (ref 0.0–2.0)
Bicarbonate: 26.7 mmol/L (ref 20.0–28.0)
Calcium, Ion: 1.07 mmol/L — ABNORMAL LOW (ref 1.15–1.40)
HCT: 31 % — ABNORMAL LOW (ref 39.0–52.0)
Hemoglobin: 10.5 g/dL — ABNORMAL LOW (ref 13.0–17.0)
O2 Saturation: 75 %
Potassium: 3.6 mmol/L (ref 3.5–5.1)
Sodium: 143 mmol/L (ref 135–145)
TCO2: 28 mmol/L (ref 22–32)
pCO2, Ven: 43.1 mmHg — ABNORMAL LOW (ref 44.0–60.0)
pH, Ven: 7.4 (ref 7.250–7.430)
pO2, Ven: 40 mmHg (ref 32.0–45.0)

## 2020-10-18 LAB — CBC
HCT: 41.4 % (ref 39.0–52.0)
HCT: 33.7 % — ABNORMAL LOW (ref 39.0–52.0)
HCT: 37.2 % — ABNORMAL LOW (ref 39.0–52.0)
Hemoglobin: 13.8 g/dL (ref 13.0–17.0)
Hemoglobin: 11.3 g/dL — ABNORMAL LOW (ref 13.0–17.0)
Hemoglobin: 12.3 g/dL — ABNORMAL LOW (ref 13.0–17.0)
MCH: 30.3 pg (ref 26.0–34.0)
MCH: 31.2 pg (ref 26.0–34.0)
MCH: 30.6 pg (ref 26.0–34.0)
MCHC: 33.3 g/dL (ref 30.0–36.0)
MCHC: 33.5 g/dL (ref 30.0–36.0)
MCHC: 33.1 g/dL (ref 30.0–36.0)
MCV: 91 fL (ref 80.0–100.0)
MCV: 93.1 fL (ref 80.0–100.0)
MCV: 92.5 fL (ref 80.0–100.0)
Platelets: 267 10*3/uL (ref 150–400)
Platelets: 218 10*3/uL (ref 150–400)
Platelets: 259 10*3/uL (ref 150–400)
RBC: 4.55 MIL/uL (ref 4.22–5.81)
RBC: 3.62 MIL/uL — ABNORMAL LOW (ref 4.22–5.81)
RBC: 4.02 MIL/uL — ABNORMAL LOW (ref 4.22–5.81)
RDW: 12.5 % (ref 11.5–15.5)
RDW: 12.7 % (ref 11.5–15.5)
RDW: 12.9 % (ref 11.5–15.5)
WBC: 9 10*3/uL (ref 4.0–10.5)
WBC: 14.1 10*3/uL — ABNORMAL HIGH (ref 4.0–10.5)
WBC: 16.9 10*3/uL — ABNORMAL HIGH (ref 4.0–10.5)
nRBC: 0 % (ref 0.0–0.2)
nRBC: 0 % (ref 0.0–0.2)
nRBC: 0 % (ref 0.0–0.2)

## 2020-10-18 LAB — APTT
aPTT: 62 seconds — ABNORMAL HIGH (ref 24–36)
aPTT: 29 seconds (ref 24–36)

## 2020-10-18 LAB — MAGNESIUM: Magnesium: 3 mg/dL — ABNORMAL HIGH (ref 1.7–2.4)

## 2020-10-18 LAB — PROTIME-INR
INR: 1.3 — ABNORMAL HIGH (ref 0.8–1.2)
Prothrombin Time: 15.2 seconds (ref 11.4–15.2)

## 2020-10-18 LAB — PLATELET COUNT: Platelets: 231 10*3/uL (ref 150–400)

## 2020-10-18 LAB — HEMOGLOBIN AND HEMATOCRIT, BLOOD
HCT: 27.4 % — ABNORMAL LOW (ref 39.0–52.0)
Hemoglobin: 9.3 g/dL — ABNORMAL LOW (ref 13.0–17.0)

## 2020-10-18 LAB — ECHO INTRAOPERATIVE TEE
Height: 73 in
Weight: 3795.2 oz

## 2020-10-18 LAB — GLUCOSE, CAPILLARY
Glucose-Capillary: 110 mg/dL — ABNORMAL HIGH (ref 70–99)
Glucose-Capillary: 120 mg/dL — ABNORMAL HIGH (ref 70–99)
Glucose-Capillary: 122 mg/dL — ABNORMAL HIGH (ref 70–99)
Glucose-Capillary: 127 mg/dL — ABNORMAL HIGH (ref 70–99)
Glucose-Capillary: 128 mg/dL — ABNORMAL HIGH (ref 70–99)
Glucose-Capillary: 136 mg/dL — ABNORMAL HIGH (ref 70–99)
Glucose-Capillary: 149 mg/dL — ABNORMAL HIGH (ref 70–99)
Glucose-Capillary: 153 mg/dL — ABNORMAL HIGH (ref 70–99)
Glucose-Capillary: 177 mg/dL — ABNORMAL HIGH (ref 70–99)
Glucose-Capillary: 98 mg/dL (ref 70–99)

## 2020-10-18 LAB — HEPARIN LEVEL (UNFRACTIONATED): Heparin Unfractionated: 0.42 IU/mL (ref 0.30–0.70)

## 2020-10-18 SURGERY — CORONARY ARTERY BYPASS GRAFTING (CABG)
Anesthesia: General | Site: Chest

## 2020-10-18 MED ORDER — SODIUM CHLORIDE 0.9 % IV SOLN: 250.0000 mL | INTRAVENOUS | Status: DC

## 2020-10-18 MED ORDER — MILRINONE LACTATE IN DEXTROSE 20-5 MG/100ML-% IV SOLN: 0.3000 ug/kg/min | INTRAVENOUS | Status: DC

## 2020-10-18 MED ORDER — PHENYLEPHRINE HCL-NACL 20-0.9 MG/250ML-% IV SOLN
0.0000 ug/min | INTRAVENOUS | Status: DC
  Administered 2020-10-18: 25 ug/min via INTRAVENOUS

## 2020-10-18 MED ORDER — ACETAMINOPHEN 650 MG RE SUPP
650.0000 mg | Freq: Once | RECTAL | Status: AC
  Administered 2020-10-18: 650 mg via RECTAL

## 2020-10-18 MED ORDER — LACTATED RINGERS IV SOLN: INTRAVENOUS | Status: DC | PRN

## 2020-10-18 MED ORDER — FENTANYL CITRATE (PF) 250 MCG/5ML IJ SOLN
INTRAMUSCULAR | Status: AC
  Filled 2020-10-18: qty 25

## 2020-10-18 MED ORDER — ORAL CARE MOUTH RINSE
15.0000 mL | OROMUCOSAL | Status: DC
  Administered 2020-10-18: 15 mL via OROMUCOSAL

## 2020-10-18 MED ORDER — SODIUM CHLORIDE 0.9% FLUSH
3.0000 mL | Freq: Two times a day (BID) | INTRAVENOUS | Status: DC
  Administered 2020-10-19 – 2020-10-20 (×3): 3 mL via INTRAVENOUS

## 2020-10-18 MED ORDER — PANTOPRAZOLE SODIUM 40 MG PO TBEC
40.0000 mg | DELAYED_RELEASE_TABLET | Freq: Every day | ORAL | Status: DC
  Administered 2020-10-20 – 2020-10-29 (×10): 40 mg via ORAL
  Filled 2020-10-18 (×10): qty 1

## 2020-10-18 MED ORDER — FAMOTIDINE IN NACL 20-0.9 MG/50ML-% IV SOLN
20.0000 mg | Freq: Two times a day (BID) | INTRAVENOUS | Status: DC
  Administered 2020-10-18: 20 mg via INTRAVENOUS
  Filled 2020-10-18: qty 50

## 2020-10-18 MED ORDER — ASPIRIN EC 325 MG PO TBEC
325.0000 mg | DELAYED_RELEASE_TABLET | Freq: Every day | ORAL | Status: DC
  Filled 2020-10-18: qty 1

## 2020-10-18 MED ORDER — NITROGLYCERIN IN D5W 200-5 MCG/ML-% IV SOLN: 0.0000 ug/min | INTRAVENOUS | Status: DC

## 2020-10-18 MED ORDER — METOPROLOL TARTRATE 5 MG/5ML IV SOLN
2.5000 mg | INTRAVENOUS | Status: DC | PRN
  Administered 2020-10-22: 5 mg via INTRAVENOUS
  Administered 2020-10-22: 2.5 mg via INTRAVENOUS
  Administered 2020-10-23 – 2020-10-24 (×2): 5 mg via INTRAVENOUS
  Filled 2020-10-18 (×4): qty 5

## 2020-10-18 MED ORDER — PROTAMINE SULFATE 10 MG/ML IV SOLN
INTRAVENOUS | Status: AC
  Filled 2020-10-18: qty 5

## 2020-10-18 MED ORDER — NITROGLYCERIN IN D5W 200-5 MCG/ML-% IV SOLN
INTRAVENOUS | Status: DC | PRN
  Administered 2020-10-18: 20 ug/min via INTRAVENOUS

## 2020-10-18 MED ORDER — BISACODYL 5 MG PO TBEC
10.0000 mg | DELAYED_RELEASE_TABLET | Freq: Every day | ORAL | Status: DC
  Administered 2020-10-20 – 2020-10-21 (×2): 10 mg via ORAL
  Filled 2020-10-18 (×4): qty 2

## 2020-10-18 MED ORDER — ONDANSETRON HCL 4 MG/2ML IJ SOLN
INTRAMUSCULAR | Status: DC | PRN
  Administered 2020-10-18: 4 mg via INTRAVENOUS

## 2020-10-18 MED ORDER — PHENYLEPHRINE 40 MCG/ML (10ML) SYRINGE FOR IV PUSH (FOR BLOOD PRESSURE SUPPORT)
PREFILLED_SYRINGE | INTRAVENOUS | Status: AC
  Filled 2020-10-18: qty 10

## 2020-10-18 MED ORDER — ASPIRIN 81 MG PO CHEW: 324.0000 mg | CHEWABLE_TABLET | Freq: Every day | ORAL | Status: DC

## 2020-10-18 MED ORDER — SODIUM CHLORIDE 0.9 % IV SOLN: INTRAVENOUS | Status: DC

## 2020-10-18 MED ORDER — TRAMADOL HCL 50 MG PO TABS
50.0000 mg | ORAL_TABLET | ORAL | Status: DC | PRN
  Administered 2020-10-18 – 2020-10-19 (×4): 100 mg via ORAL
  Filled 2020-10-18 (×4): qty 2

## 2020-10-18 MED ORDER — INSULIN REGULAR(HUMAN) IN NACL 100-0.9 UT/100ML-% IV SOLN
INTRAVENOUS | Status: DC | PRN
  Administered 2020-10-18: 5.5 [IU]/h via INTRAVENOUS

## 2020-10-18 MED ORDER — METOPROLOL TARTRATE 12.5 MG HALF TABLET
12.5000 mg | ORAL_TABLET | Freq: Two times a day (BID) | ORAL | Status: DC
  Administered 2020-10-19: 12.5 mg via ORAL
  Filled 2020-10-18: qty 1

## 2020-10-18 MED ORDER — PROTAMINE SULFATE 10 MG/ML IV SOLN
INTRAVENOUS | Status: DC | PRN
  Administered 2020-10-18: 320 mg via INTRAVENOUS

## 2020-10-18 MED ORDER — PLASMA-LYTE 148 IV SOLN
INTRAVENOUS | Status: DC | PRN
  Administered 2020-10-18: 500 mL via INTRAVASCULAR

## 2020-10-18 MED ORDER — ARTIFICIAL TEARS OPHTHALMIC OINT
TOPICAL_OINTMENT | OPHTHALMIC | Status: AC
  Filled 2020-10-18: qty 3.5

## 2020-10-18 MED ORDER — DOBUTAMINE IN D5W 4-5 MG/ML-% IV SOLN: 2.5000 ug/kg/min | INTRAVENOUS | Status: DC

## 2020-10-18 MED ORDER — MIDAZOLAM HCL (PF) 5 MG/ML IJ SOLN
INTRAMUSCULAR | Status: DC | PRN
  Administered 2020-10-18: 1 mg via INTRAVENOUS
  Administered 2020-10-18 (×2): 2 mg via INTRAVENOUS

## 2020-10-18 MED ORDER — SODIUM CHLORIDE 0.9% FLUSH: 10.0000 mL | INTRAVENOUS | Status: DC | PRN

## 2020-10-18 MED ORDER — MIDAZOLAM HCL 2 MG/2ML IJ SOLN: 2.0000 mg | INTRAMUSCULAR | Status: DC | PRN

## 2020-10-18 MED ORDER — DOCUSATE SODIUM 100 MG PO CAPS
200.0000 mg | ORAL_CAPSULE | Freq: Every day | ORAL | Status: DC
  Administered 2020-10-19 – 2020-10-23 (×4): 200 mg via ORAL
  Filled 2020-10-18 (×5): qty 2

## 2020-10-18 MED ORDER — VANCOMYCIN HCL IN DEXTROSE 1-5 GM/200ML-% IV SOLN
1000.0000 mg | Freq: Once | INTRAVENOUS | Status: AC
  Administered 2020-10-18: 1000 mg via INTRAVENOUS
  Filled 2020-10-18: qty 200

## 2020-10-18 MED ORDER — CHLORHEXIDINE GLUCONATE 0.12% ORAL RINSE (MEDLINE KIT): 15.0000 mL | Freq: Two times a day (BID) | OROMUCOSAL | Status: DC

## 2020-10-18 MED ORDER — VANCOMYCIN HCL 1000 MG IV SOLR
INTRAVENOUS | Status: DC | PRN
  Administered 2020-10-18: 1500 mg via INTRAVENOUS

## 2020-10-18 MED ORDER — LACTATED RINGERS IV SOLN: INTRAVENOUS | Status: DC

## 2020-10-18 MED ORDER — INSULIN REGULAR(HUMAN) IN NACL 100-0.9 UT/100ML-% IV SOLN: INTRAVENOUS | Status: DC

## 2020-10-18 MED ORDER — PROPOFOL 10 MG/ML IV BOLUS
INTRAVENOUS | Status: AC
  Filled 2020-10-18: qty 20

## 2020-10-18 MED ORDER — EPHEDRINE SULFATE-NACL 50-0.9 MG/10ML-% IV SOSY
PREFILLED_SYRINGE | INTRAVENOUS | Status: DC | PRN
  Administered 2020-10-18: 5 mg via INTRAVENOUS
  Administered 2020-10-18: 10 mg via INTRAVENOUS

## 2020-10-18 MED ORDER — ONDANSETRON HCL 4 MG/2ML IJ SOLN
4.0000 mg | Freq: Four times a day (QID) | INTRAMUSCULAR | Status: DC | PRN
  Administered 2020-10-20: 4 mg via INTRAVENOUS
  Filled 2020-10-18: qty 2

## 2020-10-18 MED ORDER — NOREPINEPHRINE 4 MG/250ML-% IV SOLN: 0.0000 ug/min | INTRAVENOUS | Status: DC

## 2020-10-18 MED ORDER — EPHEDRINE 5 MG/ML INJ
INTRAVENOUS | Status: AC
  Filled 2020-10-18: qty 10

## 2020-10-18 MED ORDER — 0.9 % SODIUM CHLORIDE (POUR BTL) OPTIME
TOPICAL | Status: DC | PRN
  Administered 2020-10-18: 5000 mL

## 2020-10-18 MED ORDER — SODIUM CHLORIDE (PF) 0.9 % IJ SOLN
OROMUCOSAL | Status: DC | PRN
  Administered 2020-10-18: 8 mL via TOPICAL

## 2020-10-18 MED ORDER — SODIUM CHLORIDE 0.9 % IV SOLN
1.5000 g | Freq: Two times a day (BID) | INTRAVENOUS | Status: AC
  Administered 2020-10-18 – 2020-10-20 (×4): 1.5 g via INTRAVENOUS
  Filled 2020-10-18 (×4): qty 1.5

## 2020-10-18 MED ORDER — ACETAMINOPHEN 160 MG/5ML PO SOLN: 650.0000 mg | Freq: Once | ORAL | Status: AC

## 2020-10-18 MED ORDER — PROPOFOL 10 MG/ML IV BOLUS
INTRAVENOUS | Status: DC | PRN
  Administered 2020-10-18 (×2): 30 mg via INTRAVENOUS
  Administered 2020-10-18: 50 mg via INTRAVENOUS

## 2020-10-18 MED ORDER — DEXMEDETOMIDINE HCL IN NACL 400 MCG/100ML IV SOLN
0.0000 ug/kg/h | INTRAVENOUS | Status: DC
  Administered 2020-10-18: 0.1 ug/kg/h via INTRAVENOUS
  Filled 2020-10-18: qty 100

## 2020-10-18 MED ORDER — TRANEXAMIC ACID (OHS) BOLUS VIA INFUSION
15.0000 mg/kg | INTRAVENOUS | Status: AC
  Administered 2020-10-18: 1590 mg via INTRAVENOUS

## 2020-10-18 MED ORDER — METOCLOPRAMIDE HCL 5 MG/ML IJ SOLN
10.0000 mg | Freq: Four times a day (QID) | INTRAMUSCULAR | Status: AC
  Administered 2020-10-18 – 2020-10-19 (×3): 10 mg via INTRAVENOUS
  Filled 2020-10-18 (×3): qty 2

## 2020-10-18 MED ORDER — HEPARIN SODIUM (PORCINE) 1000 UNIT/ML IJ SOLN
INTRAMUSCULAR | Status: AC
  Filled 2020-10-18: qty 1

## 2020-10-18 MED ORDER — ROCURONIUM BROMIDE 10 MG/ML (PF) SYRINGE
PREFILLED_SYRINGE | INTRAVENOUS | Status: DC | PRN
  Administered 2020-10-18 (×2): 50 mg via INTRAVENOUS
  Administered 2020-10-18: 40 mg via INTRAVENOUS
  Administered 2020-10-18: 30 mg via INTRAVENOUS
  Administered 2020-10-18: 60 mg via INTRAVENOUS

## 2020-10-18 MED ORDER — TRANEXAMIC ACID 1000 MG/10ML IV SOLN
INTRAVENOUS | Status: DC | PRN
  Administered 2020-10-18: 1.5 mg/kg/h via INTRAVENOUS

## 2020-10-18 MED ORDER — ALBUMIN HUMAN 5 % IV SOLN: 250.0000 mL | INTRAVENOUS | Status: AC | PRN

## 2020-10-18 MED ORDER — MIDAZOLAM HCL (PF) 10 MG/2ML IJ SOLN
INTRAMUSCULAR | Status: AC
  Filled 2020-10-18: qty 2

## 2020-10-18 MED ORDER — ALBUMIN HUMAN 5 % IV SOLN: INTRAVENOUS | Status: DC | PRN

## 2020-10-18 MED ORDER — SODIUM CHLORIDE 0.9% FLUSH
10.0000 mL | Freq: Two times a day (BID) | INTRAVENOUS | Status: DC
  Administered 2020-10-18: 20 mL
  Administered 2020-10-18 – 2020-10-20 (×3): 10 mL

## 2020-10-18 MED ORDER — FENTANYL CITRATE (PF) 100 MCG/2ML IJ SOLN
INTRAMUSCULAR | Status: DC | PRN
  Administered 2020-10-18: 100 ug via INTRAVENOUS
  Administered 2020-10-18: 300 ug via INTRAVENOUS
  Administered 2020-10-18: 100 ug via INTRAVENOUS
  Administered 2020-10-18: 50 ug via INTRAVENOUS
  Administered 2020-10-18 (×3): 150 ug via INTRAVENOUS
  Administered 2020-10-18: 100 ug via INTRAVENOUS
  Administered 2020-10-18: 50 ug via INTRAVENOUS
  Administered 2020-10-18: 100 ug via INTRAVENOUS

## 2020-10-18 MED ORDER — EPINEPHRINE HCL 5 MG/250ML IV SOLN IN NS: 0.0000 ug/min | INTRAVENOUS | Status: DC

## 2020-10-18 MED ORDER — METOPROLOL TARTRATE 25 MG/10 ML ORAL SUSPENSION: 12.5000 mg | Freq: Two times a day (BID) | ORAL | Status: DC

## 2020-10-18 MED ORDER — DEXTROSE 50 % IV SOLN: 0.0000 mL | INTRAVENOUS | Status: DC | PRN

## 2020-10-18 MED ORDER — ROCURONIUM BROMIDE 10 MG/ML (PF) SYRINGE
PREFILLED_SYRINGE | INTRAVENOUS | Status: AC
  Filled 2020-10-18: qty 20

## 2020-10-18 MED ORDER — LACTATED RINGERS IV SOLN: 500.0000 mL | Freq: Once | INTRAVENOUS | Status: DC | PRN

## 2020-10-18 MED ORDER — DEXMEDETOMIDINE HCL IN NACL 400 MCG/100ML IV SOLN
INTRAVENOUS | Status: DC | PRN
  Administered 2020-10-18: .3 ug/kg/h via INTRAVENOUS

## 2020-10-18 MED ORDER — OXYCODONE HCL 5 MG PO TABS
5.0000 mg | ORAL_TABLET | ORAL | Status: DC | PRN
  Administered 2020-10-18 – 2020-10-19 (×3): 10 mg via ORAL
  Filled 2020-10-18: qty 1
  Filled 2020-10-18 (×3): qty 2

## 2020-10-18 MED ORDER — CHLORHEXIDINE GLUCONATE 0.12 % MT SOLN
15.0000 mL | OROMUCOSAL | Status: AC
  Administered 2020-10-18: 15 mL via OROMUCOSAL

## 2020-10-18 MED ORDER — MAGNESIUM SULFATE 4 GM/100ML IV SOLN
4.0000 g | Freq: Once | INTRAVENOUS | Status: AC
  Administered 2020-10-18: 4 g via INTRAVENOUS
  Filled 2020-10-18: qty 100

## 2020-10-18 MED ORDER — SODIUM CHLORIDE 0.9 % IV SOLN
INTRAVENOUS | Status: DC | PRN
  Administered 2020-10-18: 1.5 g via INTRAVENOUS

## 2020-10-18 MED ORDER — PROTAMINE SULFATE 10 MG/ML IV SOLN
INTRAVENOUS | Status: AC
  Filled 2020-10-18: qty 25

## 2020-10-18 MED ORDER — SODIUM CHLORIDE 0.9 % IV SOLN
INTRAVENOUS | Status: DC | PRN
  Administered 2020-10-18: 750 mg via INTRAVENOUS

## 2020-10-18 MED ORDER — ARTIFICIAL TEARS OPHTHALMIC OINT
TOPICAL_OINTMENT | OPHTHALMIC | Status: DC | PRN
  Administered 2020-10-18: 1 via OPHTHALMIC

## 2020-10-18 MED ORDER — BISACODYL 10 MG RE SUPP: 10.0000 mg | Freq: Every day | RECTAL | Status: DC

## 2020-10-18 MED ORDER — CHLORHEXIDINE GLUCONATE CLOTH 2 % EX PADS
6.0000 | MEDICATED_PAD | Freq: Every day | CUTANEOUS | Status: DC
  Administered 2020-10-18 – 2020-10-22 (×5): 6 via TOPICAL

## 2020-10-18 MED ORDER — CHLORHEXIDINE GLUCONATE CLOTH 2 % EX PADS: 6.0000 | MEDICATED_PAD | Freq: Every day | CUTANEOUS | Status: DC

## 2020-10-18 MED ORDER — ONDANSETRON HCL 4 MG/2ML IJ SOLN
INTRAMUSCULAR | Status: AC
  Filled 2020-10-18: qty 2

## 2020-10-18 MED ORDER — ACETAMINOPHEN 160 MG/5ML PO SOLN: 1000.0000 mg | Freq: Four times a day (QID) | ORAL | Status: AC

## 2020-10-18 MED ORDER — SODIUM CHLORIDE 0.45 % IV SOLN: INTRAVENOUS | Status: DC | PRN

## 2020-10-18 MED ORDER — ACETAMINOPHEN 500 MG PO TABS
1000.0000 mg | ORAL_TABLET | Freq: Four times a day (QID) | ORAL | Status: AC
  Administered 2020-10-18 – 2020-10-23 (×18): 1000 mg via ORAL
  Filled 2020-10-18 (×19): qty 2

## 2020-10-18 MED ORDER — MANNITOL 20 % IV SOLN
INTRAVENOUS | Status: DC
  Filled 2020-10-18: qty 13

## 2020-10-18 MED ORDER — NITROGLYCERIN 0.2 MG/ML ON CALL CATH LAB
INTRAVENOUS | Status: DC | PRN
  Administered 2020-10-18 (×2): 20 ug via INTRAVENOUS

## 2020-10-18 MED ORDER — METOPROLOL TARTRATE 25 MG PO TABS
25.0000 mg | ORAL_TABLET | Freq: Once | ORAL | Status: AC
  Administered 2020-10-18: 25 mg via ORAL
  Filled 2020-10-18: qty 1

## 2020-10-18 MED ORDER — HEPARIN SODIUM (PORCINE) 1000 UNIT/ML IJ SOLN
INTRAMUSCULAR | Status: DC | PRN
  Administered 2020-10-18: 35000 [IU] via INTRAVENOUS

## 2020-10-18 MED ORDER — PHENYLEPHRINE HCL-NACL 10-0.9 MG/250ML-% IV SOLN
INTRAVENOUS | Status: DC | PRN
  Administered 2020-10-18: 20 ug/min via INTRAVENOUS

## 2020-10-18 MED ORDER — SODIUM CHLORIDE 0.9% FLUSH: 3.0000 mL | INTRAVENOUS | Status: DC | PRN

## 2020-10-18 MED ORDER — POTASSIUM CHLORIDE 10 MEQ/50ML IV SOLN
10.0000 meq | INTRAVENOUS | Status: AC
  Administered 2020-10-18 (×3): 10 meq via INTRAVENOUS

## 2020-10-18 MED ORDER — PHENYLEPHRINE 40 MCG/ML (10ML) SYRINGE FOR IV PUSH (FOR BLOOD PRESSURE SUPPORT)
PREFILLED_SYRINGE | INTRAVENOUS | Status: DC | PRN
  Administered 2020-10-18: 40 ug via INTRAVENOUS

## 2020-10-18 MED ORDER — MORPHINE SULFATE (PF) 2 MG/ML IV SOLN
1.0000 mg | INTRAVENOUS | Status: DC | PRN
  Administered 2020-10-18 – 2020-10-19 (×6): 2 mg via INTRAVENOUS
  Filled 2020-10-18: qty 2
  Filled 2020-10-18 (×2): qty 1
  Filled 2020-10-18: qty 2
  Filled 2020-10-18: qty 1

## 2020-10-18 SURGICAL SUPPLY — 94 items
ATRICLIP EXCLUSION VLAA SYSTEM (Miscellaneous) ×4 IMPLANT
BAG DECANTER FOR FLEXI CONT (MISCELLANEOUS) ×4 IMPLANT
BLADE CLIPPER SURG (BLADE) ×4 IMPLANT
BLADE STERNUM SYSTEM 6 (BLADE) ×4 IMPLANT
BLADE SURG 11 STRL SS (BLADE) ×4 IMPLANT
BNDG ELASTIC 4X5.8 VLCR STR LF (GAUZE/BANDAGES/DRESSINGS) ×4 IMPLANT
BNDG ELASTIC 6X5.8 VLCR STR LF (GAUZE/BANDAGES/DRESSINGS) ×4 IMPLANT
BNDG GAUZE ELAST 4 BULKY (GAUZE/BANDAGES/DRESSINGS) ×4 IMPLANT
CABLE SURGICAL S-101-97-12 (CABLE) ×4 IMPLANT
CANISTER SUCT 3000ML PPV (MISCELLANEOUS) ×4 IMPLANT
CANNULA AORTIC ROOT 9FR (CANNULA) ×4 IMPLANT
CANNULA MC2 2 STG 29/37 NON-V (CANNULA) ×2 IMPLANT
CANNULA MC2 TWO STAGE (CANNULA) ×2
CANNULA NON VENT 20FR 12 (CANNULA) ×4 IMPLANT
CANNULA NON VENT 22FR 12 (CANNULA) ×4 IMPLANT
CATH ROBINSON RED A/P 18FR (CATHETERS) ×8 IMPLANT
CLIP RETRACTION 3.0MM CORONARY (MISCELLANEOUS) ×4 IMPLANT
CLIP VESOCCLUDE MED 24/CT (CLIP) ×4 IMPLANT
CNTNR URN SCR LID CUP LEK RST (MISCELLANEOUS) ×2 IMPLANT
CONNECTOR BLAKE 2:1 CARIO BLK (MISCELLANEOUS) ×4 IMPLANT
CONT SPEC 4OZ STRL OR WHT (MISCELLANEOUS) ×2
DEFOGGER ANTIFOG KIT (MISCELLANEOUS) ×4 IMPLANT
DERMABOND ADVANCED (GAUZE/BANDAGES/DRESSINGS) ×2
DERMABOND ADVANCED .7 DNX12 (GAUZE/BANDAGES/DRESSINGS) ×2 IMPLANT
DRAIN CHANNEL 19F RND (DRAIN) ×8 IMPLANT
DRAPE CARDIOVASCULAR INCISE (DRAPES) ×2
DRAPE SLUSH/WARMER DISC (DRAPES) ×4 IMPLANT
DRAPE SRG 135X102X78XABS (DRAPES) ×2 IMPLANT
DRSG AQUACEL AG ADV 3.5X14 (GAUZE/BANDAGES/DRESSINGS) ×4 IMPLANT
DRSG COVADERM 4X14 (GAUZE/BANDAGES/DRESSINGS) ×4 IMPLANT
ELECT BLADE 4.0 EZ CLEAN MEGAD (MISCELLANEOUS) ×4
ELECT REM PT RETURN 9FT ADLT (ELECTROSURGICAL) ×8
ELECTRODE BLDE 4.0 EZ CLN MEGD (MISCELLANEOUS) ×2 IMPLANT
ELECTRODE REM PT RTRN 9FT ADLT (ELECTROSURGICAL) ×4 IMPLANT
FELT TEFLON 1X6 (MISCELLANEOUS) ×4 IMPLANT
FIBERTAPE STERNAL CLSR 2 36IN (SUTURE) ×12 IMPLANT
FIBERTAPE STERNAL CLSR 2X36 (SUTURE) ×16 IMPLANT
GAUZE SPONGE 4X4 12PLY STRL (GAUZE/BANDAGES/DRESSINGS) ×8 IMPLANT
GAUZE SPONGE 4X4 12PLY STRL LF (GAUZE/BANDAGES/DRESSINGS) ×8 IMPLANT
GLOVE BIO SURGEON STRL SZ 6.5 (GLOVE) ×6 IMPLANT
GLOVE BIO SURGEON STRL SZ7 (GLOVE) ×8 IMPLANT
GLOVE BIO SURGEONS STRL SZ 6.5 (GLOVE) ×2
GLOVE BIOGEL PI IND STRL 6.5 (GLOVE) ×4 IMPLANT
GLOVE BIOGEL PI IND STRL 8 (GLOVE) ×6 IMPLANT
GLOVE BIOGEL PI INDICATOR 6.5 (GLOVE) ×4
GLOVE BIOGEL PI INDICATOR 8 (GLOVE) ×6
GOWN STRL REUS W/ TWL LRG LVL3 (GOWN DISPOSABLE) ×20 IMPLANT
GOWN STRL REUS W/ TWL XL LVL3 (GOWN DISPOSABLE) ×6 IMPLANT
GOWN STRL REUS W/TWL LRG LVL3 (GOWN DISPOSABLE) ×20
GOWN STRL REUS W/TWL XL LVL3 (GOWN DISPOSABLE) ×6
HEMOSTAT POWDER SURGIFOAM 1G (HEMOSTASIS) ×8 IMPLANT
INSERT SUTURE HOLDER (MISCELLANEOUS) ×4 IMPLANT
KIT BASIN OR (CUSTOM PROCEDURE TRAY) ×4 IMPLANT
KIT DRAINAGE VACCUM ASSIST (KITS) ×4 IMPLANT
KIT SUCTION CATH 14FR (SUCTIONS) ×4 IMPLANT
KIT TURNOVER KIT B (KITS) ×4 IMPLANT
KIT VASOVIEW HEMOPRO 2 VH 4000 (KITS) ×4 IMPLANT
LEAD PACING MYOCARDI (MISCELLANEOUS) ×4 IMPLANT
MARKER GRAFT CORONARY BYPASS (MISCELLANEOUS) ×12 IMPLANT
NDL SUT PASSING CERCLAGE MED (SUTURE) ×4
NEEDLE SUT PASSING CERCLAG MED (SUTURE) ×2 IMPLANT
NS IRRIG 1000ML POUR BTL (IV SOLUTION) ×20 IMPLANT
PACK ACCESSORY CANNULA KIT (KITS) ×4 IMPLANT
PACK E OPEN HEART (SUTURE) ×4 IMPLANT
PACK OPEN HEART (CUSTOM PROCEDURE TRAY) ×4 IMPLANT
PAD ARMBOARD 7.5X6 YLW CONV (MISCELLANEOUS) ×8 IMPLANT
PAD ELECT DEFIB RADIOL ZOLL (MISCELLANEOUS) ×4 IMPLANT
PENCIL BUTTON HOLSTER BLD 10FT (ELECTRODE) ×4 IMPLANT
POSITIONER HEAD DONUT 9IN (MISCELLANEOUS) ×4 IMPLANT
PUNCH AORTIC ROTATE 4.0MM (MISCELLANEOUS) ×4 IMPLANT
SET CARDIOPLEGIA MPS 5001102 (MISCELLANEOUS) ×4 IMPLANT
SUPPORT HEART JANKE-BARRON (MISCELLANEOUS) ×4 IMPLANT
SUT BONE WAX W31G (SUTURE) ×4 IMPLANT
SUT ETHIBOND X763 2 0 SH 1 (SUTURE) ×8 IMPLANT
SUT MNCRL AB 3-0 PS2 18 (SUTURE) ×8 IMPLANT
SUT MNCRL AB 4-0 PS2 18 (SUTURE) ×4 IMPLANT
SUT PDS AB 1 CTX 36 (SUTURE) ×8 IMPLANT
SUT PROLENE 4 0 RB 1 (SUTURE) ×2
SUT PROLENE 4 0 SH DA (SUTURE) ×4 IMPLANT
SUT PROLENE 4-0 RB1 .5 CRCL 36 (SUTURE) ×2 IMPLANT
SUT PROLENE 5 0 C 1 36 (SUTURE) ×16 IMPLANT
SUT PROLENE 7 0 BV1 MDA (SUTURE) ×12 IMPLANT
SUT STEEL 6MS V (SUTURE) ×12 IMPLANT
SUT VIC AB 2-0 CT1 27 (SUTURE) ×2
SUT VIC AB 2-0 CT1 TAPERPNT 27 (SUTURE) ×2 IMPLANT
SYSTEM EXCLUSION ATRICLIP VLAA (Miscellaneous) ×2 IMPLANT
SYSTEM SAHARA CHEST DRAIN ATS (WOUND CARE) ×4 IMPLANT
TAPE PAPER 3X10 WHT MICROPORE (GAUZE/BANDAGES/DRESSINGS) ×4 IMPLANT
TOWEL GREEN STERILE (TOWEL DISPOSABLE) ×4 IMPLANT
TOWEL GREEN STERILE FF (TOWEL DISPOSABLE) ×4 IMPLANT
TRAY FOLEY SLVR 16FR TEMP STAT (SET/KITS/TRAYS/PACK) ×4 IMPLANT
TUBING LAP HI FLOW INSUFFLATIO (TUBING) ×4 IMPLANT
UNDERPAD 30X36 HEAVY ABSORB (UNDERPADS AND DIAPERS) ×4 IMPLANT
WATER STERILE IRR 1000ML POUR (IV SOLUTION) ×8 IMPLANT

## 2020-10-18 NOTE — Op Note (Signed)
[address]       [place] [address]             [phone]                                          10/18/2020 Patient:  Herbert Torres Pre-Op Dx:  NSTEMI   3V CAD   HTN    Post-op Dx:  same Procedure: CABG X 4.  LIMA LAD, RSVG, OM3, OM1, D1   Endoscopic greater saphenous vein harvest on the left 48mm Atriclip placement  Surgeon and Role:      * Rony Ratz, Eliezer Lofts, MD - Primary    * E. Barrett, PA-C - assisting Anesthesia  general EBL:  500 ml Blood Administration: none Xclamp Time:  73 min Pump Time:  127 min  Drains: 19 F blake drain:  L, mediastinal  Wires: none Counts: correct   Indications: 74 year old male transferred from [facility] for an NSTEMI.  He admits to a 2 to 3-week history of progressive angina.  He does have a history of paroxysmal atrial fibrillation on Eliquis for this.  CTS has been consulted to assist with management.  LHC showed severe 3 V CAD.  Findings: PDA was too small for bypass.  Good conduit.  Good sized LIMA.  Calcified LAD proximally.  Calcified OM3.  Small OM1.  Small D1.  Good flows on all vein grafts.  Good post CPB function.  Operative Technique: All invasive lines were placed in pre-op holding.  After the risks, benefits and alternatives were thoroughly discussed, the patient was brought to the operative theatre.  Anesthesia was induced, and the patient was prepped and draped in normal sterile fashion.  An appropriate surgical pause was performed, and pre-operative antibiotics were dosed accordingly.  We began with simultaneous incisions along the left leg for harvesting of the greater saphenous vein and the chest for the sternotomy.  In regards to the sternotomy, this was carried down with bovie cautery, and the sternum was divided with a reciprocating saw.  Meticulous hemostasis was obtained.  The left internal thoracic artery was exposed and harvested in in pedicled fashion.  The patient was  systemically heparinized, and the artery was divided distally, and placed in a papaverine sponge.    The sternal elevator was removed, and a retractor was placed.  The pericardium was divided in the midline and fashioned into a cradle with pericardial stitches.   After we confirmed an appropriate ACT, the ascending aorta was cannulated in standard fashion.  The right atrial appendage was used for venous cannulation site.  Cardiopulmonary bypass was initiated, and the heart retractor was placed. The cross clamp was applied, and a dose of anterograde cardioplegia was given with good arrest of the heart.  Next we exposed the lateral wall, and found a good target on the OM3 and OM1.  An end to side anastomosis with the vein graft was then created to each separately.  Next, we exposed the anterior wall of the heart and identified a good target on first diagonal.   An arteriotomy was created.  The vein was anastomosed in an end to side fashion.  Finally, we exposed a good target on the LAD, and fashioned an end to side anastomosis between it and the LITA.  We began to re-warm, and a re-animation dose of cardioplegia  was given.  The heart was de-aired, and the cross clamp was removed.  Meticulous hemostasis was obtained.    A partial occludding clamp was then placed on the ascending aorta, and we created an end to side anastomosis between it and the proximal vein grafts.  The proximal sites were marked with rings.  Hemostasis was obtained, and we separated from cardiopulmonary bypass without event.the heparin was reversed with protamine.  Chest tubes and wires were placed, and the sternum was re-approximated with with sternal wires.  The soft tissue and skin were re-approximated wth absorbable suture.    The patient tolerated the procedure without any immediate complications, and was transferred to the ICU in guarded condition.  Herbert Torres Scrape

## 2020-10-18 NOTE — Anesthesia Procedure Notes (Signed)
Central Venous Catheter Insertion Performed by: Kipp Brood, MD, anesthesiologist Start/End10/27/2021 6:50 AM, 10/18/2020 7:00 AM Patient location: Pre-op. Preanesthetic checklist: patient identified, IV checked, site marked, risks and benefits discussed, surgical consent, monitors and equipment checked, pre-op evaluation, timeout performed and anesthesia consent Lidocaine 1% used for infiltration and patient sedated Hand hygiene performed  and maximum sterile barriers used  Catheter size: 8.5 Fr PA cath was placed.Sheath introducer Swan type:thermodilution Procedure performed without using ultrasound guided technique. Ultrasound Notes:anatomy identified, needle tip was noted to be adjacent to the nerve/plexus identified, no ultrasound evidence of intravascular and/or intraneural injection and image(s) printed for medical record Attempts: 1 Following insertion, line sutured and dressing applied. Post procedure assessment: blood return through all ports, free fluid flow and no air  Patient tolerated the procedure well with no immediate complications.

## 2020-10-18 NOTE — Progress Notes (Signed)
  Echocardiogram Echocardiogram Transesophageal has been performed.  Augustine Radar 10/18/2020, 8:52 AM

## 2020-10-18 NOTE — Progress Notes (Signed)
Patient ID: Herbert Torres, male   DOB: 06-Mar-1946, 74 y.o.   MRN: [id number]  TCTS Evening Rounds:   Hemodynamically stable  CI = 3.4  Just extubated  Urine output good  CT output low  CBC    Component Value Date/Time   WBC 14.1 (H) 10/18/2020 1325   RBC 3.62 (L) 10/18/2020 1325   HGB 11.3 (L) 10/18/2020 1325   HCT 33.7 (L) 10/18/2020 1325   PLT 218 10/18/2020 1325   MCV 93.1 10/18/2020 1325   MCH 31.2 10/18/2020 1325   MCHC 33.5 10/18/2020 1325   RDW 12.7 10/18/2020 1325   LYMPHSABS 1.6 10/15/2020 0958   MONOABS 0.4 10/15/2020 0958   EOSABS 0.3 10/15/2020 0958   BASOSABS 0.1 10/15/2020 0958     BMET    Component Value Date/Time   NA 139 10/18/2020 1223   K 3.7 10/18/2020 1223   CL 104 10/18/2020 1223   CO2 23 10/18/2020 0148   GLUCOSE 153 (H) 10/18/2020 1223   BUN 11 10/18/2020 1223   CREATININE 1.00 10/18/2020 1223   CALCIUM 9.1 10/18/2020 0148   GFRNONAA >60 10/18/2020 0148     A/P:  Stable postop course. Continue current plans

## 2020-10-18 NOTE — Anesthesia Preprocedure Evaluation (Addendum)
Anesthesia Evaluation  Patient identified by MRN, date of birth, ID band Patient awake    Reviewed: Allergy & Precautions, NPO status , Patient's Chart, lab work & pertinent test results  Airway Mallampati: II  TM Distance: >3 FB Neck ROM: Full    Dental  (+) Edentulous Upper, Edentulous Lower   Pulmonary former smoker,    breath sounds clear to auscultation       Cardiovascular hypertension,  Rhythm:Regular Rate:Normal     Neuro/Psych    GI/Hepatic   Endo/Other  diabetes  Renal/GU      Musculoskeletal   Abdominal   Peds  Hematology   Anesthesia Other Findings   Reproductive/Obstetrics                             Anesthesia Physical Anesthesia Plan  ASA: IV  Anesthesia Plan: General   Post-op Pain Management:    Induction: Intravenous  PONV Risk Score and Plan: Ondansetron and Dexamethasone  Airway Management Planned: Oral ETT  Additional Equipment: Arterial line, CVP, 3D TEE and Ultrasound Guidance Line Placement  Intra-op Plan:   Post-operative Plan: Post-operative intubation/ventilation  Informed Consent: I have reviewed the patients History and Physical, chart, labs and discussed the procedure including the risks, benefits and alternatives for the proposed anesthesia with the patient or authorized representative who has indicated his/her understanding and acceptance.       Plan Discussed with: CRNA and Anesthesiologist  Anesthesia Plan Comments:         Anesthesia Quick Evaluation

## 2020-10-18 NOTE — Progress Notes (Signed)
Pre op AM CHG  bath completed by NT.  Cardiac rhythm change  noted , converted from Afib to SR per cardiac monitor  at this time.  Patient stated was able to  sleep and felt better this AM.denies pain nor any discomforts. Vital signs checked /stable. NPO maintained. Awaiting transport to OR this AM.

## 2020-10-18 NOTE — Procedures (Signed)
Extubation Procedure Note  Patient Details:   Name: Herbert Torres DOB: 02/23/1946 MRN: [id number]   Airway Documentation:    Vent end date: 10/18/20 Vent end time: 1742   Evaluation  O2 sats: stable throughout Complications: No apparent complications Patient did tolerate procedure well. Bilateral Breath Sounds: Clear, Diminished   Yes   Patient was extubated to a 4L [place] without any complications, dyspnea or stridor noted. Patient was instructed on IS x 5, highest goal reached was . NIF: -20, VC: 700 (MD made aware by RN verbal orders were given to continue with extubation.) Positive cuff leak noted prior to extubation.   Carlynn Spry 10/18/2020, 5:42 PM

## 2020-10-18 NOTE — Hospital Course (Addendum)
History of Present Illness:  Mr. Herbert Torres is a 74 yo male with known history of Paroxysmal Atrial Fibrillation on Eliquis, DM, Hypertension, Hyperlipidemia, and previous tobacco abuse, having quit 30 years ago.  He presented to [facility] with complaints of chest pain.  The pain had been present intermittently for 2-3 weeks prior to presentation.  The patient described the pain as a heaviness with radiation across his chest,  The pain was exacerbated with exertion or bending over.  He denied associated shortness of breath, nausea, and diaphoresis.  Workup in the ED consisted of EKG which showed no ischemic changes other than some mild ST depression in the lateral lead.  Troponin was elevated at 266.  He was ruled in for NSTEMI, started on IV heparin and transferred to [facility] for further care.   Hospital Course:  Upon arrival to [facility] the patient was chest pain free.  He was admitted to the Cardiology service.  Repeat Troponin level was obtained and peaked at 458.  Further review of the patient's records showed he underwent a stress test in July which showed a preserved EF and no evidence of ischemia.  It was felt the patient would require cardiac catheterization.  The patient was agreeable to proceed.  This was performed on 10/16/2020 and showed multivessel CAD.  It was felt coronary bypass grafting would be indicated and TCTS consult was obtained.  The patient was evaluated by Dr. Cliffton Asters who was in agreement the patient would benefit from coronary bypass.  He would require a few days to allow Eliquis to leave his system prior to proceeding.  The risks and benefits of the procedure were explained to the patient and he was agreeable to proceed.  He was taken to the operating room on 10/18/2020.  He underwent CABG x 4 utilizing LIMA to LAD, RSVG to Diagonal, RSVG to OM 1, and RSVG to OM 3. Clipping of Left Atrial Appendage with an Atricure Size 40 Clip.  He also underwent  endoscopic saphenous vein harvest from his right leg.  He tolerated the procedure without difficulty and was taken to the SICU in stable condition.  He was extubated the evening of surgery.  During his stay in the SICU he was started on Lidocaine patches for additional pain relief.  The patient's chest tubes and arterial lines were removed without difficulty.  He developed Atrial Fibrillation and was stared on Amiodarone Drip per protocol.  This improved his heart rate and he was converted to oral regimen.  He was re-started on his home regimen of Eliquis for stroke prophylaxis.  The patient was started on diuretics for volume overload state.  The patient developed a mildly elevated leukocytosis which has improved without intervention.  The patient was medically stable for transfer to the progressive care unit in stable condition.  The patients CXR shows a moderate left sided pleural effusion.  IR thoracentesis was requested.  This was performed and 750 ml of fluid was removed.  He developed rapid Atrial Fibrillation.  He was treated with IV Lopressor initially and later started on amiodarone.   His Lopressor dose was titrated accordingly.  He was weaned off oxygen as tolerated.  His surgical incisions are healing without evidence of infection.  There was some minor bruising in his right thigh which isn't unexpected after saphenous vein harvest.  He was transitioned back to Coreg which he took prior to surgery.  His dose was increased to 25 mg BID.  Unfortunately he  developed hypotension.  This was thought to be related to volume depletion from diuretics and administration of IV Lopressor and Coreg.  His Coreg dose was held and decreased to his pre operative dose at 12.5 mg BID.  He was also treated with Albumin x 2.  Due to persistence Atrial Fibrillation Cardiology assistance was requested.  They felt cardioversion would be indicated.  The patient was agreeable to proceed and this was performed on 10/26/2020.

## 2020-10-18 NOTE — Transfer of Care (Addendum)
Immediate Anesthesia Transfer of Care Note  Patient: Herbert Torres  Procedure(s) Performed: CORONARY ARTERY BYPASS GRAFTING (CABG) TIMES FOUR USING LEFT INTERNAL MAMMARY ARTERY AND RIGHT LEG GREATER SAPHENOUS VEIN HARVESTED ENDOSCOPICALLY Flowtrack only (N/A Chest) TRANSESOPHAGEAL ECHOCARDIOGRAM (TEE) (N/A Chest) CLIPPING OF LEFT ATRIAL APPENDAGE USING ATRICURE ATRICLIP SIZE 40 (Left Chest)  Patient Location: ICU  Anesthesia Type:General  Level of Consciousness: Patient remains intubated per anesthesia plan  Airway & Oxygen Therapy: Patient placed on Ventilator (see vital sign flow sheet for setting)  Post-op Assessment: Report given to RN and Post -op Vital signs reviewed and stable  Post vital signs: Reviewed and stable  Last Vitals:  Vitals Value Taken Time  BP 101/47 (65)   Temp    Pulse 63 10/18/20 1335  Resp 20 10/18/20 1335  SpO2 96 % 10/18/20 1335  Vitals shown include unvalidated device data.  Last Pain:  Vitals:   10/18/20 0435  TempSrc: Oral  PainSc:          Complications: No complications documented.

## 2020-10-18 NOTE — Brief Op Note (Signed)
10/15/2020 - 10/18/2020  12:03 PM  PATIENT:  Herbert Torres  74 y.o. male  PRE-OPERATIVE DIAGNOSIS:  Coronary artery disease  POST-OPERATIVE DIAGNOSIS:  Coronary artery disease  PROCEDURE:  Procedure(s):  CORONARY ARTERY BYPASS GRAFTING x 4 -LIMA to LAD -RSVG to DIAGONAL -RSVG to OM 1 -RSVG to OM 3  ENDOSCOPIC HARVEST GREATER SAPHENOUS VEIN -Right Leg ( 44 harvest/10 prep)  CLIPPING OF LEFT ATRIAL APPENDAGE USING ATRICURE ATRICLIP SIZE 40 (Left)  TRANSESOPHAGEAL ECHOCARDIOGRAM (TEE) (N/A)  SURGEON:  Surgeon(s) and Role:    * Lightfoot, Eliezer Lofts, MD - Primary  PHYSICIAN ASSISTANT: Lowella Dandy PA-C  ANESTHESIA:   general  BLOOD ADMINISTERED: CELLSAVER  DRAINS:  Left Pleural Chest Tube, Mediastinal Chest Drain    LOCAL MEDICATIONS USED:  NONE  SPECIMEN:  No Specimen  DISPOSITION OF SPECIMEN:  N/A  COUNTS:  YES  TOURNIQUET:  * No tourniquets in log *  DICTATION: .Dragon Dictation  PLAN OF CARE: Admit to inpatient   PATIENT DISPOSITION:  ICU - intubated and hemodynamically stable.   Delay start of Pharmacological VTE agent (>24hrs) due to surgical blood loss or risk of bleeding: yes

## 2020-10-18 NOTE — Anesthesia Procedure Notes (Signed)
Arterial Line Insertion Start/End10/27/2021 6:40 AM, 10/18/2020 7:00 AM Performed by: Lelon Perla, CRNA, CRNA  Patient location: Pre-op. Preanesthetic checklist: patient identified, IV checked, site marked, risks and benefits discussed, surgical consent, monitors and equipment checked, pre-op evaluation, timeout performed and anesthesia consent Lidocaine 1% used for infiltration Left, radial was placed Catheter size: 20 Fr Hand hygiene performed  and maximum sterile barriers used   Attempts: 1 Procedure performed without using ultrasound guided technique. Following insertion, dressing applied and Biopatch. Post procedure assessment: normal and unchanged  Patient tolerated the procedure well with no immediate complications.

## 2020-10-18 NOTE — Progress Notes (Signed)
Patient's NIF -20, VC 700. Patient weaned perfectly otherwise. Dr. Laneta Simmers made aware. Orders received to extubate.

## 2020-10-18 NOTE — Progress Notes (Signed)
Patient c/o chest discomfort , stated pain 8/10 , slightly diaphoretic. HR fluctuating fr  103-124,Afib, b/p 155/71. Dr Julianne Handler notified.with orders., Metoprolol 25 mg given, NTG x1   given l for chext pain ,with some relief 3/10 pain level now  per patient.EKG also  relayed to above MD. Continue to monitor patient.closely.

## 2020-10-18 NOTE — Anesthesia Postprocedure Evaluation (Signed)
Anesthesia Post Note  Patient: Herbert Torres  Procedure(s) Performed: CORONARY ARTERY BYPASS GRAFTING (CABG) TIMES FOUR USING LEFT INTERNAL MAMMARY ARTERY AND RIGHT LEG GREATER SAPHENOUS VEIN HARVESTED ENDOSCOPICALLY Flowtrack only (N/A Chest) TRANSESOPHAGEAL ECHOCARDIOGRAM (TEE) (N/A Chest) CLIPPING OF LEFT ATRIAL APPENDAGE USING ATRICURE ATRICLIP SIZE 40 (Left Chest)     Patient location during evaluation: SICU Anesthesia Type: General Level of consciousness: sedated and patient remains intubated per anesthesia plan Pain management: pain level controlled Vital Signs Assessment: post-procedure vital signs reviewed and stable Respiratory status: patient remains intubated per anesthesia plan and patient on ventilator - see flowsheet for VS Cardiovascular status: stable Anesthetic complications: no   No complications documented.  Last Vitals:  Vitals:   10/18/20 1730 10/18/20 1745  BP:    Pulse: 75 89  Resp: (!) 21 18  Temp:    SpO2: 96% 91%    Last Pain:  Vitals:   10/18/20 1330  TempSrc: Bladder  PainSc:                  Herbert Torres

## 2020-10-18 NOTE — Discharge Instructions (Signed)

## 2020-10-18 NOTE — Anesthesia Procedure Notes (Signed)
Procedure Name: Intubation Date/Time: 10/18/2020 7:57 AM Performed by: Dorthea Cove, CRNA Pre-anesthesia Checklist: Patient identified, Emergency Drugs available, Suction available and Patient being monitored Patient Re-evaluated:Patient Re-evaluated prior to induction Oxygen Delivery Method: Circle System Utilized Preoxygenation: Pre-oxygenation with 100% oxygen Induction Type: IV induction Ventilation: Mask ventilation without difficulty Laryngoscope Size: Mac and 4 Grade View: Grade I Tube type: Oral Tube size: 8.0 mm Number of attempts: 1 Airway Equipment and Method: Stylet and Oral airway Placement Confirmation: ETT inserted through vocal cords under direct vision,  positive ETCO2 and breath sounds checked- equal and bilateral Secured at: 22 cm Tube secured with: Tape Dental Injury: Teeth and Oropharynx as per pre-operative assessment

## 2020-10-18 NOTE — Progress Notes (Signed)
     [address]       [place] [address]             [phone]       No events overnight  Vitals:   10/18/20 0009 10/18/20 0435  BP: (!) 155/71 111/79  Pulse:  97  Resp:  19  Temp:  97.6 F (36.4 C)  SpO2:  95%   Alert NAD Sinus EWOB  OR today for CABG 4-5  Britton Perkinson O Jaylin Benzel

## 2020-10-19 ENCOUNTER — Encounter (HOSPITAL_COMMUNITY): Payer: Self-pay | Admitting: Thoracic Surgery (Cardiothoracic Vascular Surgery)

## 2020-10-19 ENCOUNTER — Inpatient Hospital Stay (HOSPITAL_COMMUNITY): Payer: Medicare Other

## 2020-10-19 LAB — CBC
HCT: 35.2 % — ABNORMAL LOW (ref 39.0–52.0)
HCT: 35.1 % — ABNORMAL LOW (ref 39.0–52.0)
Hemoglobin: 11.4 g/dL — ABNORMAL LOW (ref 13.0–17.0)
Hemoglobin: 11.3 g/dL — ABNORMAL LOW (ref 13.0–17.0)
MCH: 30.4 pg (ref 26.0–34.0)
MCH: 30.5 pg (ref 26.0–34.0)
MCHC: 32.4 g/dL (ref 30.0–36.0)
MCHC: 32.2 g/dL (ref 30.0–36.0)
MCV: 93.9 fL (ref 80.0–100.0)
MCV: 94.9 fL (ref 80.0–100.0)
Platelets: 271 10*3/uL (ref 150–400)
Platelets: 253 10*3/uL (ref 150–400)
RBC: 3.75 MIL/uL — ABNORMAL LOW (ref 4.22–5.81)
RBC: 3.7 MIL/uL — ABNORMAL LOW (ref 4.22–5.81)
RDW: 12.9 % (ref 11.5–15.5)
RDW: 13.2 % (ref 11.5–15.5)
WBC: 17.8 10*3/uL — ABNORMAL HIGH (ref 4.0–10.5)
WBC: 21.1 10*3/uL — ABNORMAL HIGH (ref 4.0–10.5)
nRBC: 0 % (ref 0.0–0.2)
nRBC: 0 % (ref 0.0–0.2)

## 2020-10-19 LAB — BASIC METABOLIC PANEL WITH GFR
Anion gap: 10 (ref 5–15)
BUN: 18 mg/dL (ref 8–23)
CO2: 24 mmol/L (ref 22–32)
Calcium: 8.1 mg/dL — ABNORMAL LOW (ref 8.9–10.3)
Chloride: 100 mmol/L (ref 98–111)
Creatinine, Ser: 1.63 mg/dL — ABNORMAL HIGH (ref 0.61–1.24)
GFR, Estimated: 44 mL/min — ABNORMAL LOW
Glucose, Bld: 274 mg/dL — ABNORMAL HIGH (ref 70–99)
Potassium: 4.8 mmol/L (ref 3.5–5.1)
Sodium: 134 mmol/L — ABNORMAL LOW (ref 135–145)

## 2020-10-19 LAB — POCT I-STAT 7, (LYTES, BLD GAS, ICA,H+H)
Acid-base deficit: 5 mmol/L — ABNORMAL HIGH (ref 0.0–2.0)
Bicarbonate: 20.5 mmol/L (ref 20.0–28.0)
Calcium, Ion: 1.13 mmol/L — ABNORMAL LOW (ref 1.15–1.40)
HCT: 33 % — ABNORMAL LOW (ref 39.0–52.0)
Hemoglobin: 11.2 g/dL — ABNORMAL LOW (ref 13.0–17.0)
O2 Saturation: 96 %
Patient temperature: 37.83
Potassium: 4.1 mmol/L (ref 3.5–5.1)
Sodium: 140 mmol/L (ref 135–145)
TCO2: 22 mmol/L (ref 22–32)
pCO2 arterial: 38.9 mmHg (ref 32.0–48.0)
pH, Arterial: 7.334 — ABNORMAL LOW (ref 7.350–7.450)
pO2, Arterial: 87 mmHg (ref 83.0–108.0)

## 2020-10-19 LAB — GLUCOSE, CAPILLARY
Glucose-Capillary: 136 mg/dL — ABNORMAL HIGH (ref 70–99)
Glucose-Capillary: 136 mg/dL — ABNORMAL HIGH (ref 70–99)
Glucose-Capillary: 138 mg/dL — ABNORMAL HIGH (ref 70–99)
Glucose-Capillary: 139 mg/dL — ABNORMAL HIGH (ref 70–99)
Glucose-Capillary: 139 mg/dL — ABNORMAL HIGH (ref 70–99)
Glucose-Capillary: 140 mg/dL — ABNORMAL HIGH (ref 70–99)
Glucose-Capillary: 168 mg/dL — ABNORMAL HIGH (ref 70–99)
Glucose-Capillary: 171 mg/dL — ABNORMAL HIGH (ref 70–99)
Glucose-Capillary: 192 mg/dL — ABNORMAL HIGH (ref 70–99)
Glucose-Capillary: 199 mg/dL — ABNORMAL HIGH (ref 70–99)
Glucose-Capillary: 251 mg/dL — ABNORMAL HIGH (ref 70–99)

## 2020-10-19 LAB — BASIC METABOLIC PANEL
Anion gap: 9 (ref 5–15)
BUN: 12 mg/dL (ref 8–23)
CO2: 22 mmol/L (ref 22–32)
Calcium: 8.1 mg/dL — ABNORMAL LOW (ref 8.9–10.3)
Chloride: 106 mmol/L (ref 98–111)
Creatinine, Ser: 1.14 mg/dL (ref 0.61–1.24)
GFR, Estimated: >60 mL/min (ref >60)
Glucose, Bld: 142 mg/dL — ABNORMAL HIGH (ref 70–99)
Potassium: 4.3 mmol/L (ref 3.5–5.1)
Sodium: 137 mmol/L (ref 135–145)

## 2020-10-19 LAB — MAGNESIUM
Magnesium: 2 mg/dL (ref 1.7–2.4)
Magnesium: 1.9 mg/dL (ref 1.7–2.4)

## 2020-10-19 MED ORDER — APIXABAN 5 MG PO TABS
5.0000 mg | ORAL_TABLET | Freq: Two times a day (BID) | ORAL | Status: DC
  Administered 2020-10-19 – 2020-10-29 (×21): 5 mg via ORAL
  Filled 2020-10-19 (×21): qty 1

## 2020-10-19 MED ORDER — LIDOCAINE 5 % EX PTCH
2.0000 | MEDICATED_PATCH | CUTANEOUS | Status: DC
  Administered 2020-10-19: 2 via TRANSDERMAL
  Filled 2020-10-19 (×7): qty 2

## 2020-10-19 MED ORDER — ORAL CARE MOUTH RINSE
15.0000 mL | Freq: Two times a day (BID) | OROMUCOSAL | Status: DC
  Administered 2020-10-19 – 2020-10-20 (×4): 15 mL via OROMUCOSAL

## 2020-10-19 MED ORDER — AMIODARONE HCL IN DEXTROSE 360-4.14 MG/200ML-% IV SOLN
INTRAVENOUS | Status: AC
  Administered 2020-10-19: 150 mg via INTRAVENOUS
  Filled 2020-10-19: qty 200

## 2020-10-19 MED ORDER — FUROSEMIDE 10 MG/ML IJ SOLN
40.0000 mg | Freq: Once | INTRAMUSCULAR | Status: AC
  Administered 2020-10-19: 40 mg via INTRAVENOUS
  Filled 2020-10-19: qty 4

## 2020-10-19 MED ORDER — AMIODARONE LOAD VIA INFUSION
150.0000 mg | Freq: Once | INTRAVENOUS | Status: AC
  Filled 2020-10-19: qty 83.34

## 2020-10-19 MED ORDER — ASPIRIN EC 81 MG PO TBEC
81.0000 mg | DELAYED_RELEASE_TABLET | Freq: Every day | ORAL | Status: DC
  Administered 2020-10-19 – 2020-10-29 (×11): 81 mg via ORAL
  Filled 2020-10-19 (×11): qty 1

## 2020-10-19 MED ORDER — INSULIN ASPART 100 UNIT/ML ~~LOC~~ SOLN: 0.0000 [IU] | SUBCUTANEOUS | Status: DC

## 2020-10-19 MED ORDER — INSULIN ASPART 100 UNIT/ML ~~LOC~~ SOLN
0.0000 [IU] | SUBCUTANEOUS | Status: DC
  Administered 2020-10-19 (×2): 4 [IU] via SUBCUTANEOUS
  Administered 2020-10-19: 2 [IU] via SUBCUTANEOUS
  Administered 2020-10-19: 12 [IU] via SUBCUTANEOUS
  Administered 2020-10-20: 8 [IU] via SUBCUTANEOUS
  Administered 2020-10-20: 2 [IU] via SUBCUTANEOUS
  Administered 2020-10-20: 4 [IU] via SUBCUTANEOUS
  Administered 2020-10-20: 2 [IU] via SUBCUTANEOUS
  Administered 2020-10-20: 8 [IU] via SUBCUTANEOUS

## 2020-10-19 MED ORDER — AMIODARONE HCL IN DEXTROSE 360-4.14 MG/200ML-% IV SOLN
30.0000 mg/h | INTRAVENOUS | Status: DC
  Administered 2020-10-19 – 2020-10-20 (×2): 30 mg/h via INTRAVENOUS
  Filled 2020-10-19 (×2): qty 200

## 2020-10-19 MED ORDER — METOPROLOL TARTRATE 25 MG PO TABS
25.0000 mg | ORAL_TABLET | Freq: Two times a day (BID) | ORAL | Status: DC
  Administered 2020-10-19 – 2020-10-22 (×6): 25 mg via ORAL
  Filled 2020-10-19 (×6): qty 1

## 2020-10-19 MED ORDER — AMIODARONE HCL IN DEXTROSE 360-4.14 MG/200ML-% IV SOLN
60.0000 mg/h | INTRAVENOUS | Status: DC
  Administered 2020-10-19 (×2): 60 mg/h via INTRAVENOUS

## 2020-10-19 MED ORDER — ENOXAPARIN SODIUM 30 MG/0.3ML ~~LOC~~ SOLN: 30.0000 mg | Freq: Every day | SUBCUTANEOUS | Status: DC

## 2020-10-19 MED FILL — Lidocaine HCl Local Preservative Free (PF) Inj 2%: INTRAMUSCULAR | Qty: 15 | Status: AC

## 2020-10-19 MED FILL — Potassium Chloride Inj 2 mEq/ML: INTRAVENOUS | Qty: 40 | Status: AC

## 2020-10-19 MED FILL — Heparin Sodium (Porcine) Inj 1000 Unit/ML: INTRAMUSCULAR | Qty: 30 | Status: AC

## 2020-10-19 NOTE — Progress Notes (Signed)
      [address]       [place] [address]             [phone]                 1 Day Post-Op Procedure(s) (LRB): CORONARY ARTERY BYPASS GRAFTING (CABG) TIMES FOUR USING LEFT INTERNAL MAMMARY ARTERY AND RIGHT LEG GREATER SAPHENOUS VEIN HARVESTED ENDOSCOPICALLY Flowtrack only (N/A) TRANSESOPHAGEAL ECHOCARDIOGRAM (TEE) (N/A) CLIPPING OF LEFT ATRIAL APPENDAGE USING ATRICURE ATRICLIP SIZE 40 (Left)   Events: Complains of incisional pain _______________________________________________________________ Vitals: BP 117/70   Pulse 93   Temp (!) 100.4 F (38 C) (Bladder)   Resp 17   Ht 6\' 1"  (1.854 m)   Wt 111.4 kg   SpO2 94%   BMI 32.40 kg/m   - Neuro: alert NAD  - Cardiovascular: sinus  Drips: none.   CVP:  [3 mmHg-12 mmHg] 7 mmHg  - Pulm: EWOB  ABG    Component Value Date/Time   PHART 7.334 (L) 10/19/2020 0032   PCO2ART 38.9 10/19/2020 0032   PO2ART 87 10/19/2020 0032   HCO3 20.5 10/19/2020 0032   TCO2 22 10/19/2020 0032   ACIDBASEDEF 5.0 (H) 10/19/2020 0032   O2SAT 96.0 10/19/2020 0032    - Abd: soft - Extremity: warm  .Intake/Output      10/27 0701 - 10/28 0700 10/28 0701 - 10/29 0700   P.O. 490    I.V. (mL/kg) 2543.3 (22.8)    Blood 433    IV Piggyback 942.7    Total Intake(mL/kg) 4409 (39.6)    Urine (mL/kg/hr) 2680 (1)    Chest Tube 390    Total Output 3070    Net +1339            _______________________________________________________________ Labs: CBC Latest Ref Rng & Units 10/19/2020 10/19/2020 10/18/2020  WBC 4.0 - 10.5 K/uL 17.8(H) - -  Hemoglobin 13.0 - 17.0 g/dL 11.4(L) 11.2(L) 12.6(L)  Hematocrit 39 - 52 % 35.2(L) 33.0(L) 37.0(L)  Platelets 150 - 400 K/uL 271 - -   CMP Latest Ref Rng & Units 10/19/2020 10/19/2020 10/18/2020  Glucose 70 - 99 mg/dL 10/20/2020) - -  BUN 8 - 23 mg/dL 12 - -  Creatinine 818(E - 1.24 mg/dL 9.93 - -  Sodium 7.16 - 145 mmol/L 137 140 140  Potassium 3.5 - 5.1 mmol/L 4.3 4.1 4.9  Chloride 98 -  111 mmol/L 106 - -  CO2 22 - 32 mmol/L 22 - -  Calcium 8.9 - 10.3 mg/dL 8.1(L) - -  Total Protein 6.5 - 8.1 g/dL - - -  Total Bilirubin 0.3 - 1.2 mg/dL - - -  Alkaline Phos 38 - 126 U/L - - -  AST 15 - 41 U/L - - -  ALT 0 - 44 U/L - - -    CXR: clear  _______________________________________________________________  Assessment and Plan: POD 1 s/p CABG Atriclip  Neuro: adding lidocaine patches CV: A/S/BB.  Will start eliquis Pulm: pulm toilet Renal: stable GI: advancing diet Heme: stable ID: [email or web address] Endo: SSI Dispo: possible floor today after ambulation  967, MD 10/19/2020 7:56 AM

## 2020-10-19 NOTE — Progress Notes (Signed)
Cardiac Monitoring Event  Dysrhythmia: A Fib RVR  Symptoms: asymptomatic  Level of Consciousness: A x O x 4  Last set of vital signs taken:  Temp: 98.4 F (36.9 C)  Pulse Rate: (!) 102  Resp: 13  BP: 114/69  SpO2: 92 %  Name of MD Notified:  Lightfoot MD  Time MD Notified:  1605  Comments/Actions Taken:   Received orders for Amio Protocol. Strip saved.

## 2020-10-19 NOTE — Addendum Note (Signed)
Addendum  created 10/19/20 0867 by Adair Laundry, CRNA   Order list changed

## 2020-10-20 ENCOUNTER — Inpatient Hospital Stay (HOSPITAL_COMMUNITY): Payer: Medicare Other

## 2020-10-20 DIAGNOSIS — I480 Paroxysmal atrial fibrillation: Secondary | ICD-10-CM

## 2020-10-20 DIAGNOSIS — Z951 Presence of aortocoronary bypass graft: Secondary | ICD-10-CM

## 2020-10-20 LAB — GLUCOSE, CAPILLARY
Glucose-Capillary: 139 mg/dL — ABNORMAL HIGH (ref 70–99)
Glucose-Capillary: 160 mg/dL — ABNORMAL HIGH (ref 70–99)
Glucose-Capillary: 190 mg/dL — ABNORMAL HIGH (ref 70–99)
Glucose-Capillary: 220 mg/dL — ABNORMAL HIGH (ref 70–99)
Glucose-Capillary: 224 mg/dL — ABNORMAL HIGH (ref 70–99)
Glucose-Capillary: 235 mg/dL — ABNORMAL HIGH (ref 70–99)

## 2020-10-20 LAB — BASIC METABOLIC PANEL
Anion gap: 9 (ref 5–15)
BUN: 26 mg/dL — ABNORMAL HIGH (ref 8–23)
CO2: 23 mmol/L (ref 22–32)
Calcium: 8.3 mg/dL — ABNORMAL LOW (ref 8.9–10.3)
Chloride: 100 mmol/L (ref 98–111)
Creatinine, Ser: 1.47 mg/dL — ABNORMAL HIGH (ref 0.61–1.24)
GFR, Estimated: 49 mL/min — ABNORMAL LOW (ref >60)
Glucose, Bld: 218 mg/dL — ABNORMAL HIGH (ref 70–99)
Potassium: 4.7 mmol/L (ref 3.5–5.1)
Sodium: 132 mmol/L — ABNORMAL LOW (ref 135–145)

## 2020-10-20 LAB — CBC
HCT: 34 % — ABNORMAL LOW (ref 39.0–52.0)
Hemoglobin: 10.9 g/dL — ABNORMAL LOW (ref 13.0–17.0)
MCH: 30.5 pg (ref 26.0–34.0)
MCHC: 32.1 g/dL (ref 30.0–36.0)
MCV: 95.2 fL (ref 80.0–100.0)
Platelets: 256 10*3/uL (ref 150–400)
RBC: 3.57 MIL/uL — ABNORMAL LOW (ref 4.22–5.81)
RDW: 13.1 % (ref 11.5–15.5)
WBC: 20.8 10*3/uL — ABNORMAL HIGH (ref 4.0–10.5)
nRBC: 0 % (ref 0.0–0.2)

## 2020-10-20 MED ORDER — INSULIN DETEMIR 100 UNIT/ML ~~LOC~~ SOLN
10.0000 [IU] | Freq: Two times a day (BID) | SUBCUTANEOUS | Status: DC
  Administered 2020-10-20 – 2020-10-23 (×7): 10 [IU] via SUBCUTANEOUS
  Filled 2020-10-20 (×11): qty 0.1

## 2020-10-20 MED ORDER — INSULIN ASPART 100 UNIT/ML ~~LOC~~ SOLN
0.0000 [IU] | Freq: Three times a day (TID) | SUBCUTANEOUS | Status: DC
  Administered 2020-10-21 (×3): 4 [IU] via SUBCUTANEOUS
  Administered 2020-10-22: 2 [IU] via SUBCUTANEOUS
  Administered 2020-10-22: 4 [IU] via SUBCUTANEOUS
  Administered 2020-10-22: 2 [IU] via SUBCUTANEOUS
  Administered 2020-10-23: 8 [IU] via SUBCUTANEOUS
  Administered 2020-10-23 – 2020-10-24 (×4): 2 [IU] via SUBCUTANEOUS
  Administered 2020-10-24 – 2020-10-25 (×2): 4 [IU] via SUBCUTANEOUS
  Administered 2020-10-25: 2 [IU] via SUBCUTANEOUS
  Administered 2020-10-25: 4 [IU] via SUBCUTANEOUS
  Administered 2020-10-27: 2 [IU] via SUBCUTANEOUS
  Administered 2020-10-27 (×2): 4 [IU] via SUBCUTANEOUS
  Administered 2020-10-28: 2 [IU] via SUBCUTANEOUS
  Administered 2020-10-28: 4 [IU] via SUBCUTANEOUS
  Administered 2020-10-28: 8 [IU] via SUBCUTANEOUS
  Administered 2020-10-29: 2 [IU] via SUBCUTANEOUS

## 2020-10-20 MED ORDER — SODIUM CHLORIDE 0.9 % IV SOLN: 250.0000 mL | INTRAVENOUS | Status: DC | PRN

## 2020-10-20 MED ORDER — SODIUM CHLORIDE 0.9% FLUSH
3.0000 mL | Freq: Two times a day (BID) | INTRAVENOUS | Status: DC
  Administered 2020-10-20 (×2): 3 mL via INTRAVENOUS

## 2020-10-20 MED ORDER — POTASSIUM CHLORIDE CRYS ER 20 MEQ PO TBCR
40.0000 meq | EXTENDED_RELEASE_TABLET | Freq: Once | ORAL | Status: AC
  Administered 2020-10-20: 40 meq via ORAL
  Filled 2020-10-20: qty 2

## 2020-10-20 MED ORDER — SODIUM CHLORIDE 0.9% FLUSH: 3.0000 mL | INTRAVENOUS | Status: DC | PRN

## 2020-10-20 MED ORDER — AMIODARONE HCL 200 MG PO TABS
200.0000 mg | ORAL_TABLET | Freq: Two times a day (BID) | ORAL | Status: DC
  Administered 2020-10-20 – 2020-10-22 (×6): 200 mg via ORAL
  Filled 2020-10-20 (×6): qty 1

## 2020-10-20 MED ORDER — FUROSEMIDE 40 MG PO TABS
40.0000 mg | ORAL_TABLET | Freq: Two times a day (BID) | ORAL | Status: DC
  Administered 2020-10-20 – 2020-10-24 (×10): 40 mg via ORAL
  Filled 2020-10-20 (×10): qty 1

## 2020-10-20 MED ORDER — ~~LOC~~ CARDIAC SURGERY, PATIENT & FAMILY EDUCATION
Freq: Once | Status: AC
  Administered 2020-10-20: 1

## 2020-10-20 NOTE — Progress Notes (Signed)
Patient is doing well post CABG.  Is in AFib rate controlled. On IV amiodarone. Plan to transition to po today.  Eliquis resumed.   On Crestor and Zetia.   Consider resuming ACEi once renal function at baseline.  Terrian Ridlon [place] MD, [facility]

## 2020-10-20 NOTE — Progress Notes (Signed)
Anesthesiology Follow-up:  74 year old male admitted with NSTEMI found to have diffuse 3V CAD. Now 2 days S/P CABG X 4 and atriclip placement.  Sitting in chair, mild incisional pain, appears neurologically intact but communication somewhat limited by hearing difficulties.  VS: T- 36.6 BP- 117/69 HR- 81 RR-12 O2 sat 94% on RA  glucose- 218 BUN/Cr.- 26/1.47 K-4.7 H/H- 10.9/34 Platelets-256,000  Extubated 2 hours post-op. Creatinine increased from 1.14 pre-op to 1.63 yesterday, now 1.47. otherwise doing well  Kipp Brood

## 2020-10-20 NOTE — Progress Notes (Signed)
° °   °  [address]       [place] [address]             [phone]                 2 Days Post-Op Procedure(s) (LRB): CORONARY ARTERY BYPASS GRAFTING (CABG) TIMES FOUR USING LEFT INTERNAL MAMMARY ARTERY AND RIGHT LEG GREATER SAPHENOUS VEIN HARVESTED ENDOSCOPICALLY Flowtrack only (N/A) TRANSESOPHAGEAL ECHOCARDIOGRAM (TEE) (N/A) CLIPPING OF LEFT ATRIAL APPENDAGE USING ATRICURE ATRICLIP SIZE 40 (Left)   Events: Pain improved today, A. fib overnight _______________________________________________________________ Vitals: BP 117/69    Pulse 81    Temp 97.9 F (36.6 C)    Resp 12    Ht 6\' 1"  (1.854 m)    Wt 112.7 kg    SpO2 94%    BMI 32.78 kg/m   - Neuro: alert NAD  - Cardiovascular: Atrial fibrillation, rate controlled  Drips: Amiodarone    - Pulm: EWOB  ABG    Component Value Date/Time   PHART 7.334 (L) 10/19/2020 0032   PCO2ART 38.9 10/19/2020 0032   PO2ART 87 10/19/2020 0032   HCO3 20.5 10/19/2020 0032   TCO2 22 10/19/2020 0032   ACIDBASEDEF 5.0 (H) 10/19/2020 0032   O2SAT 96.0 10/19/2020 0032    - Abd: soft - Extremity: warm  .Intake/Output      10/28 0701 - 10/29 0700 10/29 0701 - 10/30 0700   P.O. 1370    I.V. (mL/kg) 591 (5.2) 16.7 (0.1)   Blood     IV Piggyback 199.9 100   Total Intake(mL/kg) 2160.9 (19.2) 116.7 (1)   Urine (mL/kg/hr) 708 (0.3) 125 (0.3)   Chest Tube 280    Total Output 988 125   Net +1172.9 -8.3           _______________________________________________________________ Labs: CBC Latest Ref Rng & Units 10/20/2020 10/19/2020 10/19/2020  WBC 4.0 - 10.5 K/uL 20.8(H) 21.1(H) 17.8(H)  Hemoglobin 13.0 - 17.0 g/dL 10.9(L) 11.3(L) 11.4(L)  Hematocrit 39 - 52 % 34.0(L) 35.1(L) 35.2(L)  Platelets 150 - 400 K/uL 256 253 271   CMP Latest Ref Rng & Units 10/20/2020 10/19/2020 10/19/2020  Glucose 70 - 99 mg/dL 10/21/2020) 914(N) 829(F)  BUN 8 - 23 mg/dL 621(H) 18 12  Creatinine 0.61 - 1.24 mg/dL 08(M) 5.78(I) 6.96(E  Sodium 135  - 145 mmol/L 132(L) 134(L) 137  Potassium 3.5 - 5.1 mmol/L 4.7 4.8 4.3  Chloride 98 - 111 mmol/L 100 100 106  CO2 22 - 32 mmol/L 23 24 22   Calcium 8.9 - 10.3 mg/dL 8.3(L) 8.1(L) 8.1(L)  Total Protein 6.5 - 8.1 g/dL - - -  Total Bilirubin 0.3 - 1.2 mg/dL - - -  Alkaline Phos 38 - 126 U/L - - -  AST 15 - 41 U/L - - -  ALT 0 - 44 U/L - - -    CXR: clear  _______________________________________________________________  Assessment and Plan: POD 2 s/p CABG Atriclip  Neuro: Pain controlled CV: A/S/BB.  On eliquis for history of atrial fibrillation.  Currently on amiodarone drip transitioning to p.o. Pulm: pulm toilet Renal: Creatinine trending down.  We will begin diuresis today. GI: On diet Heme: stable ID: [email or web address] Endo: SSI Dispo: Floor today.  9.52, MD 10/20/2020 10:15 AM

## 2020-10-20 NOTE — Progress Notes (Signed)
CT surgery PM Rounds   O2 sat 88% on hi-flow O2 8L- controlled afib  low urine output - checking bladder scan Q shift  will keep in 2H for pulmonary support- may need BiPaP  Blood pressure 121/69, pulse (!) 115, temperature 97.9 F (36.6 C), resp. rate 11, height 6\' 1"  (1.854 m), weight 112.7 kg, SpO2 92 %.

## 2020-10-20 NOTE — Progress Notes (Signed)
CARDIAC REHAB PHASE I   PRE:  Rate/Rhythm: 98 Afib  BP:  Sitting: 96/78      SaO2: 95 8L  MODE:  Ambulation: 200 ft   POST:  Rate/Rhythm: 106 Afib  BP:  Sitting: 105/70    SaO2: 92 8L  Pt ambulated 223ft in hallway assist of one with EVA and gait belt. Pt denies dizziness, pain, or SOB. Pt does state some leg weakness. Pt returned to recliner. Encouraged continued ambulation and IS use. Will continue to follow.  [id number] Reynold Bowen, RN BSN 10/20/2020 2:01 PM

## 2020-10-21 ENCOUNTER — Inpatient Hospital Stay (HOSPITAL_COMMUNITY): Payer: Medicare Other

## 2020-10-21 LAB — BASIC METABOLIC PANEL
Anion gap: 10 (ref 5–15)
BUN: 31 mg/dL — ABNORMAL HIGH (ref 8–23)
CO2: 24 mmol/L (ref 22–32)
Calcium: 8.1 mg/dL — ABNORMAL LOW (ref 8.9–10.3)
Chloride: 96 mmol/L — ABNORMAL LOW (ref 98–111)
Creatinine, Ser: 1.25 mg/dL — ABNORMAL HIGH (ref 0.61–1.24)
GFR, Estimated: >60 mL/min (ref >60)
Glucose, Bld: 194 mg/dL — ABNORMAL HIGH (ref 70–99)
Potassium: 5.2 mmol/L — ABNORMAL HIGH (ref 3.5–5.1)
Sodium: 130 mmol/L — ABNORMAL LOW (ref 135–145)

## 2020-10-21 LAB — TYPE AND SCREEN
ABO/RH(D): A POS
Antibody Screen: NEGATIVE
Unit division: 0
Unit division: 0

## 2020-10-21 LAB — CBC
HCT: 37.8 % — ABNORMAL LOW (ref 39.0–52.0)
Hemoglobin: 12.7 g/dL — ABNORMAL LOW (ref 13.0–17.0)
MCH: 31 pg (ref 26.0–34.0)
MCHC: 33.6 g/dL (ref 30.0–36.0)
MCV: 92.2 fL (ref 80.0–100.0)
Platelets: 298 10*3/uL (ref 150–400)
RBC: 4.1 MIL/uL — ABNORMAL LOW (ref 4.22–5.81)
RDW: 13.2 % (ref 11.5–15.5)
WBC: 20.9 10*3/uL — ABNORMAL HIGH (ref 4.0–10.5)
nRBC: 0 % (ref 0.0–0.2)

## 2020-10-21 LAB — BPAM RBC
Blood Product Expiration Date: 202111072359
Blood Product Expiration Date: 202111092359
ISSUE DATE / TIME: 202110260358
ISSUE DATE / TIME: 202110260416
Unit Type and Rh: 6200
Unit Type and Rh: 6200

## 2020-10-21 LAB — GLUCOSE, CAPILLARY
Glucose-Capillary: 200 mg/dL — ABNORMAL HIGH (ref 70–99)
Glucose-Capillary: 177 mg/dL — ABNORMAL HIGH (ref 70–99)

## 2020-10-21 MED ORDER — SODIUM ZIRCONIUM CYCLOSILICATE 10 G PO PACK
10.0000 g | PACK | Freq: Once | ORAL | Status: AC
  Administered 2020-10-21: 10 g via ORAL
  Filled 2020-10-21: qty 1

## 2020-10-21 MED ORDER — SORBITOL 70 % SOLN: 30.0000 mL | Freq: Once | Status: DC

## 2020-10-21 MED ORDER — OXYCODONE HCL 5 MG PO TABS: 5.0000 mg | ORAL_TABLET | ORAL | Status: DC | PRN

## 2020-10-21 NOTE — Progress Notes (Signed)
3 Days Post-Op Procedure(s) (LRB): CORONARY ARTERY BYPASS GRAFTING (CABG) TIMES FOUR USING LEFT INTERNAL MAMMARY ARTERY AND RIGHT LEG GREATER SAPHENOUS VEIN HARVESTED ENDOSCOPICALLY Flowtrack only (N/A) TRANSESOPHAGEAL ECHOCARDIOGRAM (TEE) (N/A) CLIPPING OF LEFT ATRIAL APPENDAGE USING ATRICURE ATRICLIP SIZE 40 (Left) Subjective: Progressing after multivessel CABG Postoperative atrial fibrillation rate controlled with p.o. amiodarone on Eliquis Postop nasal cannula oxygen with postop left pleural effusion-continue daily Lasix and mobilization Persistent elevated white count 19-20k without fever with surgical incisions clean and dry slight hematoma right thigh and left pleural effusion-follow and hold antibiotics for now Hyperkalemia with mild elevation in BUN/creatinine-hold potassium and 1 dose of Lokelma  Objective: Vital signs in last 24 hours: Temp:  [97.7 F (36.5 C)-98.1 F (36.7 C)] 97.9 F (36.6 C) (10/30 0424) Pulse Rate:  [71-123] 119 (10/30 1000) Cardiac Rhythm: Atrial fibrillation (10/30 0700) Resp:  [11-32] 23 (10/30 1000) BP: (97-165)/(51-109) 123/73 (10/30 1000) SpO2:  [92 %-96 %] 96 % (10/30 1000) Weight:  [110.2 kg] 110.2 kg (10/30 0500)  Hemodynamic parameters for last 24 hours:  Stable blood pressure atrial fibrillation  Intake/Output from previous day: 10/29 0701 - 10/30 0700 In: 624.8 [P.O.:490; I.V.:34.8; IV Piggyback:100] Out: 1575 [Urine:1505; Chest Tube:70] Intake/Output this shift: Total I/O In: -  Out: 150 [Urine:150]       Exam    General- alert and comfortable.  Sternal incision well-healed.    Neck- no JVD, no cervical adenopathy palpable, no carotid bruit   Lungs- clear without rales, wheezes   Cor- regular rate and rhythm, no murmur , gallop   Abdomen- soft, non-tender   Extremities - warm, non-tender, minimal edema.  Hematoma right thigh   Neuro- oriented, appropriate, no focal weakness   Lab Results: Recent Labs    10/20/20 0300  10/21/20 0232  WBC 20.8* 20.9*  HGB 10.9* 12.7*  HCT 34.0* 37.8*  PLT 256 298   BMET:  Recent Labs    10/20/20 0300 10/21/20 0232  NA 132* 130*  K 4.7 5.2*  CL 100 96*  CO2 23 24  GLUCOSE 218* 194*  BUN 26* 31*  CREATININE 1.47* 1.25*  CALCIUM 8.3* 8.1*    PT/INR:  Recent Labs    10/18/20 1325  LABPROT 15.2  INR 1.3*   ABG    Component Value Date/Time   PHART 7.334 (L) 10/19/2020 0032   HCO3 20.5 10/19/2020 0032   TCO2 22 10/19/2020 0032   ACIDBASEDEF 5.0 (H) 10/19/2020 0032   O2SAT 96.0 10/19/2020 0032   CBG (last 3)  Recent Labs    10/20/20 1532 10/20/20 2002 10/21/20 0620  GLUCAP 160* 224* 200*    Assessment/Plan: S/P Procedure(s) (LRB): CORONARY ARTERY BYPASS GRAFTING (CABG) TIMES FOUR USING LEFT INTERNAL MAMMARY ARTERY AND RIGHT LEG GREATER SAPHENOUS VEIN HARVESTED ENDOSCOPICALLY Flowtrack only (N/A) TRANSESOPHAGEAL ECHOCARDIOGRAM (TEE) (N/A) CLIPPING OF LEFT ATRIAL APPENDAGE USING ATRICURE ATRICLIP SIZE 40 (Left) Mobilize Diuresis Diabetes control Plan for transfer to step-down: see transfer orders Follow-up chest x-ray tomorrow for left pleural effusion  LOS: 6 days    Herbert Torres 10/21/2020

## 2020-10-21 NOTE — Progress Notes (Signed)
1400 Pt has walked twice 300 ft today with staff. Will follow up Monday. Luetta Nutting RN BSN 10/21/2020 2:13 PM

## 2020-10-21 NOTE — Progress Notes (Signed)
Pt admitted to [facility] 20 from 2H.  Pt is A&O x4 and neuro intact.  Pt placed on telemetry and CCMD notified.  CHG bath completed. Vitals taken and all within normal range except BP 94/60 and HR 110.

## 2020-10-22 ENCOUNTER — Inpatient Hospital Stay (HOSPITAL_COMMUNITY): Payer: Medicare Other

## 2020-10-22 LAB — GLUCOSE, CAPILLARY
Glucose-Capillary: 172 mg/dL — ABNORMAL HIGH (ref 70–99)
Glucose-Capillary: 141 mg/dL — ABNORMAL HIGH (ref 70–99)
Glucose-Capillary: 167 mg/dL — ABNORMAL HIGH (ref 70–99)
Glucose-Capillary: 146 mg/dL — ABNORMAL HIGH (ref 70–99)
Glucose-Capillary: 130 mg/dL — ABNORMAL HIGH (ref 70–99)
Glucose-Capillary: 209 mg/dL — ABNORMAL HIGH (ref 70–99)

## 2020-10-22 LAB — BASIC METABOLIC PANEL
Anion gap: 12 (ref 5–15)
BUN: 32 mg/dL — ABNORMAL HIGH (ref 8–23)
CO2: 25 mmol/L (ref 22–32)
Calcium: 8 mg/dL — ABNORMAL LOW (ref 8.9–10.3)
Chloride: 96 mmol/L — ABNORMAL LOW (ref 98–111)
Creatinine, Ser: 1.25 mg/dL — ABNORMAL HIGH (ref 0.61–1.24)
GFR, Estimated: >60 mL/min (ref >60)
Glucose, Bld: 165 mg/dL — ABNORMAL HIGH (ref 70–99)
Potassium: 4.4 mmol/L (ref 3.5–5.1)
Sodium: 133 mmol/L — ABNORMAL LOW (ref 135–145)

## 2020-10-22 LAB — CBC
HCT: 37.7 % — ABNORMAL LOW (ref 39.0–52.0)
Hemoglobin: 12.5 g/dL — ABNORMAL LOW (ref 13.0–17.0)
MCH: 30.2 pg (ref 26.0–34.0)
MCHC: 33.2 g/dL (ref 30.0–36.0)
MCV: 91.1 fL (ref 80.0–100.0)
Platelets: 408 10*3/uL — ABNORMAL HIGH (ref 150–400)
RBC: 4.14 MIL/uL — ABNORMAL LOW (ref 4.22–5.81)
RDW: 13 % (ref 11.5–15.5)
WBC: 15.2 10*3/uL — ABNORMAL HIGH (ref 4.0–10.5)
nRBC: 0 % (ref 0.0–0.2)

## 2020-10-22 MED ORDER — METOLAZONE 5 MG PO TABS
5.0000 mg | ORAL_TABLET | Freq: Once | ORAL | Status: AC
  Administered 2020-10-23: 5 mg via ORAL
  Filled 2020-10-22: qty 1

## 2020-10-22 MED ORDER — METOPROLOL TARTRATE 25 MG PO TABS
37.5000 mg | ORAL_TABLET | Freq: Two times a day (BID) | ORAL | Status: DC
  Administered 2020-10-22: 37.5 mg via ORAL
  Filled 2020-10-22: qty 1

## 2020-10-22 NOTE — Progress Notes (Addendum)
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 USING LEFT INTERNAL MAMMARY ARTERY AND RIGHT LEG GREATER SAPHENOUS VEIN HARVESTED ENDOSCOPICALLY Flowtrack only (N/A) TRANSESOPHAGEAL ECHOCARDIOGRAM (TEE) (N/A) CLIPPING OF LEFT ATRIAL APPENDAGE USING ATRICURE ATRICLIP SIZE 40 (Left) Subjective: Feels ok, no specific c/o  Objective: Vital signs in last 24 hours: Temp:  [96.6 F (35.9 C)-98.1 F (36.7 C)] 98.1 F (36.7 C) (10/31 0809) Pulse Rate:  [51-136] 104 (10/31 0811) Cardiac Rhythm: Atrial fibrillation (10/31 0700) Resp:  [15-32] 22 (10/31 0809) BP: (94-165)/(60-109) 118/68 (10/31 0809) SpO2:  [90 %-98 %] 95 % (10/31 0809) Weight:  [108.6 kg] 108.6 kg (10/31 0500)  Hemodynamic parameters for last 24 hours:    Intake/Output from previous day: 10/30 0701 - 10/31 0700 In: 680 [P.O.:680] Out: 1200 [Urine:1200] Intake/Output this shift: No intake/output data recorded.  General appearance: alert, cooperative, distracted, fatigued and no distress Heart: irregularly irregular rhythm and tachy Lungs: dim left lower fields Abdomen: soft, nontender Extremities: no edema Wound: incis healing well  Lab Results: Recent Labs    10/21/20 0232 10/22/20 0308  WBC 20.9* 15.2*  HGB 12.7* 12.5*  HCT 37.8* 37.7*  PLT 298 408*   BMET:  Recent Labs    10/21/20 0232 10/22/20 0308  NA 130* 133*  K 5.2* 4.4  CL 96* 96*  CO2 24 25  GLUCOSE 194* 165*  BUN 31* 32*  CREATININE 1.25* 1.25*  CALCIUM 8.1* 8.0*    PT/INR: No results for input(s): LABPROT, INR in the last 72 hours. ABG    Component Value Date/Time   PHART 7.334 (L) 10/19/2020 0032   HCO3 20.5 10/19/2020 0032   TCO2 22 10/19/2020 0032   ACIDBASEDEF 5.0 (H) 10/19/2020 0032   O2SAT 96.0 10/19/2020 0032   CBG (last 3)  Recent Labs    10/21/20 1809 10/21/20 2104 10/22/20 0622    GLUCAP 172* 141* 167*    Meds Scheduled Meds: . acetaminophen  1,000 mg Oral Q6H   Or  . acetaminophen (TYLENOL) oral liquid 160 mg/5 mL  1,000 mg Per Tube Q6H  . amiodarone  200 mg Oral BID  . apixaban  5 mg Oral BID  . aspirin EC  81 mg Oral Daily  . bisacodyl  10 mg Oral Daily   Or  . bisacodyl  10 mg Rectal Daily  . Chlorhexidine Gluconate Cloth  6 each Topical Daily  . docusate sodium  200 mg Oral Daily  . ezetimibe  10 mg Oral Daily  . furosemide  40 mg Oral BID  . insulin aspart  0-24 Units Subcutaneous TID AC  . insulin detemir  10 Units Subcutaneous BID  . lidocaine  2 patch Transdermal Q24H  . metoprolol tartrate  25 mg Oral BID  . pantoprazole  40 mg Oral Daily  . rosuvastatin  20 mg Oral Daily  . sorbitol  30 mL Oral Once   Continuous Infusions: PRN Meds:.metoprolol tartrate, ondansetron (ZOFRAN) IV, oxyCODONE, traMADol  Xrays DG Chest Port 1 View  Result Date: 10/22/2020 CLINICAL DATA:  History of CABG. EXAM: PORTABLE CHEST 1 VIEW COMPARISON:  October 21, 2020 FINDINGS: Stable cardiomegaly. Stable left effusion with underlying opacity. No pneumothorax. No other interval changes. No overt edema. IMPRESSION: Stable left effusion with underlying opacity. No pneumothorax. No other  interval changes. Electronically Signed   By: Gerome Sam III M.D   On: 10/22/2020 08:31   DG Chest Port 1 View  Result Date: 10/21/2020 CLINICAL DATA:  Shortness of breath EXAM: PORTABLE CHEST 1 VIEW COMPARISON:  10/20/2020 FINDINGS: Interval removal of lines and tubes. No pneumothorax. Similar left pleural effusion and left basilar atelectasis. Stable cardiomediastinal contours. IMPRESSION: Similar left pleural effusion and left basilar atelectasis. No pneumothorax. Electronically Signed   By: Guadlupe Spanish M.D.   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 USING LEFT INTERNAL MAMMARY ARTERY AND RIGHT LEG  GREATER SAPHENOUS VEIN HARVESTED ENDOSCOPICALLY Flowtrack only (N/A) TRANSESOPHAGEAL ECHOCARDIOGRAM (TEE) (N/A) CLIPPING OF LEFT ATRIAL APPENDAGE USING ATRICURE ATRICLIP SIZE 40 (Left)  POD#4 1 afeb 2 afib with increased rate at times, will increase beta blockier dose 3 variable SBP 94-165, mostly well controlled 4 sats ok on 4 liters, cont pulm toilet, wean as able 5 leukocytosis trend improved 6 creat is stable, good UOP, weight trending down  7 BS good control for hospitalized patient, transition back to metformin at d/c- will need good outpatient management  For Hg A1c 6.7 8 CXR stable left effus with underlying opacity- may need to consider thoracentesis, cont to diurese    LOS: 7 days    Rowe Clack PA-C Pager [phone] 10/22/2020  No complaints Rate controlled atrial fibrillation on Eliquis Still on supplemental oxygen, receiving Lasix, chest x-ray shows persistent left pleural effusion.  We will add metolazone to Lasix and plan PA lateral chest x-ray in a.m. to assess effusion and need for thoracentesis  patient examined and medical record reviewed,agree with above note. Kathlee Nations Trigt III 10/22/2020

## 2020-10-22 NOTE — Progress Notes (Signed)
Mobility Specialist: Progress Note   10/22/20 1446  Mobility  Activity Ambulated in hall  Level of Assistance Modified independent, requires aide device or extra time  Assistive Device Front wheel walker  Distance Ambulated (ft) 380 ft  Mobility Response Tolerated well  Mobility performed by Mobility specialist  Bed Position Semi-fowlers  $Mobility charge 1 Mobility   Pre-Mobility: 117 HR, 103/63 BP, 92% SpO2 Post-Mobility: 130 HR, 154 BP, 99% SpO2  Pt ambulated on 4 L/min [place]. Pt was asx during ambulation.   [facility] Lynnita Somma Mobility Specialist

## 2020-10-23 ENCOUNTER — Inpatient Hospital Stay (HOSPITAL_COMMUNITY): Payer: Medicare Other

## 2020-10-23 HISTORY — PX: IR THORACENTESIS ASP PLEURAL SPACE W/IMG GUIDE: IMG5380

## 2020-10-23 LAB — BASIC METABOLIC PANEL
Anion gap: 11 (ref 5–15)
BUN: 29 mg/dL — ABNORMAL HIGH (ref 8–23)
CO2: 28 mmol/L (ref 22–32)
Calcium: 8 mg/dL — ABNORMAL LOW (ref 8.9–10.3)
Chloride: 96 mmol/L — ABNORMAL LOW (ref 98–111)
Creatinine, Ser: 1.25 mg/dL — ABNORMAL HIGH (ref 0.61–1.24)
GFR, Estimated: >60 mL/min (ref >60)
Glucose, Bld: 151 mg/dL — ABNORMAL HIGH (ref 70–99)
Potassium: 3.5 mmol/L (ref 3.5–5.1)
Sodium: 135 mmol/L (ref 135–145)

## 2020-10-23 LAB — CBC
HCT: 34.9 % — ABNORMAL LOW (ref 39.0–52.0)
Hemoglobin: 11.7 g/dL — ABNORMAL LOW (ref 13.0–17.0)
MCH: 30.5 pg (ref 26.0–34.0)
MCHC: 33.5 g/dL (ref 30.0–36.0)
MCV: 90.9 fL (ref 80.0–100.0)
Platelets: 441 10*3/uL — ABNORMAL HIGH (ref 150–400)
RBC: 3.84 MIL/uL — ABNORMAL LOW (ref 4.22–5.81)
RDW: 13.1 % (ref 11.5–15.5)
WBC: 11.6 10*3/uL — ABNORMAL HIGH (ref 4.0–10.5)
nRBC: 0 % (ref 0.0–0.2)

## 2020-10-23 LAB — GLUCOSE, CAPILLARY
Glucose-Capillary: 156 mg/dL — ABNORMAL HIGH (ref 70–99)
Glucose-Capillary: 214 mg/dL — ABNORMAL HIGH (ref 70–99)
Glucose-Capillary: 138 mg/dL — ABNORMAL HIGH (ref 70–99)
Glucose-Capillary: 233 mg/dL — ABNORMAL HIGH (ref 70–99)

## 2020-10-23 MED ORDER — LIDOCAINE HCL 1 % IJ SOLN
INTRAMUSCULAR | Status: AC
  Filled 2020-10-23: qty 20

## 2020-10-23 MED ORDER — METOPROLOL TARTRATE 50 MG PO TABS
50.0000 mg | ORAL_TABLET | Freq: Two times a day (BID) | ORAL | Status: DC
  Administered 2020-10-23 (×2): 50 mg via ORAL
  Filled 2020-10-23 (×2): qty 1

## 2020-10-23 MED ORDER — POTASSIUM CHLORIDE CRYS ER 20 MEQ PO TBCR: 20.0000 meq | EXTENDED_RELEASE_TABLET | Freq: Every day | ORAL | Status: DC

## 2020-10-23 MED ORDER — AMIODARONE HCL 200 MG PO TABS
400.0000 mg | ORAL_TABLET | Freq: Two times a day (BID) | ORAL | Status: DC
  Administered 2020-10-23 – 2020-10-29 (×13): 400 mg via ORAL
  Filled 2020-10-23 (×13): qty 2

## 2020-10-23 MED ORDER — AMIODARONE IV BOLUS ONLY 150 MG/100ML
150.0000 mg | Freq: Once | INTRAVENOUS | Status: AC
  Administered 2020-10-23: 150 mg via INTRAVENOUS
  Filled 2020-10-23: qty 100

## 2020-10-23 MED ORDER — LIDOCAINE HCL 1 % IJ SOLN
INTRAMUSCULAR | Status: DC | PRN
  Administered 2020-10-23 (×3): 10 mL

## 2020-10-23 MED ORDER — MAGNESIUM OXIDE 400 (241.3 MG) MG PO TABS
400.0000 mg | ORAL_TABLET | Freq: Two times a day (BID) | ORAL | Status: AC
  Administered 2020-10-23 – 2020-10-24 (×4): 400 mg via ORAL
  Filled 2020-10-23 (×4): qty 1

## 2020-10-23 MED ORDER — POTASSIUM CHLORIDE CRYS ER 20 MEQ PO TBCR
40.0000 meq | EXTENDED_RELEASE_TABLET | Freq: Every day | ORAL | Status: DC
  Administered 2020-10-23 – 2020-10-25 (×3): 40 meq via ORAL
  Filled 2020-10-23 (×3): qty 2

## 2020-10-23 NOTE — Procedures (Signed)
PROCEDURE SUMMARY:  Successful US guided left thoracentesis. Yielded 750 of light red fluid. Pt tolerated procedure well. No immediate complications.  CXR ordered; no post-procedure pneumothorax identified  EBL < 2 mL  Mickie Kay, NP 10/23/2020 11:31 AM

## 2020-10-23 NOTE — Progress Notes (Signed)
[id number] Offered to walk with pt. He is not up to it at this time. Stated tired from going to [facility] and thoracentesis. Encouraged IS. Gave jello and fresh water..encouraged to walk with staff later. Luetta Nutting RN BSN 10/23/2020 2:41 PM

## 2020-10-23 NOTE — Care Management Important Message (Signed)
Important Message  Patient Details  Name: Herbert Torres MRN: [id number] Date of Birth: [birth year]   Medicare Important Message Given:  Yes     [name], [name] 10/23/2020, 12:58 PM

## 2020-10-23 NOTE — Progress Notes (Signed)
Mobility Specialist: Progress Note   10/23/20 1747  Mobility  Activity Ambulated in hall  Level of Assistance Contact guard assist, steadying assist  Assistive Device Front wheel walker  Distance Ambulated (ft) 410 ft  Mobility Response Tolerated well  Mobility performed by Mobility specialist  $Mobility charge 1 Mobility   Pre-Mobility: 120 HR, 115/66 BP, 93% SpO2 During Mobility: 111 HR, 96% SpO2 Post-Mobility: 133 HR, 139/74 BP, 98% SpO2  Pt c/o feeling overall fatigued during ambulation. Pt took one standing rest break lasting a few seconds due to feeling SOB and fatigued. Encouraged pt to walk one more time today.   [facility] Emilo Gras Mobility Specialist

## 2020-10-23 NOTE — Progress Notes (Addendum)
      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  Gets short of breath with ambulation per documentation  + ambulation  + BM  Objective: Vital signs in last 24 hours: Temp:  [97.6 F (36.4 C)-98.3 F (36.8 C)] 98.1 F (36.7 C) (11/01 0400) Pulse Rate:  [99-120] 116 (11/01 0400) Cardiac Rhythm: Atrial fibrillation (10/31 2319) Resp:  [16-25] 25 (11/01 0625) BP: (103-139)/(67-88) 133/88 (11/01 0400) SpO2:  [95 %-99 %] 97 % (11/01 0625) Weight:  [104.6 kg] 104.6 kg (11/01 0625)  Intake/Output from previous day: 10/31 0701 - 11/01 0700 In: 240 [P.O.:240] Out: 1775 [Urine:1775]  General appearance: alert, cooperative and no distress Heart: irregularly irregular rhythm Lungs: diminshed left base Abdomen: soft, non-tender; bowel sounds normal; no masses,  no organomegaly Extremities: edema trace Wound: clean and dry  Lab Results: Recent Labs    10/22/20 0308 10/23/20 0131  WBC 15.2* 11.6*  HGB 12.5* 11.7*  HCT 37.7* 34.9*  PLT 408* 441*   BMET:  Recent Labs    10/22/20 0308 10/23/20 0131  NA 133* 135  K 4.4 3.5  CL 96* 96*  CO2 25 28  GLUCOSE 165* 151*  BUN 32* 29*  CREATININE 1.25* 1.25*  CALCIUM 8.0* 8.0*    PT/INR: No results for input(s): LABPROT, INR in the last 72 hours. 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 CV- Atrial Fibrillation, rate is elevated this morning- increase Lopressor to 50 mg BID, Amiodarone 200 mg BID, on Eliquis 2. Pulm- moderate left pleural effusion, will set up for IR thoracentesis, can hopefully wean oxygen once complete 3. Renal- creatinine stable, weight is below baseline, on Lasix.Marland Kitchen K is at 3.5 will supplement 4. DM- sugars controlled, continue current SSIP 5. Dispo- patient with A. Fib, elevated HR this morning, will give IV lopressor, increase Lopressor dose, continue Amiodarone, Eliquis, refer for thoracentesis   LOS: 8 days    Herbert Dandy, PA-C 10/23/2020  Patient remains in A. Fib. Lopressor was required increased as well as amiodarone. Additional IV amiodarone bolus has been given. Patient needs better rate control. Thoracentesis performed with 750 mL removed Continue pulmonary toilet  Herbert Torres

## 2020-10-23 NOTE — Discharge Summary (Signed)
Physician Discharge Summary  Patient ID: Herbert Torres MRN: [id number] DOB/AGE: 03-23-1946 74 y.o.  Admit date: 10/15/2020 Discharge date: 10/29/2020  Admission Diagnoses:  Coronary artery disease Acute NSTEMI Paroxysmal atrial fibrillation   Discharge Diagnoses:   NSTEMI (non-ST elevated myocardial infarction) (HCC) Diabetes mellitus treated with oral medication (HCC) Essential hypertension Postoperative atrial fibrillation with RVR Pure hypercholesterolemia S/P CABG x 4 S/P Clipping of Left Atrial Appendage   Discharged Condition: good  History of Present Illness:  Herbert Torres is a 74 yo male with known history of Paroxysmal Atrial Fibrillation on Eliquis, DM, Hypertension, Hyperlipidemia, and previous tobacco abuse, having quit 30 years ago.  He presented to [facility] with complaints of chest pain.  The pain had been present intermittently for 2-3 weeks prior to presentation.  The patient described the pain as a heaviness with radiation across his chest,  The pain was exacerbated with exertion or bending over.  He denied associated shortness of breath, nausea, and diaphoresis.  Workup in the ED consisted of EKG which showed no ischemic changes other than some mild ST depression in the lateral lead.  Troponin was elevated at 266.  He was ruled in for NSTEMI, started on IV heparin and transferred to [facility] for further care.   Hospital Course:  Upon arrival to [facility] the patient was chest pain free.  He was admitted to the Cardiology service.  Repeat Troponin level was obtained and peaked at 458.  Further review of the patient's records showed he underwent a stress test in July which showed a preserved EF and no evidence of ischemia.  It was felt the patient would require cardiac catheterization.  The patient was agreeable to proceed.  This was performed on 10/16/2020 and showed multivessel CAD.  It was felt coronary bypass grafting would be  indicated and TCTS consult was obtained.  The patient was evaluated by Dr. Cliffton Asters who was in agreement the patient would benefit from coronary bypass.  He would require a few days to allow Eliquis to leave his system prior to proceeding.  The risks and benefits of the procedure were explained to the patient and he was agreeable to proceed.  He was taken to the operating room on 10/18/2020.  He underwent CABG x 4 utilizing LIMA to LAD, RSVG to Diagonal, RSVG to OM 1, and RSVG to OM 3. Clipping of Left Atrial Appendage with an Atricure Size 40 Clip.  He also underwent endoscopic saphenous vein harvest from his right leg.  He tolerated the procedure without difficulty and was taken to the SICU in stable condition.  He was extubated the evening of surgery.  During his stay in the SICU he was started on Lidocaine patches for additional pain relief.  The patient's chest tubes and arterial lines were removed without difficulty.  He developed Atrial Fibrillation and was stared on Amiodarone Drip per protocol.  This improved his heart rate and he was converted to oral regimen.  He was re-started on his home regimen of Eliquis for stroke prophylaxis.  The patient was started on diuretics for volume overload state.  The patient developed a mildly elevated leukocytosis which has improved without intervention.  The patient was medically stable for transfer to the progressive care unit in stable condition.  The patients CXR shows a moderate left sided pleural effusion.  IR thoracentesis was requested.  This was performed and of fluid was removed.  He developed rapid Atrial Fibrillation.  He was treated  with IV Lopressor initially and later started on amiodarone. Oral Eliquis was initiated.   His Lopressor dose was titrated accordingly. The atrial fibrillation persisted and was poorly tolerated due to hypotension and weakness. The cardiology team was consulted. Antiarrhythmic medications and electrolytes were optimized.  DC cardioversion was attempted on 10/28/20 and was initially successful but he quickly reverted back to a-fib.   He was weaned off oxygen as tolerated.  His surgical incisions are healing without evidence of infection.  There was some minor bruising in his right thigh which isn't unexpected after saphenous vein harvest.  The patient remains in rate controlled Atrial Fibrillation.  He is now ambulating without difficulty.  His hypotension has resolved.  He is medically stable for discharge home today.   Consults: cardiology  Significant Diagnostic Studies:    LEFT HEART CATH AND CORONARY ANGIOGRAPHY  Conclusion    Mid RCA to Dist RCA lesion is 100% stenosed.  RV Branch lesion is 90% stenosed.  ---------------------------  Prox LAD to Mid LAD lesion is 80% stenosed with 70% stenosed side branch in 1st Diag & 1st Sept lesion is 70% stenosed.  Mid LAD lesion is 75% stenosed.  ---------------------------  Prox Cx lesion is 75% stenosed.  Lat 3rd Mrg lesion is 80% stenosed.  Diminutive 4th Mrg lesion is 90% stenosed.  Dist Cx / OM lesion is 90% stenosed.  ===================  There is no aortic valve stenosis.  LV end diastolic pressure is normal.  The left ventricular systolic function is normal.  There is trivial (1+) mitral regurgitation.   SUMMARY  Severe multivessel CAD:  ? Possible subacute occlusion or CTO of RCA after RV marginal branch that has an 90% stenosis, with minimal left to right collaterals;  ? Focal eccentric calcified 80% stenosis of the LAD at the takeoff of SP1 (1st Sept) and D1 (1st Diag) along with eccentric 70% mid LAD, and  ? 75% proximal major LCx that has diffuse areas of relatively severe stenosis in the sidebranches.  Relatively normal EF with mild inferior hypokinesis.  Relatively normal LVEDP.   RECOMMENDATIONS  With diabetes and existing A. fib on Eliquis already, best option for this gentleman is CVTS consult for CABG.  Continue  aggressive risk factor modification.  Results reviewed with Dr. [place].  CVTS consult called  Bryan Lemma, MD Bryan Lemma, M.D., M.S. Interventional Cardiologist   ECHOCARDIOGRAM REPORT       Patient Name:  Herbert Torres [facility] Date of Exam: 10/16/2020  Medical Rec #: [id number]    Height:    72.0 in  Accession #:  [id number]   Weight:    235.9 lb  Date of Birth: [birth year]   BSA:     2.285 m  Patient Age:  73 years    BP:      127/103 mmHg  Patient Gender: M        HR:      70 bpm.  Exam Location: Inpatient   Procedure: 2D Echo, Cardiac Doppler, Color Doppler and Intracardiac       Opacification Agent   Indications:  Chest pain    History:    Patient has no prior history of Echocardiogram  examinations.         Acute MI, Arrythmias:Atrial Fibrillation; Risk         Factors:Hypertension and Former Smoker.    Sonographer:  Ross Ludwig RDCS (AE)  Referring Phys: [phone] [facility] [place]     Sonographer Comments: Suboptimal apical window and suboptimal subcostal  window.  IMPRESSIONS    1. Left ventricular ejection fraction, by estimation, is 50 to 55%. The  left ventricle has low normal function. The left ventricle demonstrates  regional wall motion abnormalities (see scoring diagram/findings for  description). Left ventricular diastolic  parameters are consistent with Grade I diastolic dysfunction (impaired  relaxation).  2. Right ventricular systolic function is normal. The right ventricular  size is mildly enlarged.  3. Left atrial size was mild to moderately dilated.  4. The mitral valve is grossly normal. No evidence of mitral valve  regurgitation.  5. The aortic valve was not well visualized. Aortic valve regurgitation  is not visualized.  6. The inferior vena cava is normal in size with <50% respiratory  variability, suggesting right atrial pressure of 8 mmHg.    Comparison(s): No prior Echocardiogram.   FINDINGS  Left Ventricle: Concentric Remodeling LVMI 107 g/m2; RWT 0.71. Left  ventricular ejection fraction, by estimation, is 50 to 55%. The left  ventricle has low normal function. The left ventricle demonstrates  regional wall motion abnormalities. Definity  contrast agent was given IV to delineate the left ventricular endocardial  borders. The left ventricular internal cavity size was normal in size.  There is no left ventricular hypertrophy. Left ventricular diastolic  parameters are consistent with Grade I  diastolic dysfunction (impaired relaxation).     LV Wall Scoring:  The inferior wall is hypokinetic.   Right Ventricle: The right ventricular size is mildly enlarged. No  increase in right ventricular wall thickness. Right ventricular systolic  function is normal.   Left Atrium: Left atrial size was mild to moderately dilated.   Right Atrium: Right atrial size was normal in size.   Pericardium: There is no evidence of pericardial effusion. Presence of  pericardial fat pad.   Mitral Valve: The mitral valve is grossly normal. No evidence of mitral  valve regurgitation.   Tricuspid Valve: The tricuspid valve is not well visualized. Tricuspid  valve regurgitation is not demonstrated.   Aortic Valve: The aortic valve was not well visualized. Aortic valve  regurgitation is not visualized. Aortic valve mean gradient measures 4.3  mmHg. Aortic valve peak gradient measures 7.4 mmHg. Aortic valve area, by  VTI measures 2.47 cm.   Pulmonic Valve: The pulmonic valve was not well visualized. Pulmonic valve  regurgitation is not visualized.   Aorta: The aortic root is normal in size and structure.   Venous: The pulmonary veins were not well visualized. The inferior vena  cava is normal in size with less than 50% respiratory variability,  suggesting right atrial pressure of 8 mmHg.   IAS/Shunts: The atrial septum is grossly  normal.     LEFT VENTRICLE  PLAX 2D  LVIDd:     4.20 cm Diastology  LVIDs:     3.00 cm LV e' medial:  6.31 cm/s  LV PW:     1.50 cm LV E/e' medial: 14.5  LV IVS:    1.50 cm LV e' lateral:  9.36 cm/s  LVOT diam:   2.10 cm LV E/e' lateral: 9.8  LV SV:     70  LV SV Index:  30  LVOT Area:   3.46 cm     RIGHT VENTRICLE       IVC  RV Basal diam: 3.80 cm   IVC diam: 2.00 cm  RV Mid diam:  3.60 cm  RV S prime:   12.30 cm/s  TAPSE (M-mode): 3.4 cm   LEFT ATRIUM  Index    RIGHT ATRIUM      Index  LA diam:    3.50 cm 1.53 cm/m RA Area:   20.90 cm  LA Vol (A2C):  80.8 ml 35.35 ml/m RA Volume:  60.20 ml 26.34 ml/m  LA Vol (A4C):  45.1 ml 19.73 ml/m  LA Biplane Vol: 59.9 ml 26.21 ml/m  AORTIC VALVE  AV Area (Vmax):  2.60 cm  AV Area (Vmean):  2.37 cm  AV Area (VTI):   2.47 cm  AV Vmax:      136.00 cm/s  AV Vmean:     102.700 cm/s  AV VTI:      0.282 m  AV Peak Grad:   7.4 mmHg  AV Mean Grad:   4.3 mmHg  LVOT Vmax:     102.00 cm/s  LVOT Vmean:    70.200 cm/s  LVOT VTI:     0.201 m  LVOT/AV VTI ratio: 0.71    AORTA  Ao Root diam: 3.70 cm  Ao Asc diam: 3.00 cm   MITRAL VALVE  MV Area (PHT): 2.22 cm  SHUNTS  MV Decel Time: 342 msec  Systemic VTI: 0.20 m  MV E velocity: 91.30 cm/s Systemic Diam: 2.10 cm  MV A velocity: 84.00 cm/s  MV E/A ratio: 1.09   Riley Lam MD  Electronically signed by Riley Lam MD  Signature Date/Time: 10/16/2020/7:12:09 PM     Treatments:   10/18/2020 Patient:  Herbert Torres Pre-Op Dx:     NSTEMI                         3V CAD                         HTN    Post-op Dx:  same Procedure: CABG X 4.  LIMA LAD, RSVG, OM3, OM1, D1   Endoscopic greater saphenous vein harvest on the left 40mm Atriclip placement  Surgeon and Role:      * Lightfoot, Eliezer Lofts, MD - Primary    * E.  Shy Guallpa, PA-C - assisting Anesthesia  general EBL:  500 ml Blood Administration: none Xclamp Time:  73 min Pump Time:  127 min  Drains: 19 F blake drain:  L, mediastinal  Wires: none Counts: correct   Indications: 74 year old male transferred from [facility] for an NSTEMI. He admits to a 2 to 3-week history of progressive angina. He does have a history of paroxysmal atrial fibrillation on Eliquis for this. CTS has been consulted to assist with management.  LHC showed severe 3 V CAD.  Findings: PDA was too small for bypass.  Good conduit.  Good sized LIMA.  Calcified LAD proximally.  Calcified OM3.  Small OM1.  Small D1.  Good flows on all vein grafts.  Good post CPB function.  Electrical Cardioversion Procedure Note Herbert Torres [id number] 1946-09-28  Procedure: Electrical Cardioversion Indications:  Atrial Fibrillation  Procedure Details Consent: Risks of procedure as well as the alternatives and risks of each were explained to the (patient/caregiver).  Consent for procedure obtained. Time Out: Verified patient identification, verified procedure, site/side was marked, verified correct patient position, special equipment/implants available, medications/allergies/relevent history reviewed, required imaging and test results available.  Performed  Patient placed on cardiac monitor, pulse oximetry, supplemental oxygen as necessary.  Sedation given: Pt sedated by anesthesia with lidocaine 40 mg and dioprovan 60 mg IV. Pacer pads placed anterior and posterior chest.  Cardioverted 1 time(s).  Cardioverted at 200J. This resulted in sinus rhythm but pt converted back to atrial fibrillation after approximately one minute; would likely continue amiodarone load and repeat DCCV in 2-3 weeks; hopefully with more amiodarone he will hold sinus.  Evaluation Findings: Post procedure EKG shows: Atrial Fibrillation Complications: None Patient did tolerate procedure  well.   Olga Millers 10/27/2020, 10:05 AM   LEFT HEART CATH AND CORONARY ANGIOGRAPHY  Conclusion    Mid RCA to Dist RCA lesion is 100% stenosed.  RV Branch lesion is 90% stenosed.  ---------------------------  Prox LAD to Mid LAD lesion is 80% stenosed with 70% stenosed side branch in 1st Diag & 1st Sept lesion is 70% stenosed.  Mid LAD lesion is 75% stenosed.  ---------------------------  Prox Cx lesion is 75% stenosed.  Lat 3rd Mrg lesion is 80% stenosed.  Diminutive 4th Mrg lesion is 90% stenosed.  Dist Cx / OM lesion is 90% stenosed.  ===================  There is no aortic valve stenosis.  LV end diastolic pressure is normal.  The left ventricular systolic function is normal.  There is trivial (1+) mitral regurgitation.   SUMMARY  Severe multivessel CAD:  ? Possible subacute occlusion or CTO of RCA after RV marginal branch that has an 90% stenosis, with minimal left to right collaterals;  ? Focal eccentric calcified 80% stenosis of the LAD at the takeoff of SP1 (1st Sept) and D1 (1st Diag) along with eccentric 70% mid LAD, and  ? 75% proximal major LCx that has diffuse areas of relatively severe stenosis in the sidebranches.  Relatively normal EF with mild inferior hypokinesis.  Relatively normal LVEDP.   RECOMMENDATIONS  With diabetes and existing A. fib on Eliquis already, best option for this gentleman is CVTS consult for CABG.  Continue aggressive risk factor modification.  Results reviewed with Dr. [place].  CVTS consult called  Bryan Lemma, MD Bryan Lemma, M.D., M.S. Interventional Cardiologist   ECHOCARDIOGRAM REPORT       Patient Name:  Herbert Torres [facility] Date of Exam: 10/16/2020  Medical Rec #: [id number]    Height:    72.0 in  Accession #:  [id number]   Weight:    235.9 lb  Date of Birth: 1946/12/22   BSA:     2.285 m  Patient Age:  73 years    BP:      127/103 mmHg  Patient Gender: M         HR:      70 bpm.  Exam Location: Inpatient   Procedure: 2D Echo, Cardiac Doppler, Color Doppler and Intracardiac       Opacification Agent   Indications:  Chest pain    History:    Patient has no prior history of Echocardiogram  examinations.         Acute MI, Arrythmias:Atrial Fibrillation; Risk         Factors:Hypertension and Former Smoker.    Sonographer:  Ross Ludwig RDCS (AE)  Referring Phys: [phone] [facility] [place]     Sonographer Comments: Suboptimal apical window and suboptimal subcostal  window.  IMPRESSIONS    1. Left ventricular ejection fraction, by estimation, is 50 to 55%. The  left ventricle has low normal function. The left ventricle demonstrates  regional wall motion abnormalities (see scoring diagram/findings for  description). Left ventricular diastolic  parameters are consistent with Grade I diastolic dysfunction (impaired  relaxation).  2. Right ventricular systolic function is normal. The right ventricular  size is mildly enlarged.  3. Left atrial size was mild to moderately dilated.  4. The mitral valve is grossly normal. No evidence of mitral valve  regurgitation.  5. The aortic valve was not well visualized. Aortic valve regurgitation  is not visualized.  6. The inferior vena cava is normal in size with <50% respiratory  variability, suggesting right atrial pressure of 8 mmHg.   Comparison(s): No prior Echocardiogram.   FINDINGS  Left Ventricle: Concentric Remodeling LVMI 107 g/m2; RWT 0.71. Left  ventricular ejection fraction, by estimation, is 50 to 55%. The left  ventricle has low normal function. The left ventricle demonstrates  regional wall motion abnormalities. Definity  contrast agent was given IV to delineate the left ventricular endocardial  borders. The left ventricular internal cavity size was normal in size.  There is no left ventricular hypertrophy. Left ventricular  diastolic  parameters are consistent with Grade I  diastolic dysfunction (impaired relaxation).     LV Wall Scoring:  The inferior wall is hypokinetic.   Right Ventricle: The right ventricular size is mildly enlarged. No  increase in right ventricular wall thickness. Right ventricular systolic  function is normal.   Left Atrium: Left atrial size was mild to moderately dilated.   Right Atrium: Right atrial size was normal in size.   Pericardium: There is no evidence of pericardial effusion. Presence of  pericardial fat pad.   Mitral Valve: The mitral valve is grossly normal. No evidence of mitral  valve regurgitation.   Tricuspid Valve: The tricuspid valve is not well visualized. Tricuspid  valve regurgitation is not demonstrated.   Aortic Valve: The aortic valve was not well visualized. Aortic valve  regurgitation is not visualized. Aortic valve mean gradient measures 4.3  mmHg. Aortic valve peak gradient measures 7.4 mmHg. Aortic valve area, by  VTI measures 2.47 cm.   Pulmonic Valve: The pulmonic valve was not well visualized. Pulmonic valve  regurgitation is not visualized.   Aorta: The aortic root is normal in size and structure.   Venous: The pulmonary veins were not well visualized. The inferior vena  cava is normal in size with less than 50% respiratory variability,  suggesting right atrial pressure of 8 mmHg.   IAS/Shunts: The atrial septum is grossly normal.     LEFT VENTRICLE  PLAX 2D  LVIDd:     4.20 cm Diastology  LVIDs:     3.00 cm LV e' medial:  6.31 cm/s  LV PW:     1.50 cm LV E/e' medial: 14.5  LV IVS:    1.50 cm LV e' lateral:  9.36 cm/s  LVOT diam:   2.10 cm LV E/e' lateral: 9.8  LV SV:     70  LV SV Index:  30  LVOT Area:   3.46 cm     RIGHT VENTRICLE       IVC  RV Basal diam: 3.80 cm   IVC diam: 2.00 cm  RV Mid diam:  3.60 cm  RV S prime:   12.30 cm/s  TAPSE (M-mode): 3.4 cm   LEFT  ATRIUM       Index    RIGHT ATRIUM      Index  LA diam:    3.50 cm 1.53 cm/m RA Area:   20.90 cm  LA Vol (A2C):  80.8 ml 35.35 ml/m RA Volume:  60.20 ml 26.34 ml/m  LA Vol (A4C):  45.1 ml 19.73 ml/m  LA Biplane Vol: 59.9 ml 26.21 ml/m  AORTIC VALVE  AV Area (Vmax):  2.60 cm  AV Area (Vmean):  2.37 cm  AV Area (VTI):   2.47 cm  AV Vmax:      136.00 cm/s  AV Vmean:     102.700 cm/s  AV VTI:      0.282 m  AV Peak Grad:   7.4 mmHg  AV Mean Grad:   4.3 mmHg  LVOT Vmax:     102.00 cm/s  LVOT Vmean:    70.200 cm/s  LVOT VTI:     0.201 m  LVOT/AV VTI ratio: 0.71    AORTA  Ao Root diam: 3.70 cm  Ao Asc diam: 3.00 cm   MITRAL VALVE  MV Area (PHT): 2.22 cm  SHUNTS  MV Decel Time: 342 msec  Systemic VTI: 0.20 m  MV E velocity: 91.30 cm/s Systemic Diam: 2.10 cm  MV A velocity: 84.00 cm/s  MV E/A ratio: 1.09   Riley Lam MD  Electronically signed by Riley Lam MD  Signature Date/Time: 10/16/2020/7:12:09 PM    Discharge Exam: Blood pressure (!) 115/56, pulse 99, temperature 97.8 F (36.6 C), temperature source Oral, resp. rate 20, height 6\' 1"  (1.854 m), weight 101.8 kg, SpO2 96 %.  General appearance: alert, cooperative and no distress Heart: irregularly irregular rhythm Lungs: clear to auscultation bilaterally Abdomen: soft, non-tender; bowel sounds normal; no masses,  no organomegaly Extremities: edema none present Wound: clean and dry   Discharge disposition: 01-Home or Self Care   Discharge Instructions    AMB Referral to Advanced Lipid Disorders Clinic   Complete by: As directed    Reason for referral: Follow-up patients for medication management   Provider to see patient: PharmD   Internal Lipid Clinic Referral Scheduling  Internal lipid clinic referrals are providers within [facility], who wish to refer established patients for routine management (help in starting PCSK9  inhibitor therapy) or advanced therapies.  Internal MD referral criteria:              1. All patients with LDL>190 mg/dL  2. All patients with Triglycerides >500 mg/dL  3. Patients with suspected or confirmed heterozygous familial hyperlipidemia (HeFH) or homozygous familial hyperlipidemia (HoFH)  4. Patients with family history of suspicious for genetic dyslipidemia desiring genetic testing  5. Patients refractory to standard guideline based therapy  6. Patients with statin intolerance (failed 2 statins, one of which must be a high potency statin)  7. Patients who the provider desires to be seen by MD   Internal PharmD referral criteria:   1. Follow-up patients for medication management  2. Follow-up for compliance monitoring  3. Patients for drug education  4. Patients with statin intolerance  5. PCSK9 inhibitor education and prior authorization approvals  6. Patients with triglycerides <500 mg/dL  External Lipid Clinic Referral  External lipid clinic referrals are for providers outside of [facility], considered new clinic patients - automatically routed to MD schedule   Amb Referral to Cardiac Rehabilitation   Complete by: As directed    Diagnosis:  CABG NSTEMI     CABG X ___: 4   After initial evaluation and assessments completed: Virtual Based Care may be provided alone or in conjunction with Phase 2 Cardiac Rehab based on patient barriers.: Yes     Allergies as of 10/29/2020      Reactions   Atorvastatin Other (See Comments)   C/o myalgias in knees      Medication List    STOP taking these medications   amLODipine 10 MG tablet Commonly known  as: NORVASC   hydrALAZINE 25 MG tablet Commonly known as: APRESOLINE   lisinopril 40 MG tablet Commonly known as: ZESTRIL     TAKE these medications   amiodarone 200 MG tablet Commonly known as: PACERONE Take 2 tablets (400 mg total) by mouth 2 (two) times daily. For 5 days, then decrease to 200 mg BID x 7 days,  then decrease to 200 mg daily   apixaban 5 MG Tabs tablet Commonly known as: ELIQUIS Take 5 mg by mouth in the morning and at bedtime.   aspirin 81 MG EC tablet Take 1 tablet (81 mg total) by mouth daily. Swallow whole.   carvedilol 6.25 MG tablet Commonly known as: COREG Take 1 tablet (6.25 mg total) by mouth 2 (two) times daily with a meal. What changed:   medication strength  how much to take  when to take this   [employer] Natural Fish Oil 1000 MG Caps Take 1 capsule by mouth 2 (two) times daily.   dapagliflozin propanediol 10 MG Tabs tablet Commonly known as: Farxiga Take 1 tablet (10 mg total) by mouth daily before breakfast.   digoxin 0.25 MG tablet Commonly known as: LANOXIN Take 1 tablet (0.25 mg total) by mouth daily.   ezetimibe 10 MG tablet Commonly known as: ZETIA Take 1 tablet (10 mg total) by mouth daily.   metFORMIN 1000 MG tablet Commonly known as: GLUCOPHAGE Take 1,000 mg by mouth 2 (two) times daily.   omeprazole 20 MG capsule Commonly known as: PRILOSEC Take 20 mg by mouth daily.   rosuvastatin 20 MG tablet Commonly known as: CRESTOR Take 1 tablet (20 mg total) by mouth daily.   traMADol 50 MG tablet Commonly known as: ULTRAM Take 1 tablet (50 mg total) by mouth every 4 (four) hours as needed for moderate pain.            Durable Medical Equipment  (From admission, onward)         Start     Ordered   10/26/20 1228  For home use only DME 3 n 1  Once        10/26/20 1227          Follow-up Information    Corliss Skains, MD. Go on 11/02/2020.   Specialty: Cardiothoracic Surgery Why: Your appointment is at 12:15pm. Contact information: [address] [place] [address] [phone]        Health, [facility] Follow up.   Specialty: Home Health Services Why: Pre-op referral sent per TCTS surgeon for [facility] - they will f/u post discharge for any HH needs.  Contact information: [address] [place]  [place] [address] [phone]        [facility], [facility] Follow up.   Why: 3n1 arranged- to be delivered to room prior to discharge Contact information: [address] [place] [address] [phone]        Corrin Parker, PA-C. Go on 11/08/2020.   Specialties: Physician Assistant, Cardiology Why: Your appointment is at 3:15pm. Contact information: [address] [place] [address] [place] [address] [phone]              The patient has been discharged on:   1.Beta Blocker:  Yes [ x  ]                              No   [   ]  If No, reason:  2.Ace Inhibitor/ARB: Yes [   ]                                     No  [ x   ]                                     If No, reason: BP too soft  3.Statin:   Yes [ x  ]                  No  [   ]                  If No, reason:  4.Ecasa:  Yes  [ x  ]                  No   [   ]                  If No, reason:     Signed:  Jillyn Hidden PA-C   Abdullah Rizzi, PA-C 10/29/2020, 8:25 AM

## 2020-10-24 ENCOUNTER — Inpatient Hospital Stay (HOSPITAL_COMMUNITY): Payer: Medicare Other

## 2020-10-24 LAB — GLUCOSE, CAPILLARY
Glucose-Capillary: 158 mg/dL — ABNORMAL HIGH (ref 70–99)
Glucose-Capillary: 125 mg/dL — ABNORMAL HIGH (ref 70–99)
Glucose-Capillary: 192 mg/dL — ABNORMAL HIGH (ref 70–99)

## 2020-10-24 LAB — TSH: TSH: 0.975 u[IU]/mL (ref 0.350–4.500)

## 2020-10-24 MED ORDER — CARVEDILOL 25 MG PO TABS
25.0000 mg | ORAL_TABLET | Freq: Two times a day (BID) | ORAL | Status: DC
  Administered 2020-10-24 (×2): 25 mg via ORAL
  Filled 2020-10-24 (×2): qty 1

## 2020-10-24 MED ORDER — ALBUMIN HUMAN 5 % IV SOLN
25.0000 g | Freq: Once | INTRAVENOUS | Status: AC
  Administered 2020-10-24: 25 g via INTRAVENOUS
  Filled 2020-10-24: qty 500
  Filled 2020-10-24: qty 250

## 2020-10-24 MED ORDER — METFORMIN HCL 500 MG PO TABS
1000.0000 mg | ORAL_TABLET | Freq: Two times a day (BID) | ORAL | Status: DC
  Administered 2020-10-24 – 2020-10-29 (×10): 1000 mg via ORAL
  Filled 2020-10-24 (×10): qty 2

## 2020-10-24 NOTE — Progress Notes (Signed)
Pt. BP dropped after getting up with cardiac rehab erin PA at bed side and assessed pt. No further orders at this time.    1038 rapid RN at bed bedside assessed pt. Pt states he feels yucky. BP 73/50(59) HR90 Sp02 96 2 L will continue to monitor.   Everlean Cherry, RN

## 2020-10-24 NOTE — Progress Notes (Signed)
CARDIAC REHAB PHASE I   PRE:  Rate/Rhythm: 105 afib    BP: lying 107/59    SaO2: 94 RA  MODE:  Ambulation: 10 ft in hall   POST:  Rate/Rhythm: 95 afib    BP: sitting 83/59, recheck 81/56     SaO2: 96 RA    BP in bed: 76/55, recheck 75/58  Pt in bed, had not been up yet. Moved to EOB of bed without problem but once in hall sts "I don't think I should walk". C/o feeling weak. Returned to recliner, somewhat reclined. BP in 80s. Pt continued to feel weak, diaphoretic, lightheaded after sitting for 10 min and drinking water. Moved to bed, pt not thinking completely clear with transition. BP in 70s in bed. Layed flat, RN started IV fluids. Pt still diaphoretic. Left in RNs hands.  [id number]  Herbert Torres CES, ACSM 10/24/2020 10:21 AM

## 2020-10-24 NOTE — Progress Notes (Addendum)
      [address]       [place] [address]             [phone]      6 Days Post-Op Procedure(s) (LRB): CORONARY ARTERY BYPASS GRAFTING (CABG) TIMES FOUR USING LEFT INTERNAL MAMMARY ARTERY AND RIGHT LEG GREATER SAPHENOUS VEIN HARVESTED ENDOSCOPICALLY Flowtrack only (N/A) TRANSESOPHAGEAL ECHOCARDIOGRAM (TEE) (N/A) CLIPPING OF LEFT ATRIAL APPENDAGE USING ATRICURE ATRICLIP SIZE 40 (Left)   Subjective:  Patient without complaints.  Feels like breathing has improved, wants his phone.  Objective: Vital signs in last 24 hours: Temp:  [97.6 F (36.4 C)-98 F (36.7 C)] 97.7 F (36.5 C) (11/02 0358) Pulse Rate:  [101-120] 110 (11/02 0358) Cardiac Rhythm: Atrial fibrillation (11/01 2037) Resp:  [16-20] 20 (11/02 0358) BP: (94-133)/(56-85) 117/73 (11/02 0358) SpO2:  [91 %-97 %] 92 % (11/02 0358) Weight:  [103.8 kg] 103.8 kg (11/02 0630)  Intake/Output from previous day: 11/01 0701 - 11/02 0700 In: 720 [P.O.:720] Out: 975 [Urine:975]  General appearance: alert, cooperative and no distress Heart: irregularly irregular rhythm Lungs: clear to auscultation bilaterally Abdomen: soft, non-tender; bowel sounds normal; no masses,  no organomegaly Extremities: edema trace Wound: ecchymosis RLE  Lab Results: Recent Labs    10/22/20 0308 10/23/20 0131  WBC 15.2* 11.6*  HGB 12.5* 11.7*  HCT 37.7* 34.9*  PLT 408* 441*   BMET:  Recent Labs    10/22/20 0308 10/23/20 0131  NA 133* 135  K 4.4 3.5  CL 96* 96*  CO2 25 28  GLUCOSE 165* 151*  BUN 32* 29*  CREATININE 1.25* 1.25*  CALCIUM 8.0* 8.0*    PT/INR: No results for input(s): LABPROT, INR in the last 72 hours. ABG    Component Value Date/Time   PHART 7.334 (L) 10/19/2020 0032   HCO3 20.5 10/19/2020 0032   TCO2 22 10/19/2020 0032   ACIDBASEDEF 5.0 (H) 10/19/2020 0032   O2SAT 96.0 10/19/2020 0032   CBG (last 3)  Recent Labs    10/23/20 1526 10/23/20 2047 10/24/20 0628  GLUCAP 138* 233* 158*     Assessment/Plan: S/P Procedure(s) (LRB): CORONARY ARTERY BYPASS GRAFTING (CABG) TIMES FOUR USING LEFT INTERNAL MAMMARY ARTERY AND RIGHT LEG GREATER SAPHENOUS VEIN HARVESTED ENDOSCOPICALLY Flowtrack only (N/A) TRANSESOPHAGEAL ECHOCARDIOGRAM (TEE) (N/A) CLIPPING OF LEFT ATRIAL APPENDAGE USING ATRICURE ATRICLIP SIZE 40 (Left)  1. CV- Atrial Fibrillation, rate control remains an issue- he is on Amiodarone 400 mg BID, Lopressor 50 mg BID, Eliquis.... will change Lopressor to Coreg 25 mg BID, which is what patient took prior to surgery, may benefit from Digoxin if this doesn't help rate, check TSH 2. Pulm- no acute issues, off oxygen, Thoracentesis removed 750 cc of blood tinged fluid yesterday, CXR with small left pleural effusion 3. Renal- creatinine has been stable, K was low yesterday, repeat BMET in AM, 4. DM- sugars are uncontrolled, will place on home dosing of Metformin 5. Dispo- patient remains in A. Fib with uncontrolled rate... his Amiodarone and Lopressor dose have been adjusted, may need to add Digoxin to see if we can improve HR, will discuss with Dr. Cliffton Asters, repeat BMET in AM, check TSH   LOS: 9 days   Lowella Dandy, PA-C 10/24/2020  Remains in atrial fibrillation. Received a dose of carvedilol Lopressor and amiodarone all at once patient has been hypotensive. 5% albumin.  If his blood pressure does not improve we will need to transfer him to the ICU for glucagon drip.  Ninoska Goswick Keane Scrape

## 2020-10-24 NOTE — Progress Notes (Signed)
Pt HR jumping between 128-140 BP 129/71  IV 5 mg Metoprolol given due to increase in HR per PRN order. Will continue to monitor   Everlean Cherry, RN

## 2020-10-24 NOTE — Progress Notes (Signed)
Mobility Specialist - Progress Note   10/24/20 1256  Mobility  Activity Contraindicated/medical hold   Instructed by RN not to see pt today.   Mamie Levers Mobility Specialist Mobility Specialist Phone: [phone]

## 2020-10-25 DIAGNOSIS — I4891 Unspecified atrial fibrillation: Secondary | ICD-10-CM

## 2020-10-25 LAB — BASIC METABOLIC PANEL
Anion gap: 15 (ref 5–15)
BUN: 32 mg/dL — ABNORMAL HIGH (ref 8–23)
CO2: 27 mmol/L (ref 22–32)
Calcium: 8.2 mg/dL — ABNORMAL LOW (ref 8.9–10.3)
Chloride: 91 mmol/L — ABNORMAL LOW (ref 98–111)
Creatinine, Ser: 1.32 mg/dL — ABNORMAL HIGH (ref 0.61–1.24)
GFR, Estimated: 56 mL/min — ABNORMAL LOW (ref >60)
Glucose, Bld: 157 mg/dL — ABNORMAL HIGH (ref 70–99)
Potassium: 3.8 mmol/L (ref 3.5–5.1)
Sodium: 133 mmol/L — ABNORMAL LOW (ref 135–145)

## 2020-10-25 LAB — GLUCOSE, CAPILLARY
Glucose-Capillary: 171 mg/dL — ABNORMAL HIGH (ref 70–99)
Glucose-Capillary: 192 mg/dL — ABNORMAL HIGH (ref 70–99)
Glucose-Capillary: 159 mg/dL — ABNORMAL HIGH (ref 70–99)
Glucose-Capillary: 130 mg/dL — ABNORMAL HIGH (ref 70–99)
Glucose-Capillary: 126 mg/dL — ABNORMAL HIGH (ref 70–99)

## 2020-10-25 MED ORDER — ALBUMIN HUMAN 5 % IV SOLN
25.0000 g | Freq: Once | INTRAVENOUS | Status: AC
  Administered 2020-10-25: 25 g via INTRAVENOUS
  Filled 2020-10-25: qty 500

## 2020-10-25 MED ORDER — CARVEDILOL 12.5 MG PO TABS: 12.5000 mg | ORAL_TABLET | Freq: Two times a day (BID) | ORAL | Status: DC

## 2020-10-25 MED ORDER — ALBUMIN HUMAN 5 % IV SOLN
12.5000 g | Freq: Once | INTRAVENOUS | Status: DC
Start: 2020-10-25 — End: 2020-10-25

## 2020-10-25 NOTE — Progress Notes (Signed)
Mobility Specialist - Progress Note   10/25/20 1727  Mobility  Activity Ambulated in hall  Level of Assistance Standby assist, set-up cues, supervision of patient - no hands on  Assistive Device Front wheel walker  Distance Ambulated (ft) 100 ft  Mobility Response Tolerated well  Mobility performed by Mobility specialist  $Mobility charge 1 Mobility    Pre-mobility: 107 HR, 119/76 BP, 95% SpO2 During mobility: 117 HR, 86-94% SpO2 Post-mobility: 111 HR, 112/85 BP, 97% SpO2  Pt w/ brief desat to 86% while ambulating, it rose to 94% SpO2 w/ pursed lip breathing. Pt otherwise asx throughout ambulation.  Mamie Levers Mobility Specialist Mobility Specialist Phone: [phone]

## 2020-10-25 NOTE — Evaluation (Signed)
Physical Therapy Evaluation Patient Details Name: Herbert Torres MRN: [id number] DOB: [birth year] Today's Date: 10/25/2020   History of Present Illness  74 y.o. male with paroxysmal atrial fibrillation, prior tobacco abuse (quit 30 years ago), diabetes, hypertension, and hyperlipidemia Presented to [facility] 10/15/20 EKG had no ischemic changes other than very mild lateral ST depression.  High-sensitivity troponin was elevated to 266.  Admitted with NSTEMI started on IV heparin and transferred to [facility] for cardiac catheterization.s/p 10/27 CORONARY ARTERY BYPASS GRAFTING (CABG) TIMES FOUR USING LEFT INTERNAL MAMMARY ARTERY AND RIGHT LEG GREATER SAPHENOUS VEIN HARVESTED ENDOSCOPICALLY. Pt experiencing symptomatic orthostatic hypotension post sx.   Clinical Impression  PTA pt reports living alone, completely independent in mobility, ADLs and iADLs. Pt reports he will go home with his son who lives in a single story home with level entry. Pt is currently limited in safe mobility by generalized weakness and intermittent dizziness although pt not orthostatic during this session. Pt is min guard for bed mobility, transfers and stepping to recliner next to bed. Pt will likely not need any PT services at discharge, however PT will continue to see acutely to progress mobility.     Follow Up Recommendations No PT follow up;Supervision for mobility/OOB    Equipment Recommendations  Rolling walker with 5" wheels;3in1 (PT)       Precautions / Restrictions Precautions Precautions: Sternal Precaution Comments: reminded pt of sternal precaution, while reaching for bed rail to pull up  Restrictions Weight Bearing Restrictions: Yes Other Position/Activity Restrictions: sternal precautions      Mobility  Bed Mobility Overal bed mobility: Needs Assistance Bed Mobility: Supine to Sit     Supine to sit: Min guard;HOB elevated     General bed mobility comments: min guard for safety, needs reminder  of sternal precaution     Transfers Overall transfer level: Needs assistance Equipment used: Rolling walker (2 wheeled) Transfers: Sit to/from Stand Sit to Stand: Min guard         General transfer comment: min guard for safety with power up to RW   Ambulation/Gait Ambulation/Gait assistance: Min guard Gait Distance (Feet): 3 Feet Assistive device: Rolling walker (2 wheeled) Gait Pattern/deviations: Step-through pattern;Decreased step length - right;Decreased step length - left Gait velocity: slowed  Gait velocity interpretation: <1.31 ft/sec, indicative of household ambulator General Gait Details: min guard for stepping from bed to chair        Balance Overall balance assessment: Mild deficits observed, not formally tested                                           Pertinent Vitals/Pain Pain Assessment: No/denies pain    Home Living Family/patient expects to be discharged to:: Private residence Living Arrangements: Children Available Help at Discharge: Available PRN/intermittently;Family Type of Home: House Home Access: Level entry     Home Layout: One level Home Equipment: None      Prior Function Level of Independence: Independent         Comments: just quit work as a Surveyor, 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 and doctor advised he shouldn't be climbing ladders anymore        Extremity/Trunk Assessment   Upper Extremity Assessment Upper Extremity Assessment: Generalized weakness    Lower Extremity Assessment Lower Extremity Assessment: Generalized weakness       Communication   Communication: HOH  Cognition Arousal/Alertness:  Awake/alert Behavior During Therapy: WFL for tasks assessed/performed Overall Cognitive Status: Within Functional Limits for tasks assessed                                        General Comments General comments (skin integrity, edema, etc.): pt with more consistent BP      Exercises General Exercises - Lower Extremity Ankle Circles/Pumps: AROM;Both;20 reps;Seated   Assessment/Plan    PT Assessment Patient needs continued PT services  PT Problem List Decreased strength;Cardiopulmonary status limiting activity       PT Treatment Interventions DME instruction;Gait training;Stair training;Functional mobility training;Therapeutic activities;Therapeutic exercise;Balance training;Cognitive remediation;Patient/family education    PT Goals (Current goals can be found in the Care Plan section)  Acute Rehab PT Goals Patient Stated Goal: feel better PT Goal Formulation: With patient Time For Goal Achievement: 11/08/20 Potential to Achieve Goals: Good    Frequency Min 3X/week    AM-PAC PT "6 Clicks" Mobility  Outcome Measure Help needed turning from your back to your side while in a flat bed without using bedrails?: None Help needed moving from lying on your back to sitting on the side of a flat bed without using bedrails?: A Little Help needed moving to and from a bed to a chair (including a wheelchair)?: A Little Help needed standing up from a chair using your arms (e.g., wheelchair or bedside chair)?: A Little Help needed to walk in hospital room?: A Little Help needed climbing 3-5 steps with a railing? : A Little 6 Click Score: 19    End of Session Equipment Utilized During Treatment: Gait belt Activity Tolerance: Patient tolerated treatment well Patient left: in chair;with call bell/phone within reach Nurse Communication: Mobility status PT Visit Diagnosis: Muscle weakness (generalized) (M62.81)    Time: 5427-0623 PT Time Calculation (min) (ACUTE ONLY): 25 min   Charges:   PT Evaluation $PT Eval Moderate Complexity: 1 Mod PT Treatments $Therapeutic Activity: 8-22 mins        Brilee Port B. Beverely Risen PT, DPT Acute Rehabilitation Services Pager [phone] Office [phone]   Elon Al[facility] 10/25/2020, 4:47 PM

## 2020-10-25 NOTE — Progress Notes (Signed)
Progress Note  Patient Name: Herbert Torres Date of Encounter: 10/25/2020  [facility] Cardiologist: Chilton Si, MD   Subjective   No chest pain or shortness of breath.  The patient complains of weakness and dizziness when he is up and moving around.  He has required albumin today for blood pressure support.  His heart rate remains elevated and he has not been able to mobilize much because of tachycardia.  Inpatient Medications    Scheduled Meds: . amiodarone  400 mg Oral BID  . apixaban  5 mg Oral BID  . aspirin EC  81 mg Oral Daily  . bisacodyl  10 mg Oral Daily   Or  . bisacodyl  10 mg Rectal Daily  . carvedilol  12.5 mg Oral BID WC  . Chlorhexidine Gluconate Cloth  6 each Topical Daily  . docusate sodium  200 mg Oral Daily  . ezetimibe  10 mg Oral Daily  . insulin aspart  0-24 Units Subcutaneous TID AC  . lidocaine  2 patch Transdermal Q24H  . metFORMIN  1,000 mg Oral BID WC  . pantoprazole  40 mg Oral Daily  . potassium chloride  40 mEq Oral Daily  . rosuvastatin  20 mg Oral Daily  . sorbitol  30 mL Oral Once   Continuous Infusions:  PRN Meds: lidocaine, metoprolol tartrate, ondansetron (ZOFRAN) IV, oxyCODONE, traMADol   Vital Signs    Vitals:   10/25/20 0400 10/25/20 0427 10/25/20 0800 10/25/20 1128  BP: (!) 109/59 99/61 (!) 118/57 115/82  Pulse:  100 (!) 105 (!) 104  Resp: (!) 21 17 17 17   Temp:  98.4 F (36.9 C) 97.8 F (36.6 C)   TempSrc:  Oral Oral   SpO2: 96% 95% 96% 94%  Weight:      Height:        Intake/Output Summary (Last 24 hours) at 10/25/2020 1431 Last data filed at 10/25/2020 1302 Gross per 24 hour  Intake 720 ml  Output 1051 ml  Net -331 ml   Last 3 Weights 10/24/2020 10/23/2020 10/22/2020  Weight (lbs) 228 lb 12.8 oz 230 lb 11.2 oz 239 lb 6.7 oz  Weight (kg) 103.783 kg 104.645 kg 108.6 kg      Telemetry    Atrial fibrillation with resting heart rate 100 to 110 bpm - Personally Reviewed   Physical Exam  Alert,  oriented male in no distress GEN: No acute distress.   Neck: No JVD Cardiac:  Irregularly irregular, no murmurs, rubs, or gallops.  Respiratory: Clear to auscultation bilaterally. GI: Soft, nontender, non-distended  MS: No edema; No deformity. Neuro:  Nonfocal  Psych: Normal affect   Labs    High Sensitivity Troponin:   Recent Labs  Lab 10/15/20 0958 10/15/20 1126 10/15/20 1656 10/15/20 1926  TROPONINIHS 266* 338* 423* 458*      Chemistry Recent Labs  Lab 10/22/20 0308 10/23/20 0131 10/25/20 0110  NA 133* 135 133*  K 4.4 3.5 3.8  CL 96* 96* 91*  CO2 25 28 27   GLUCOSE 165* 151* 157*  BUN 32* 29* 32*  CREATININE 1.25* 1.25* 1.32*  CALCIUM 8.0* 8.0* 8.2*  GFRNONAA >60 >60 56*  ANIONGAP 12 11 15      Hematology Recent Labs  Lab 10/21/20 0232 10/22/20 0308 10/23/20 0131  WBC 20.9* 15.2* 11.6*  RBC 4.10* 4.14* 3.84*  HGB 12.7* 12.5* 11.7*  HCT 37.8* 37.7* 34.9*  MCV 92.2 91.1 90.9  MCH 31.0 30.2 30.5  MCHC 33.6 33.2 33.5  RDW 13.2 13.0 13.1  PLT 298 408* 441*    BNPNo results for input(s): BNP, PROBNP in the last 168 hours.   DDimer No results for input(s): DDIMER in the last 168 hours.   Radiology    DG Chest 2 View  Result Date: 10/24/2020 CLINICAL DATA:  Shortness of breath.  Left pleural effusion. EXAM: CHEST - 2 VIEW COMPARISON:  10/23/2020. FINDINGS: Prior CABG. Left atrial appendage clip in stable position. Stable cardiomegaly. No pulmonary venous congestion. Persistent left base atelectasis/infiltrate and small left pleural effusion. No pneumothorax. IMPRESSION: 1. Prior CABG. Stable cardiomegaly. No pulmonary venous congestion. 2. Persistent left base atelectasis/infiltrate and small left pleural effusion. No interim change. No pneumothorax. Electronically Signed   By: Maisie Fus  Register   On: 10/24/2020 06:27     Patient Profile     74 y.o. male with paroxysmal atrial fibrillation and type 2 diabetes presenting with non-STEMI found to have  multivessel coronary artery disease, undergoes CABG 10/18/2020  Assessment & Plan    1.  Persistent atrial fibrillation: The patient has been in and out of atrial fibrillation since CABG on 10/18/2020.  In reviewing notes, it looks like he has been persistently in atrial fibrillation since October 28.  He has been consistently anticoagulated with apixaban since 1028.  The patient underwent left atrial appendage clipping at the time of his surgery.  He is unable to progress because of elevated heart rate while in atrial fibrillation along with low blood pressure.  He has not tolerated carvedilol or metoprolol very well.  Would continue his current treatment which includes amiodarone and carvedilol at reduced dose.  I agree that it would be best to cardiovert him to try to restore sinus rhythm.  His left atrium demonstrates mild to moderate chamber dilatation by echo.  Hopefully with amiodarone on board he will maintain sinus rhythm.  I discussed risks, indications, and alternatives to cardioversion with him today. He would like to proceed.  We will try to get him added to the schedule tomorrow and will make him n.p.o. after midnight.  If unable to add him tomorrow, will do his cardioversion Friday morning at the latest.   For questions or updates, please contact [facility] Please consult [email or web address] for contact info under      Signed, Tonny Bollman, MD  10/25/2020, 2:31 PM

## 2020-10-25 NOTE — Progress Notes (Addendum)
      [address]       [place] [address]             [phone]      7 Days Post-Op Procedure(s) (LRB): CORONARY ARTERY BYPASS GRAFTING (CABG) TIMES FOUR USING LEFT INTERNAL MAMMARY ARTERY AND RIGHT LEG GREATER SAPHENOUS VEIN HARVESTED ENDOSCOPICALLY Flowtrack only (N/A) TRANSESOPHAGEAL ECHOCARDIOGRAM (TEE) (N/A) CLIPPING OF LEFT ATRIAL APPENDAGE USING ATRICURE ATRICLIP SIZE 40 (Left)   Subjective:  The patient has no new complaints.  The didn't ambulate yesterday stating he wasn't allowed.   Objective: Vital signs in last 24 hours: Temp:  [97.6 F (36.4 C)-98.5 F (36.9 C)] 98.4 F (36.9 C) (11/03 0427) Pulse Rate:  [90-103] 100 (11/03 0427) Cardiac Rhythm: Atrial fibrillation (11/02 1900) Resp:  [16-26] 17 (11/03 0427) BP: (76-129)/(40-81) 99/61 (11/03 0427) SpO2:  [81 %-98 %] 95 % (11/03 0427)  Intake/Output from previous day: 11/02 0701 - 11/03 0700 In: 240 [P.O.:240] Out: 1301 [Urine:1300; Stool:1]  General appearance: alert, cooperative and no distress Heart: irregularly irregular rhythm Lungs: clear to auscultation bilaterally Abdomen: soft, non-tender; bowel sounds normal; no masses,  no organomegaly Extremities: edema none  Wound: clean and dry  Lab Results: Recent Labs    10/23/20 0131  WBC 11.6*  HGB 11.7*  HCT 34.9*  PLT 441*   BMET:  Recent Labs    10/23/20 0131 10/25/20 0110  NA 135 133*  K 3.5 3.8  CL 96* 91*  CO2 28 27  GLUCOSE 151* 157*  BUN 29* 32*  CREATININE 1.25* 1.32*  CALCIUM 8.0* 8.2*    PT/INR: No results for input(s): LABPROT, INR in the last 72 hours. ABG    Component Value Date/Time   PHART 7.334 (L) 10/19/2020 0032   HCO3 20.5 10/19/2020 0032   TCO2 22 10/19/2020 0032   ACIDBASEDEF 5.0 (H) 10/19/2020 0032   O2SAT 96.0 10/19/2020 0032   CBG (last 3)  Recent Labs    10/24/20 1647 10/24/20 2110 10/25/20 0614  GLUCAP 125* 192* 192*    Assessment/Plan: S/P Procedure(s) (LRB): CORONARY  ARTERY BYPASS GRAFTING (CABG) TIMES FOUR USING LEFT INTERNAL MAMMARY ARTERY AND RIGHT LEG GREATER SAPHENOUS VEIN HARVESTED ENDOSCOPICALLY Flowtrack only (N/A) TRANSESOPHAGEAL ECHOCARDIOGRAM (TEE) (N/A) CLIPPING OF LEFT ATRIAL APPENDAGE USING ATRICURE ATRICLIP SIZE 40 (Left)  1. CV- Atrial Fibrillation, rate is improved, now Hypotensive after administration of IV Lopressor, Coreg yesterday... BP didn't fully recover.. have decreased dose of Coreg to 12.5 mg BID which is home regimen, continue Amiodarone 400 mg BID, continue Eliquis... will repeat Albumin this morning, hemoglobin level is stable and about 10 ... ? Benefit from attempt at cardioversion? 2. Pulm- no acute issues, continue IS 3. Renal- creatinine at 1.32 which is stable, weigth is 12 lbs below admission, in setting of hypotension will d/c Lasix, K is borderline at 3.5 will repeat potassium dose today 4. TSH- 0.9 which is WNL 5. DM- sugar control improved 6. Dispo- patient stable, remains hypotensive, will repeat Albumin this morning, decrease Coreg to home dose of 12.5 mg BID and hold until BP recover, continue Amiodarone, repeat Albumin this morning as weight is down about 12 lbs from admission, d/c Lasix   LOS: 10 days    Lowella Dandy, PA-C 10/25/2020  Agree with above BP improving.   Will discuss cardioversion with cardiology Holding lasix  Hally Colella Keane Scrape

## 2020-10-25 NOTE — Progress Notes (Signed)
CARDIAC REHAB PHASE I   PRE:  Rate/Rhythm: 107 afib    BP: lying 115/82, sitting 112/67, standing 91/57    SaO2: 96 RA  MODE:  Ambulation: to recliner   POST:  Rate/Rhythm: 120 afib    BP: sitting 117/79     SaO2: 98 RA  Pt receiving albumin. Slowly up to EOB, took orthostatics. While taking BP standing, pt sts "I need to sit". Sat and stated he felt woozy, not as bad as yesterday. BP 91/57. After a few minutes of rest pt still feeling woozy but agreed to transfer to recliner. Reclined pt in recliner, BP eventually 117/79. Encouraged pt to sit up and try to walk later. PT later today.  [id number]   Herbert Torres CES, ACSM 10/25/2020 12:38 PM

## 2020-10-26 LAB — BASIC METABOLIC PANEL
Anion gap: 13 (ref 5–15)
BUN: 32 mg/dL — ABNORMAL HIGH (ref 8–23)
CO2: 27 mmol/L (ref 22–32)
Calcium: 8.2 mg/dL — ABNORMAL LOW (ref 8.9–10.3)
Chloride: 92 mmol/L — ABNORMAL LOW (ref 98–111)
Creatinine, Ser: 1.38 mg/dL — ABNORMAL HIGH (ref 0.61–1.24)
GFR, Estimated: 53 mL/min — ABNORMAL LOW (ref >60)
Glucose, Bld: 155 mg/dL — ABNORMAL HIGH (ref 70–99)
Potassium: 4 mmol/L (ref 3.5–5.1)
Sodium: 132 mmol/L — ABNORMAL LOW (ref 135–145)

## 2020-10-26 LAB — GLUCOSE, CAPILLARY
Glucose-Capillary: 153 mg/dL — ABNORMAL HIGH (ref 70–99)
Glucose-Capillary: 200 mg/dL — ABNORMAL HIGH (ref 70–99)
Glucose-Capillary: 126 mg/dL — ABNORMAL HIGH (ref 70–99)
Glucose-Capillary: 206 mg/dL — ABNORMAL HIGH (ref 70–99)

## 2020-10-26 MED ORDER — POTASSIUM CHLORIDE CRYS ER 10 MEQ PO TBCR
10.0000 meq | EXTENDED_RELEASE_TABLET | Freq: Every day | ORAL | Status: DC
  Administered 2020-10-26 – 2020-10-29 (×4): 10 meq via ORAL
  Filled 2020-10-26 (×4): qty 1

## 2020-10-26 MED ORDER — BISACODYL 5 MG PO TBEC: 10.0000 mg | DELAYED_RELEASE_TABLET | Freq: Every day | ORAL | Status: DC | PRN

## 2020-10-26 MED ORDER — DOCUSATE SODIUM 100 MG PO CAPS: 200.0000 mg | ORAL_CAPSULE | Freq: Every day | ORAL | Status: DC | PRN

## 2020-10-26 MED ORDER — CARVEDILOL 6.25 MG PO TABS
6.2500 mg | ORAL_TABLET | Freq: Two times a day (BID) | ORAL | Status: DC
  Administered 2020-10-26 – 2020-10-29 (×6): 6.25 mg via ORAL
  Filled 2020-10-26 (×6): qty 1

## 2020-10-26 MED ORDER — BISACODYL 10 MG RE SUPP: 10.0000 mg | Freq: Every day | RECTAL | Status: DC | PRN

## 2020-10-26 MED FILL — Sodium Chloride IV Soln 0.9%: INTRAVENOUS | Qty: 1000 | Status: AC

## 2020-10-26 MED FILL — Lidocaine HCl Local Preservative Free (PF) Inj 2%: INTRAMUSCULAR | Qty: 15 | Status: AC

## 2020-10-26 MED FILL — Calcium Chloride Inj 10%: INTRAVENOUS | Qty: 10 | Status: AC

## 2020-10-26 MED FILL — Albumin, Human Inj 5%: INTRAVENOUS | Qty: 250 | Status: AC

## 2020-10-26 MED FILL — Sodium Bicarbonate IV Soln 8.4%: INTRAVENOUS | Qty: 50 | Status: AC

## 2020-10-26 MED FILL — Electrolyte-R (PH 7.4) Solution: INTRAVENOUS | Qty: 6000 | Status: AC

## 2020-10-26 MED FILL — Heparin Sodium (Porcine) Inj 1000 Unit/ML: INTRAMUSCULAR | Qty: 20 | Status: AC

## 2020-10-26 MED FILL — Mannitol IV Soln 20%: INTRAVENOUS | Qty: 500 | Status: AC

## 2020-10-26 NOTE — TOC Initial Note (Signed)
Transition of Care (TOC) - Initial/Assessment Note  Donn Pierini RN, BSN Transitions of Care Unit 4E- RN Case Manager See Treatment Team for direct phone #    Patient Details  Name: Herbert Torres MRN: [id number] Date of Birth: [birth year]  Transition of Care [facility] CM/SW Contact:    Darrold Span, RN Phone Number: 10/26/2020, 2:15 PM  Clinical Narrative:                 Pt admitted NSTEMI, s/p CABG with post op afib. Pt from home alone, Notified by Tiffany with [facility] pt has pre-op referral from [facility] for [facility] needs.  CM spoke with pt and son at bedside- discussed [facility] referral for [facility]- pt and son both agreeable to services. Per son they have RW for home, need 3n1 for home- order placed and will call [employer] line for delivery to room prior to discharge.  Per son pt is going to stay with him at son's home for awhile address to son is: [id number] [facility] [address], [place] [address] Phone contact- Marcelino Duster (daughter)- [phone]  Call made to Tiffany with [facility] to update on discharge address.    Expected Discharge Plan: Home/Self Care Barriers to Discharge: Continued Medical Work up   Patient Goals and CMS Choice Patient states their goals for this hospitalization and ongoing recovery are:: return home with son CMS Medicare.gov Compare Post Acute Care list provided to:: Patient Choice offered to / list presented to : Patient  Expected Discharge Plan and Services Expected Discharge Plan: Home/Self Care   Discharge Planning Services: CM Consult Post Acute Care Choice: Home Health, Durable Medical Equipment Living arrangements for the past 2 months: Single Family Home                 DME Arranged: 3-N-1 DME Agency: [employer]         HH Agency: [facility] Date HH Agency Contacted: 10/26/20 Time HH Agency Contacted: 1200 Representative spoke with at [facility] Agency: TIffany  Prior Living Arrangements/Services Living arrangements for the past 2  months: Single Family Home Lives with:: Adult Children (to go home with son) Patient language and need for interpreter reviewed:: Yes Do you feel safe going back to the place where you live?: Yes      Need for Family Participation in Patient Care: Yes (Comment) Care giver support system in place?: Yes (comment) Current home services: DME (walker) Criminal Activity/Legal Involvement Pertinent to Current Situation/Hospitalization: No - Comment as needed  Activities of Daily Living Home Assistive Devices/Equipment: Hearing aid ADL Screening (condition at time of admission) Patient's cognitive ability adequate to safely complete daily activities?: Yes Is the patient deaf or have difficulty hearing?: Yes Does the patient have difficulty seeing, even when wearing glasses/contacts?: No Does the patient have difficulty concentrating, remembering, or making decisions?: No Patient able to express need for assistance with ADLs?: Yes Does the patient have difficulty dressing or bathing?: No Independently performs ADLs?: Yes (appropriate for developmental age) Does the patient have difficulty walking or climbing stairs?: Yes Weakness of Legs: Both Weakness of Arms/Hands: None  Permission Sought/Granted Permission sought to share information with : Facility Industrial/product designer granted to share information with : Yes, Verbal Permission Granted     Permission granted to share info w AGENCY: HH/DME        Emotional Assessment Appearance:: Appears stated age Attitude/Demeanor/Rapport: Reactive Affect (typically observed): Accepting, Appropriate Orientation: : Oriented to Self, Oriented to Place, Oriented to  Time, Oriented to  Situation Alcohol / Substance Use: Not Applicable Psych Involvement: No (comment)  Admission diagnosis:  NSTEMI (non-ST elevated myocardial infarction) [facility]) [I21.4] Patient Active Problem List   Diagnosis Date Noted  . 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 S/P  Clipping of Left Atrial Appendage 10/18/2020  . NSTEMI (non-ST elevated myocardial infarction) (HCC) 10/15/2020  . Diabetes mellitus treated with oral medication (HCC)   . Essential hypertension   . Pure hypercholesterolemia    PCP:  [facility], [facility] Pharmacy:   [facility] [address], [place] - [address] [place] [address] Phone: [phone] Fax: [phone]     Social Determinants of Health (SDOH) Interventions    Readmission Risk Interventions No flowsheet data found.

## 2020-10-26 NOTE — Progress Notes (Signed)
Blood Pressures bilateral arms/automatic & manual; sitting position:   Right arm: 0933am  113/70  Automatic 942am 132/66  Manual   Left arm: 0934 130/82 Automatic  0943 132/72 Manual

## 2020-10-26 NOTE — Progress Notes (Addendum)
      [address]       [place] [address]             [phone]      8 Days Post-Op Procedure(s) (LRB): CORONARY ARTERY BYPASS GRAFTING (CABG) TIMES FOUR USING LEFT INTERNAL MAMMARY ARTERY AND RIGHT LEG GREATER SAPHENOUS VEIN HARVESTED ENDOSCOPICALLY Flowtrack only (N/A) TRANSESOPHAGEAL ECHOCARDIOGRAM (TEE) (N/A) CLIPPING OF LEFT ATRIAL APPENDAGE USING ATRICURE ATRICLIP SIZE 40 (Left)   Subjective:  Patient states feeling good today.  He was able to ambulate some yesterday.  For cardioversion today.  Objective: Vital signs in last 24 hours: Temp:  [97.6 F (36.4 C)-98 F (36.7 C)] 98 F (36.7 C) (11/04 0454) Pulse Rate:  [95-105] 99 (11/04 0454) Cardiac Rhythm: Atrial fibrillation (11/03 1901) Resp:  [17-20] 20 (11/04 0454) BP: (115-126)/(57-84) 122/84 (11/04 0454) SpO2:  [92 %-96 %] 93 % (11/04 0454)  Intake/Output from previous day: 11/03 0701 - 11/04 0700 In: 480 [P.O.:480] Out: 500 [Urine:500]  General appearance: alert, cooperative and no distress Heart: irregularly irregular rhythm Lungs: clear to auscultation bilaterally Abdomen: soft, non-tender; bowel sounds normal; no masses,  no organomegaly Extremities: edema none Wound: clean and dry  Lab Results: No results for input(s): WBC, HGB, HCT, PLT in the last 72 hours. BMET: Recent Labs    10/25/20 0110 10/26/20 0109  NA 133* 132*  K 3.8 4.0  CL 91* 92*  CO2 27 27  GLUCOSE 157* 155*  BUN 32* 32*  CREATININE 1.32* 1.38*  CALCIUM 8.2* 8.2*    PT/INR: No results for input(s): LABPROT, INR in the last 72 hours. ABG    Component Value Date/Time   PHART 7.334 (L) 10/19/2020 0032   HCO3 20.5 10/19/2020 0032   TCO2 22 10/19/2020 0032   ACIDBASEDEF 5.0 (H) 10/19/2020 0032   O2SAT 96.0 10/19/2020 0032   CBG (last 3)  Recent Labs    10/25/20 1709 10/25/20 2143 10/26/20 0614  GLUCAP 130* 126* 153*    Assessment/Plan: S/P Procedure(s) (LRB): CORONARY ARTERY BYPASS GRAFTING  (CABG) TIMES FOUR USING LEFT INTERNAL MAMMARY ARTERY AND RIGHT LEG GREATER SAPHENOUS VEIN HARVESTED ENDOSCOPICALLY Flowtrack only (N/A) TRANSESOPHAGEAL ECHOCARDIOGRAM (TEE) (N/A) CLIPPING OF LEFT ATRIAL APPENDAGE USING ATRICURE ATRICLIP SIZE 40 (Left)  1. CV- Atrial Fibrillation, rate improved, Hypotension improved- continue Amiodarone 400 mg BID, will decrease Coreg to 6.25 mg BID, hopefully he can tolerate this dose with his BP, continue Eliquis, appreciate Cardiology assistance, for attempted cardioversion today 2. Pulm- no acute issues, continue IS 3. Renal- creatinine stable, K is improved at 4.0, will reduce supplementation to 10 meq daily 4. DM-sugars controlled, continue home Metformin 5. Dispo- patient stable, remains in A. Fib with rate in the 110s. BP improved, continue Amiodarone, Coreg if BP allows, on Eliquis for Cardioversion today, continue current care   LOS: 11 days    Lowella Dandy, PA-C 10/26/2020   Doing much better today. A. fib improved, blood pressure improved Sitting up eating [employer] chicken We will plan for cardioversion tomorrow.  Krina Mraz Keane Scrape

## 2020-10-26 NOTE — Progress Notes (Signed)
CARDIAC REHAB PHASE I   PRE:  Rate/Rhythm: 101 Afib  BP:  Sitting: 111/18      SaO2: 95 RA  MODE:  Ambulation: 125 ft x2   POST:  Rate/Rhythm: 117 Afib  BP:  Sitting: 106/58    SaO2: 100 RA  Pt ambulated 134ft in hallway assist of two for safety. Pt took a seated rest break and able to walk another 110ft in hallway back to room. Pt states he feels better today. Able to eat some. Pt returned to recliner. Encouraged continued ambulation and IS use. Will continue to follow.  [id number] Reynold Bowen, RN BSN 10/26/2020 11:48 AM

## 2020-10-26 NOTE — Progress Notes (Signed)
Mobility Specialist - Progress Note   10/26/20 1340  Mobility  Activity Ambulated in hall  Level of Assistance Standby assist, set-up cues, supervision of patient - no hands on  Assistive Device Four wheel walker  Distance Ambulated (ft) 400 ft (200 ft x 2)  Mobility performed by Mobility specialist  $Mobility charge 1 Mobility    Pre-mobility: 100 HR, 108/56 BP During mobility: 110 HR, 100% SpO2 Post-mobility: 109 HR, 136/87 BP, 100% SpO2  Pt required one seated rest break due to feeling out of breath, his SpO2 was 100% at that time. Pt otherwise asx.  Mamie Levers Mobility Specialist Mobility Specialist Phone: [phone]

## 2020-10-26 NOTE — Plan of Care (Signed)
  Problem: Activity: Goal: Risk for activity intolerance will decrease Outcome: Progressing   

## 2020-10-26 NOTE — H&P (View-Only) (Signed)
Progress Note  Patient Name: Herbert Torres Date of Encounter: 10/26/2020  [facility] Cardiologist: Chilton Si, MD   Subjective   No chest pain this morning. Feeling much better today.   Inpatient Medications    Scheduled Meds: . amiodarone  400 mg Oral BID  . apixaban  5 mg Oral BID  . aspirin EC  81 mg Oral Daily  . carvedilol  6.25 mg Oral BID WC  . Chlorhexidine Gluconate Cloth  6 each Topical Daily  . ezetimibe  10 mg Oral Daily  . insulin aspart  0-24 Units Subcutaneous TID AC  . lidocaine  2 patch Transdermal Q24H  . metFORMIN  1,000 mg Oral BID WC  . pantoprazole  40 mg Oral Daily  . potassium chloride  10 mEq Oral Daily  . rosuvastatin  20 mg Oral Daily   Continuous Infusions:  PRN Meds: bisacodyl **OR** bisacodyl, docusate sodium, lidocaine, metoprolol tartrate, ondansetron (ZOFRAN) IV, oxyCODONE, traMADol   Vital Signs    Vitals:   10/25/20 2020 10/25/20 2300 10/26/20 0454 10/26/20 0817  BP: 126/70 117/70 122/84 131/83  Pulse: 100 95 99   Resp: 18 20 20 20   Temp: 98 F (36.7 C) 97.8 F (36.6 C) 98 F (36.7 C) 98.4 F (36.9 C)  TempSrc: Oral Oral Oral Oral  SpO2: 95% 92% 93% 95%  Weight:      Height:        Intake/Output Summary (Last 24 hours) at 10/26/2020 0911 Last data filed at 10/25/2020 2200 Gross per 24 hour  Intake 240 ml  Output 500 ml  Net -260 ml   Last 3 Weights 10/24/2020 10/23/2020 10/22/2020  Weight (lbs) 228 lb 12.8 oz 230 lb 11.2 oz 239 lb 6.7 oz  Weight (kg) 103.783 kg 104.645 kg 108.6 kg      Telemetry    Afib rates in low 100s - Personally Reviewed  ECG    No new tracing  Physical Exam  Pleasant older male sitting up in chair.  GEN: No acute distress.   Neck: No JVD Cardiac: Irreg Irreg, no murmurs, rubs, or gallops.  Respiratory: Clear to auscultation bilaterally. GI: Soft, nontender, non-distended  MS: No edema; No deformity. Neuro:  Nonfocal  Psych: Normal affect   Labs    High Sensitivity  Troponin:   Recent Labs  Lab 10/15/20 0958 10/15/20 1126 10/15/20 1656 10/15/20 1926  TROPONINIHS 266* 338* 423* 458*      Chemistry Recent Labs  Lab 10/23/20 0131 10/25/20 0110 10/26/20 0109  NA 135 133* 132*  K 3.5 3.8 4.0  CL 96* 91* 92*  CO2 28 27 27   GLUCOSE 151* 157* 155*  BUN 29* 32* 32*  CREATININE 1.25* 1.32* 1.38*  CALCIUM 8.0* 8.2* 8.2*  GFRNONAA >60 56* 53*  ANIONGAP 11 15 13      Hematology Recent Labs  Lab 10/21/20 0232 10/22/20 0308 10/23/20 0131  WBC 20.9* 15.2* 11.6*  RBC 4.10* 4.14* 3.84*  HGB 12.7* 12.5* 11.7*  HCT 37.8* 37.7* 34.9*  MCV 92.2 91.1 90.9  MCH 31.0 30.2 30.5  MCHC 33.6 33.2 33.5  RDW 13.2 13.0 13.1  PLT 298 408* 441*    BNPNo results for input(s): BNP, PROBNP in the last 168 hours.   DDimer No results for input(s): DDIMER in the last 168 hours.   Radiology    No results found.   Patient Profile     74 y.o. 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 Persistent Afib: remains in afib this morning, rates in the 90-100s. Plan for cardioversion today possibly at 10am if slot becomes available. Otherwise will give diet and will plan for 11am tomorrow.  -- continue amiodarone 400mg BID, coreg 6.25mg BID, Eliquis 5mg BID  2. CAD s/p CABG with left atrial append clipping:  Progressing well post op. Treated with ASA, statin, BB and zetia  3. HL: on crestor 20mg and Zetia 10mg  4. DM: on metformin 100mg BID  For questions or updates, please contact [facility] Please consult [email or web address] for contact info under    Signed, Lindsay Roberts, NP  10/26/2020, 9:11 AM    Patient seen, examined. Available data reviewed. Agree with findings, assessment, and plan as outlined by Lindsay Roberts, NP.  The patient is independently interviewed and examined.  Telemetry is reviewed and he  remains in atrial fibrillation with a heart rate of about 100 bpm.  He looks much better today and his blood pressure is improved.  The patient is alert, oriented, in no distress.  JVP is normal, lungs are clear, heart is irregularly irregular with no murmur gallop, extremities have no significant edema.  Skin is warm and dry without rash.  Unfortunately, there is no room on the cardioversion schedule today.  The patient is scheduled for elective cardioversion tomorrow at 2 PM.  We will give him a carb modified diet today and make him n.p.o. after midnight for cardioversion tomorrow.  I have again reviewed the risks, indications, and alternatives to elective cardioversion with the patient.  He understands and agrees to proceed.  For now he will continue on amiodarone and carvedilol as well as apixaban.  Rithik Odea, M.D. 10/26/2020 10:59 AM    

## 2020-10-26 NOTE — Progress Notes (Addendum)
Progress Note  Patient Name: Herbert Torres Date of Encounter: 10/26/2020  [facility] Cardiologist: Chilton Si, MD   Subjective   No chest pain this morning. Feeling much better today.   Inpatient Medications    Scheduled Meds: . amiodarone  400 mg Oral BID  . apixaban  5 mg Oral BID  . aspirin EC  81 mg Oral Daily  . carvedilol  6.25 mg Oral BID WC  . Chlorhexidine Gluconate Cloth  6 each Topical Daily  . ezetimibe  10 mg Oral Daily  . insulin aspart  0-24 Units Subcutaneous TID AC  . lidocaine  2 patch Transdermal Q24H  . metFORMIN  1,000 mg Oral BID WC  . pantoprazole  40 mg Oral Daily  . potassium chloride  10 mEq Oral Daily  . rosuvastatin  20 mg Oral Daily   Continuous Infusions:  PRN Meds: bisacodyl **OR** bisacodyl, docusate sodium, lidocaine, metoprolol tartrate, ondansetron (ZOFRAN) IV, oxyCODONE, traMADol   Vital Signs    Vitals:   10/25/20 2020 10/25/20 2300 10/26/20 0454 10/26/20 0817  BP: 126/70 117/70 122/84 131/83  Pulse: 100 95 99   Resp: 18 20 20 20   Temp: 98 F (36.7 C) 97.8 F (36.6 C) 98 F (36.7 C) 98.4 F (36.9 C)  TempSrc: Oral Oral Oral Oral  SpO2: 95% 92% 93% 95%  Weight:      Height:        Intake/Output Summary (Last 24 hours) at 10/26/2020 0911 Last data filed at 10/25/2020 2200 Gross per 24 hour  Intake 240 ml  Output 500 ml  Net -260 ml   Last 3 Weights 10/24/2020 10/23/2020 10/22/2020  Weight (lbs) 228 lb 12.8 oz 230 lb 11.2 oz 239 lb 6.7 oz  Weight (kg) 103.783 kg 104.645 kg 108.6 kg      Telemetry    Afib rates in low 100s - Personally Reviewed  ECG    No new tracing  Physical Exam  Pleasant older male sitting up in chair.  GEN: No acute distress.   Neck: No JVD Cardiac: Irreg Irreg, no murmurs, rubs, or gallops.  Respiratory: Clear to auscultation bilaterally. GI: Soft, nontender, non-distended  MS: No edema; No deformity. Neuro:  Nonfocal  Psych: Normal affect   Labs    High Sensitivity  Troponin:   Recent Labs  Lab 10/15/20 0958 10/15/20 1126 10/15/20 1656 10/15/20 1926  TROPONINIHS 266* 338* 423* 458*      Chemistry Recent Labs  Lab 10/23/20 0131 10/25/20 0110 10/26/20 0109  NA 135 133* 132*  K 3.5 3.8 4.0  CL 96* 91* 92*  CO2 28 27 27   GLUCOSE 151* 157* 155*  BUN 29* 32* 32*  CREATININE 1.25* 1.32* 1.38*  CALCIUM 8.0* 8.2* 8.2*  GFRNONAA >60 56* 53*  ANIONGAP 11 15 13      Hematology Recent Labs  Lab 10/21/20 0232 10/22/20 0308 10/23/20 0131  WBC 20.9* 15.2* 11.6*  RBC 4.10* 4.14* 3.84*  HGB 12.7* 12.5* 11.7*  HCT 37.8* 37.7* 34.9*  MCV 92.2 91.1 90.9  MCH 31.0 30.2 30.5  MCHC 33.6 33.2 33.5  RDW 13.2 13.0 13.1  PLT 298 408* 441*    BNPNo results for input(s): BNP, PROBNP in the last 168 hours.   DDimer No results for input(s): DDIMER in the last 168 hours.   Radiology    No results found.   Patient Profile     74 y.o. 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 Persistent Afib: remains in afib this morning, rates in the 90-100s. Plan for cardioversion today possibly at 10am if slot becomes available. Otherwise will give diet and will plan for 11am tomorrow.  -- continue amiodarone 400mg  BID, coreg 6.25mg  BID, Eliquis 5mg  BID  2. CAD s/p CABG with left atrial append clipping:  Progressing well post op. Treated with ASA, statin, BB and zetia  3. HL: on crestor 20mg  and Zetia 10mg   4. DM: on metformin 100mg  BID  For questions or updates, please contact [facility] Please consult [email or web address] for contact info under    Signed, , NP  10/26/2020, 9:11 AM    Patient seen, examined. Available data reviewed. Agree with findings, assessment, and plan as outlined by , NP.  The patient is independently interviewed and examined.  Telemetry is reviewed and he  remains in atrial fibrillation with a heart rate of about 100 bpm.  He looks much better today and his blood pressure is improved.  The patient is alert, oriented, in no distress.  JVP is normal, lungs are clear, heart is irregularly irregular with no murmur gallop, extremities have no significant edema.  Skin is warm and dry without rash.  Unfortunately, there is no room on the cardioversion schedule today.  The patient is scheduled for elective cardioversion tomorrow at 2 PM.  We will give him a carb modified diet today and make him n.p.o. after midnight for cardioversion tomorrow.  I have again reviewed the risks, indications, and alternatives to elective cardioversion with the patient.  He understands and agrees to proceed.  For now he will continue on amiodarone and carvedilol as well as apixaban.  , M.D. 10/26/2020 10:59 AM

## 2020-10-27 ENCOUNTER — Inpatient Hospital Stay (HOSPITAL_COMMUNITY): Payer: Medicare Other | Admitting: Anesthesiology

## 2020-10-27 ENCOUNTER — Encounter (HOSPITAL_COMMUNITY): Payer: Self-pay | Admitting: Thoracic Surgery (Cardiothoracic Vascular Surgery)

## 2020-10-27 ENCOUNTER — Encounter (HOSPITAL_COMMUNITY)
Admission: EM | Disposition: A | Discharge status: Home Health | Payer: Self-pay | Source: Home / Self Care | Attending: Thoracic Surgery (Cardiothoracic Vascular Surgery)

## 2020-10-27 DIAGNOSIS — I4891 Unspecified atrial fibrillation: Secondary | ICD-10-CM

## 2020-10-27 DIAGNOSIS — I4819 Other persistent atrial fibrillation: Secondary | ICD-10-CM

## 2020-10-27 HISTORY — PX: CARDIOVERSION: SHX1299

## 2020-10-27 LAB — BASIC METABOLIC PANEL
Anion gap: 14 (ref 5–15)
BUN: 29 mg/dL — ABNORMAL HIGH (ref 8–23)
CO2: 25 mmol/L (ref 22–32)
Calcium: 8.4 mg/dL — ABNORMAL LOW (ref 8.9–10.3)
Chloride: 93 mmol/L — ABNORMAL LOW (ref 98–111)
Creatinine, Ser: 1.28 mg/dL — ABNORMAL HIGH (ref 0.61–1.24)
GFR, Estimated: 58 mL/min — ABNORMAL LOW (ref >60)
Glucose, Bld: 157 mg/dL — ABNORMAL HIGH (ref 70–99)
Potassium: 4 mmol/L (ref 3.5–5.1)
Sodium: 132 mmol/L — ABNORMAL LOW (ref 135–145)

## 2020-10-27 LAB — GLUCOSE, CAPILLARY
Glucose-Capillary: 169 mg/dL — ABNORMAL HIGH (ref 70–99)
Glucose-Capillary: 120 mg/dL — ABNORMAL HIGH (ref 70–99)
Glucose-Capillary: 170 mg/dL — ABNORMAL HIGH (ref 70–99)
Glucose-Capillary: 171 mg/dL — ABNORMAL HIGH (ref 70–99)

## 2020-10-27 SURGERY — CARDIOVERSION
Anesthesia: General

## 2020-10-27 MED ORDER — DIGOXIN 0.25 MG/ML IJ SOLN
0.5000 mg | Freq: Once | INTRAMUSCULAR | Status: AC
  Administered 2020-10-27: 0.5 mg via INTRAVENOUS
  Filled 2020-10-27: qty 2

## 2020-10-27 MED ORDER — DIPHENOXYLATE-ATROPINE 2.5-0.025 MG PO TABS: 1.0000 | ORAL_TABLET | Freq: Four times a day (QID) | ORAL | Status: DC | PRN

## 2020-10-27 MED ORDER — LIDOCAINE 2% (20 MG/ML) 5 ML SYRINGE
INTRAMUSCULAR | Status: DC | PRN
  Administered 2020-10-27: 40 mg via INTRAVENOUS

## 2020-10-27 MED ORDER — SODIUM CHLORIDE 0.9 % IV SOLN: INTRAVENOUS | Status: DC | PRN

## 2020-10-27 MED ORDER — PROPOFOL 10 MG/ML IV BOLUS
INTRAVENOUS | Status: DC | PRN
  Administered 2020-10-27: 60 mg via INTRAVENOUS

## 2020-10-27 MED ORDER — DIGOXIN 125 MCG PO TABS
0.2500 mg | ORAL_TABLET | Freq: Every day | ORAL | Status: DC
  Administered 2020-10-28 – 2020-10-29 (×2): 0.25 mg via ORAL
  Filled 2020-10-27 (×2): qty 2

## 2020-10-27 NOTE — Transfer of Care (Signed)
Immediate Anesthesia Transfer of Care Note  Patient: Herbert Torres  Procedure(s) Performed: CARDIOVERSION (N/A )  Patient Location: Endoscopy Unit  Anesthesia Type:General  Level of Consciousness: drowsy  Airway & Oxygen Therapy: Patient Spontanous Breathing and Patient connected to nasal cannula oxygen  Post-op Assessment: Report given to RN and Post -op Vital signs reviewed and stable  Post vital signs: Reviewed and stable  Last Vitals:  Vitals Value Taken Time  BP    Temp    Pulse    Resp    SpO2      Last Pain:  Vitals:   10/27/20 1001  TempSrc: Tympanic  PainSc: 0-No pain      Patients Stated Pain Goal: 1 (10/19/20 2000)  Complications: No complications documented.

## 2020-10-27 NOTE — Progress Notes (Signed)
Physical Therapy Treatment Patient Details Name: Herbert Torres MRN: [id number] DOB: July 23, 1946 Today's Date: 10/27/2020    History of Present Illness 74 y.o. male with paroxysmal atrial fibrillation, prior tobacco abuse (quit 30 years ago), diabetes, hypertension, and hyperlipidemia Presented to [facility] 10/15/20 EKG had no ischemic changes other than very mild lateral ST depression.  High-sensitivity troponin was elevated to 266.  Admitted with NSTEMI started on IV heparin and transferred to [facility] for cardiac catheterization.s/p 10/27 CORONARY ARTERY BYPASS GRAFTING (CABG) TIMES FOUR USING LEFT INTERNAL MAMMARY ARTERY AND RIGHT LEG GREATER SAPHENOUS VEIN HARVESTED ENDOSCOPICALLY. Pt experiencing symptomatic orthostatic hypotension post sx.     PT Comments    Pt in bed on entry reluctantly agreeable to therapy today. Pt making good progress towards his goals but is obviously frustrated by his decline in mobility. Pt continues to require reminders for adherence to sternal precautions. Pt is min guard for bed mobility, transfers and ambulation of 125 feet before requiring seated rest break. Pt request to ambulate back to room to use bathroom. Pt min guard for transfer and ambulation back to chair. D/c plans remain appropriate. PT will continue to follow acutely.   Follow Up Recommendations  No PT follow up;Supervision for mobility/OOB     Equipment Recommendations   (Rollator)       Precautions / Restrictions Precautions Precautions: Sternal Precaution Comments: continue to remind pt of sternal precautions Restrictions Weight Bearing Restrictions: Yes Other Position/Activity Restrictions: sternal precautions    Mobility  Bed Mobility Overal bed mobility: Needs Assistance Bed Mobility: Supine to Sit     Supine to sit: Min guard;HOB elevated     General bed mobility comments: min guard for safety, needs reminder of sternal precaution   Transfers Overall transfer level:  Needs assistance Equipment used: 4-wheeled walker;None Transfers: Sit to/from Stand Sit to Stand: Min guard         General transfer comment: min guard for standing from bed, requires vc for remembering to lock down Rollator before sitting and not to unlock it until standing   Ambulation/Gait Ambulation/Gait assistance: Min guard Gait Distance (Feet): 250 Feet Assistive device: Rolling walker (2 wheeled) Gait Pattern/deviations: Step-through pattern;Decreased step length - right;Decreased step length - left Gait velocity: slowed  Gait velocity interpretation: <1.31 ft/sec, indicative of household ambulator General Gait Details: min guard for safety with Rollator       Balance Overall balance assessment: Mild deficits observed, not formally tested                                          Cognition Arousal/Alertness: Awake/alert Behavior During Therapy: WFL for tasks assessed/performed Overall Cognitive Status: Within Functional Limits for tasks assessed                                           General Comments General comments (skin integrity, edema, etc.): VSS on RA, pt with 4/4 DoE with ambulation SaO2 on RA 98%O2      Pertinent Vitals/Pain Pain Assessment: Faces     PT Goals (current goals can now be found in the care plan section) Acute Rehab PT Goals Patient Stated Goal: feel better PT Goal Formulation: With patient Time For Goal Achievement: 11/08/20 Potential to Achieve Goals: Good Progress towards PT goals:  Progressing toward goals    Frequency    Min 3X/week      PT Plan Current plan remains appropriate;Equipment recommendations need to be updated       AM-PAC PT "6 Clicks" Mobility   Outcome Measure  Help needed turning from your back to your side while in a flat bed without using bedrails?: None Help needed moving from lying on your back to sitting on the side of a flat bed without using bedrails?: A  Little Help needed moving to and from a bed to a chair (including a wheelchair)?: A Little Help needed standing up from a chair using your arms (e.g., wheelchair or bedside chair)?: A Little Help needed to walk in hospital room?: A Little Help needed climbing 3-5 steps with a railing? : A Little 6 Click Score: 19    End of Session Equipment Utilized During Treatment: Gait belt Activity Tolerance: Patient tolerated treatment well Patient left: in chair;with call bell/phone within reach Nurse Communication: Mobility status PT Visit Diagnosis: Muscle weakness (generalized) (M62.81)     Time: 1610-9604 PT Time Calculation (min) (ACUTE ONLY): 18 min  Charges:  $Gait Training: 8-22 mins                     Mercedees Convery B. Beverely Risen PT, DPT Acute Rehabilitation Services Pager [phone] Office [phone]    Herbert Torres 10/27/2020, 3:53 PM

## 2020-10-27 NOTE — Plan of Care (Signed)
  Problem: Activity: Goal: Risk for activity intolerance will decrease Outcome: Progressing   

## 2020-10-27 NOTE — Progress Notes (Addendum)
Heart Failure Stewardship Pharmacist Progress Note   PCP: [facility] PCP-Cardiologist: Chilton Si, MD    HPI:  74 yo M with PMH of afib and diabetes. 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 ECHO done on 10/16/20 and LVEF is 50-55%.  Current HF Medications: Carvedilol 6.25 mg BID Digoxin 0.5 mg IV x 1, followed by 0.25 mg PO daily  Prior to admission HF Medications: Carvedilol 12.5 mg BID Lisinopril 40 mg daily Hydralazine 25 mg TID  Pertinent Lab Values: . Serum creatinine 1.28, BUN 29, Potassium 4.0, Sodium 132, BNP 154, Digoxin (level following doses to be given starting 11/5)  Vital Signs: . Weight: 223 lbs (admission weight: 239 lbs) . Blood pressure: 120/70s  . Heart rate: 110s   Medication Assistance / Insurance Benefits Check: Does the patient have prescription insurance?  Yes Type of insurance plan: AARP Medicare  Does the patient qualify for medication assistance through manufacturers or grants?   Yes . Eligible grants and/or patient assistance programs: Eliquis, Clifton Custard (pending final medication plan) . Medication assistance applications in progress: Eliquis, Clifton Custard (in preparation for final medication plan) . Medication assistance applications approved: none Approved medication assistance renewals will be completed by: Dr. Leonides Sake office  Outpatient Pharmacy:  Prior to admission outpatient pharmacy: [facility] Is the patient willing to use [facility] pharmacy at discharge? Yes - requested by daughter Is the patient willing to transition their outpatient pharmacy to utilize a [facility] outpatient pharmacy?   No - prefer to use [facility] pharmacy for now    Assessment: 1. Chronic diastolic CHF (EF 00-17%). NYHA class II symptoms. - Continue carvedilol 6.25 mg BID - Consider adding Entresto 24/26 mg BID for additional HF benefit - now FDA  approved for LVEF of 57% and less - Consider adding Farxiga 10 mg daily  - Continue digoxin 0.5 mg IV x 1, followed by 0.25 mg PO daily - follow up levels as indicated   Plan: 1) Medication changes recommended at this time: - Add Farxiga 10 mg daily  2) Patient assistance application(s): - Copay for Eliquis, Entresto, and Farxiga all >$100 per month on insurance - Patient qualifies for manufacturers assistance for all 3 meds - Will begin patient assistance applications for each and will submit pending final medication plans - discussed with cardiology  3)  Education  - To be completed prior to discharge  Sharen Hones, PharmD, BCPS Heart Failure Engineer, building services Phone [phone]

## 2020-10-27 NOTE — Progress Notes (Signed)
Mobility Specialist: Progress Note   10/27/20 1607  Mobility  Activity Ambulated in hall  Level of Assistance Contact guard assist, steadying assist  Assistive Device Front wheel walker  Distance Ambulated (ft) 272 ft  Mobility Response Tolerated well  Mobility performed by Mobility specialist  Bed Position Chair  $Mobility charge 1 Mobility   Pre-Mobility: 117 HR, 119/71 BP, 95% SpO2 Post-Mobility: 112 HR, 138/81 BP, 99% SpO2  Pt c/o feeling slightly SOB during ambulation. Pt said he was fine to continue ambulation. Pt had no c/o of CP, dizziness, or feeling light headed. Encouraged pt to continue walking with RN later.   [facility] Maesyn Frisinger Mobility Specialist

## 2020-10-27 NOTE — Anesthesia Preprocedure Evaluation (Signed)
Anesthesia Evaluation  Patient identified by MRN, date of birth, ID band Patient awake    Reviewed: Allergy & Precautions, H&P , NPO status , Patient's Chart, lab work & pertinent test results  Airway Mallampati: II   Neck ROM: full    Dental   Pulmonary former smoker,    breath sounds clear to auscultation       Cardiovascular hypertension, + CAD, + Past MI and + CABG  + dysrhythmias Atrial Fibrillation  Rhythm:irregular Rate:Normal     Neuro/Psych    GI/Hepatic GERD  ,  Endo/Other  diabetes, Type 2  Renal/GU      Musculoskeletal   Abdominal   Peds  Hematology   Anesthesia Other Findings   Reproductive/Obstetrics                             Anesthesia Physical Anesthesia Plan  ASA: III  Anesthesia Plan: General   Post-op Pain Management:    Induction: Intravenous  PONV Risk Score and Plan: 2 and Propofol infusion and Treatment may vary due to age or medical condition  Airway Management Planned: Mask  Additional Equipment:   Intra-op Plan:   Post-operative Plan:   Informed Consent: I have reviewed the patients History and Physical, chart, labs and discussed the procedure including the risks, benefits and alternatives for the proposed anesthesia with the patient or authorized representative who has indicated his/her understanding and acceptance.       Plan Discussed with: CRNA, Anesthesiologist and Surgeon  Anesthesia Plan Comments:         Anesthesia Quick Evaluation

## 2020-10-27 NOTE — Progress Notes (Signed)
Patient transported to procedure. 

## 2020-10-27 NOTE — Interval H&P Note (Signed)
History and Physical Interval Note:  10/27/2020 10:04 AM  Herbert Torres  has presented today for surgery, with the diagnosis of AFIB.  The various methods of treatment have been discussed with the patient and family. After consideration of risks, benefits and other options for treatment, the patient has consented to  Procedure(s): CARDIOVERSION (N/A) as a surgical intervention.  The patient's history has been reviewed, patient examined, no change in status, stable for surgery.  I have reviewed the patient's chart and labs.  Questions were answered to the patient's satisfaction.     Olga Millers

## 2020-10-27 NOTE — Progress Notes (Signed)
CARDIAC REHAB PHASE I   Offered to walk with pt. Pt states frustrations concerning the timing of his cardioversion, and his inability to eat. Pt states weakness. Stressed importance of ambulation. Pt states willingness to try later today. Will f/u and encourage mobility as able.  [id number] Reynold Bowen, RN BSN 10/27/2020 8:30 AM

## 2020-10-27 NOTE — Procedures (Addendum)
Electrical Cardioversion Procedure Note Herbert Torres [id number] 1946/04/19  Procedure: Electrical Cardioversion Indications:  Atrial Fibrillation  Procedure Details Consent: Risks of procedure as well as the alternatives and risks of each were explained to the (patient/caregiver).  Consent for procedure obtained. Time Out: Verified patient identification, verified procedure, site/side was marked, verified correct patient position, special equipment/implants available, medications/allergies/relevent history reviewed, required imaging and test results available.  Performed  Patient placed on cardiac monitor, pulse oximetry, supplemental oxygen as necessary.  Sedation given: Pt sedated by anesthesia with lidocaine 40 mg and dioprovan 60 mg IV. Pacer pads placed anterior and posterior chest.  Cardioverted 1 time(s).  Cardioverted at 200J. This resulted in sinus rhythm but pt converted back to atrial fibrillation after approximately one minute; would likely continue amiodarone load and repeat DCCV in 2-3 weeks; hopefully with more amiodarone he will hold sinus.  Evaluation Findings: Post procedure EKG shows: Atrial Fibrillation Complications: None Patient did tolerate procedure well.   Olga Millers 10/27/2020, 10:05 AM

## 2020-10-27 NOTE — Progress Notes (Addendum)
      [address]       [place] [address]             [phone]      9 Days Post-Op Procedure(s) (LRB): CORONARY ARTERY BYPASS GRAFTING (CABG) TIMES FOUR USING LEFT INTERNAL MAMMARY ARTERY AND RIGHT LEG GREATER SAPHENOUS VEIN HARVESTED ENDOSCOPICALLY Flowtrack only (N/A) TRANSESOPHAGEAL ECHOCARDIOGRAM (TEE) (N/A) CLIPPING OF LEFT ATRIAL APPENDAGE USING ATRICURE ATRICLIP SIZE 40 (Left)   Subjective:  Sitting up in chair.  States he has been having diarrhea overnight.  Objective: Vital signs in last 24 hours: Temp:  [97.7 F (36.5 C)-98.8 F (37.1 C)] 98.8 F (37.1 C) (11/05 0640) Pulse Rate:  [94-127] 98 (11/05 0640) Cardiac Rhythm: Atrial fibrillation (11/04 1919) Resp:  [16-22] 20 (11/05 0640) BP: (99-132)/(54-83) 114/73 (11/05 0640) SpO2:  [92 %-95 %] 95 % (11/05 0640) Weight:  [101.2 kg] 101.2 kg (11/05 0500)  Intake/Output from previous day: 11/04 0701 - 11/05 0700 In: 60.5 [I.V.:60.5] Out: 401 [Urine:400; Stool:1]  General appearance: alert, cooperative and no distress Heart: irregularly irregular rhythm Lungs: clear to auscultation bilaterally Abdomen: soft, non-tender; bowel sounds normal; no masses,  no organomegaly Extremities: extremities normal, atraumatic, no cyanosis or edema Wound: clean and dry  Lab Results: No results for input(s): WBC, HGB, HCT, PLT in the last 72 hours. BMET: Recent Labs    10/26/20 0109 10/27/20 0039  NA 132* 132*  K 4.0 4.0  CL 92* 93*  CO2 27 25  GLUCOSE 155* 157*  BUN 32* 29*  CREATININE 1.38* 1.28*  CALCIUM 8.2* 8.4*    PT/INR: No results for input(s): LABPROT, INR in the last 72 hours. ABG    Component Value Date/Time   PHART 7.334 (L) 10/19/2020 0032   HCO3 20.5 10/19/2020 0032   TCO2 22 10/19/2020 0032   ACIDBASEDEF 5.0 (H) 10/19/2020 0032   O2SAT 96.0 10/19/2020 0032   CBG (last 3)  Recent Labs    10/26/20 1629 10/26/20 2114 10/27/20 0617  GLUCAP 126* 206* 169*     Assessment/Plan: S/P Procedure(s) (LRB): CORONARY ARTERY BYPASS GRAFTING (CABG) TIMES FOUR USING LEFT INTERNAL MAMMARY ARTERY AND RIGHT LEG GREATER SAPHENOUS VEIN HARVESTED ENDOSCOPICALLY Flowtrack only (N/A) TRANSESOPHAGEAL ECHOCARDIOGRAM (TEE) (N/A) CLIPPING OF LEFT ATRIAL APPENDAGE USING ATRICURE ATRICLIP SIZE 40 (Left)  1. CV- Atrial Fibrillation, rate in the 110s, Hypotension improved- continue Amiodarone 400 mg BID, Coreg at 6.25 mg BID, Eliquis 5 mg BID, Cardioversion today 2. Pulm- no acute issues, continue IS 3. Renal- creatinine, lytes remains stable, K is at 4.0 4. GI- diarrhea overnight, will stop all stool softeners, will add prn Lomotil 5. DM- sugars remain controlled 6. Dispo- patient stable, for cardioversion today, continue Amiodarone, Coreg, Eliquis, add Lomotil for diarrhea   LOS: 12 days    Lowella Dandy, PA-C 10/27/2020  Agree with above Refractory atrial fibrillation Being loaded with dig. Appreciate cardiology assistance dispo pending better rate control  Maryl Blalock O Kennetta Pavlovic

## 2020-10-27 NOTE — Progress Notes (Addendum)
Progress Note  Patient Name: Herbert Torres Date of Encounter: 10/27/2020  [facility] Cardiologist: Chilton Si, MD   Subjective   Pt states he feels weak this morning, no new symptoms.  Inpatient Medications    Scheduled Meds: . amiodarone  400 mg Oral BID  . apixaban  5 mg Oral BID  . aspirin EC  81 mg Oral Daily  . carvedilol  6.25 mg Oral BID WC  . Chlorhexidine Gluconate Cloth  6 each Topical Daily  . ezetimibe  10 mg Oral Daily  . insulin aspart  0-24 Units Subcutaneous TID AC  . lidocaine  2 patch Transdermal Q24H  . metFORMIN  1,000 mg Oral BID WC  . pantoprazole  40 mg Oral Daily  . potassium chloride  10 mEq Oral Daily  . rosuvastatin  20 mg Oral Daily   Continuous Infusions:  PRN Meds: bisacodyl **OR** bisacodyl, docusate sodium, lidocaine, metoprolol tartrate, ondansetron (ZOFRAN) IV, oxyCODONE, traMADol   Vital Signs    Vitals:   10/26/20 1944 10/26/20 2313 10/27/20 0500 10/27/20 0640  BP: 99/67 120/71  114/73  Pulse: 94 100  98  Resp: 20 19  20   Temp: 98 F (36.7 C) 98.2 F (36.8 C)  98.8 F (37.1 C)  TempSrc: Oral Oral  Oral  SpO2: 94% 92%  95%  Weight:   101.2 kg   Height:        Intake/Output Summary (Last 24 hours) at 10/27/2020 0703 Last data filed at 10/26/2020 2209 Gross per 24 hour  Intake 60.45 ml  Output 401 ml  Net -340.55 ml   Last 3 Weights 10/27/2020 10/24/2020 10/23/2020  Weight (lbs) 223 lb 3.2 oz 228 lb 12.8 oz 230 lb 11.2 oz  Weight (kg) 101.243 kg 103.783 kg 104.645 kg      Telemetry    Afib in the 100-110s - Personally Reviewed  ECG    Atrial flutter ventricular rate 104 - Personally Reviewed  Physical Exam   GEN: No acute distress.   Neck: No JVD Cardiac: irregular rhythm tachycardic rate, sternotomy  Respiratory: Clear to auscultation bilaterally. GI: Soft, nontender, non-distended  MS: No edema; No deformity. Neuro:  Nonfocal  Psych: Normal affect   Labs    High Sensitivity Troponin:     Recent Labs  Lab 10/15/20 0958 10/15/20 1126 10/15/20 1656 10/15/20 1926  TROPONINIHS 266* 338* 423* 458*      Chemistry Recent Labs  Lab 10/25/20 0110 10/26/20 0109 10/27/20 0039  NA 133* 132* 132*  K 3.8 4.0 4.0  CL 91* 92* 93*  CO2 27 27 25   GLUCOSE 157* 155* 157*  BUN 32* 32* 29*  CREATININE 1.32* 1.38* 1.28*  CALCIUM 8.2* 8.2* 8.4*  GFRNONAA 56* 53* 58*  ANIONGAP 15 13 14      Hematology Recent Labs  Lab 10/21/20 0232 10/22/20 0308 10/23/20 0131  WBC 20.9* 15.2* 11.6*  RBC 4.10* 4.14* 3.84*  HGB 12.7* 12.5* 11.7*  HCT 37.8* 37.7* 34.9*  MCV 92.2 91.1 90.9  MCH 31.0 30.2 30.5  MCHC 33.6 33.2 33.5  RDW 13.2 13.0 13.1  PLT 298 408* 441*    BNPNo results for input(s): BNP, PROBNP in the last 168 hours.   DDimer No results for input(s): DDIMER in the last 168 hours.   Radiology    No results found.  Cardiac Studies   DCCV today  Patient Profile     74 y.o. male with PAF and DM presented with a NSTEMI found to have  multivessel CAD. He had CABG 10/18/20 with post-op course complicated by Afib RVR.   Assessment & Plan    Persistent atrial fibrillation with RVR Hypotension - required albumin for pressure support - due to difficult to control RVR, plan for DCCV today - is on PO load of amiodarone and maintained on low dose carvedilol - pt did have LAA at the time of CABG - pt has been anticoagulated since 10/28 - echo with mild to moderate enlargement of left atrium - plan for DCCV this afternoon - continue eliquis 5 mg BID  This patients CHA2DS2-VASc Score and unadjusted Ischemic Stroke Rate (% per year) is equal to 7.2 % stroke rate/year from a score of 5 (2age, HTN, CAD, DM)   CAD s/p CABG with LAA - per CT surgery - is frustrated and did not walk with cardiac rehab this morning - continue ASA   Hyperlipidemia with LDL goal < 70 - continue crestor 20 mg and zetia 10 mg - increase crestor to 40 mg if no contraindications   DM -  metformin 1000 mg BID      For questions or updates, please contact [facility] Please consult [email or web address] for contact info under        Signed, Marcelino Duster, PA  10/27/2020, 7:03 AM    Patient seen, examined. Available data reviewed. Agree with findings, assessment, and plan as outlined by Bettina Gavia, PA-C.  The patient is independently interviewed and examined.  He is alert, oriented, in no distress.  JVP is normal, lungs are clear, heart is irregularly irregular with no murmur gallop, extremities have no edema, abdomen is soft and nontender.  Unfortunately the patient failed cardioversion this morning.  He was in sinus rhythm for about a minute and then went back and atrial fibrillation.  I talked to the patient and his daughter who was at the bedside.  His heart rate remains elevated at rest above 100 bpm.  He otherwise is doing better and certainly is feeling better over the last few days.  I think we should plan on a strategy of rate control for the time being until he is further loaded with amiodarone over the next few weeks.  Will add digoxin today with a single IV loading dose followed by oral dosing tomorrow.  Hopefully with a combination of beta-blockade, amiodarone, and digoxin, his heart rate can be controlled so that he can be discharged from the hospital and return electively for outpatient cardioversion in 3 to 4 weeks.  Plan reviewed with the patient and his daughter.  Our team will follow up over the weekend.  Tonny Bollman, M.D. 10/27/2020 1:09 PM

## 2020-10-28 LAB — GLUCOSE, CAPILLARY
Glucose-Capillary: 169 mg/dL — ABNORMAL HIGH (ref 70–99)
Glucose-Capillary: 133 mg/dL — ABNORMAL HIGH (ref 70–99)
Glucose-Capillary: 213 mg/dL — ABNORMAL HIGH (ref 70–99)
Glucose-Capillary: 90 mg/dL (ref 70–99)

## 2020-10-28 MED ORDER — TRAMADOL HCL 50 MG PO TABS: 50.0000 mg | ORAL_TABLET | ORAL | 0 refills | Status: DC | PRN

## 2020-10-28 MED ORDER — CARVEDILOL 6.25 MG PO TABS
6.2500 mg | ORAL_TABLET | Freq: Two times a day (BID) | ORAL | 3 refills | Status: DC
Start: 2020-10-28 — End: 2021-02-20

## 2020-10-28 MED ORDER — ROSUVASTATIN CALCIUM 20 MG PO TABS
20.0000 mg | ORAL_TABLET | Freq: Every day | ORAL | 3 refills | Status: DC
Start: 2020-10-28 — End: 2021-02-22

## 2020-10-28 MED ORDER — DIGOXIN 250 MCG PO TABS
0.2500 mg | ORAL_TABLET | Freq: Every day | ORAL | 3 refills | Status: DC
Start: 2020-10-28 — End: 2021-02-20

## 2020-10-28 MED ORDER — ASPIRIN 81 MG PO TBEC
81.0000 mg | DELAYED_RELEASE_TABLET | Freq: Every day | ORAL | 11 refills | Status: DC
Start: 2020-10-28 — End: 2024-02-02

## 2020-10-28 MED ORDER — EZETIMIBE 10 MG PO TABS
10.0000 mg | ORAL_TABLET | Freq: Every day | ORAL | 3 refills | Status: DC
Start: 2020-10-28 — End: 2021-01-26

## 2020-10-28 MED ORDER — AMIODARONE HCL 200 MG PO TABS
400.0000 mg | ORAL_TABLET | Freq: Two times a day (BID) | ORAL | 1 refills | Status: DC
Start: 2020-10-28 — End: 2021-06-13

## 2020-10-28 NOTE — Progress Notes (Addendum)
      [address]       [place] [address]             [phone]      1 Day Post-Op Procedure(s) (LRB): CARDIOVERSION (N/A)   Subjective:  Patient continues to feel better.  States his diarrhea is better.  He wants to go home today.  Objective: Vital signs in last 24 hours: Temp:  [97.5 F (36.4 C)-98.1 F (36.7 C)] 98.1 F (36.7 C) (11/06 0403) Pulse Rate:  [98-115] 107 (11/06 0403) Cardiac Rhythm: Atrial fibrillation (11/05 2000) Resp:  [18-99] 20 (11/06 0626) BP: (98-151)/(60-81) 116/75 (11/06 0403) SpO2:  [94 %-98 %] 95 % (11/06 0403) Weight:  [101 kg] 101 kg (11/06 0606)  Intake/Output from previous day: 11/05 0701 - 11/06 0700 In: 50 [I.V.:50] Out: 1050 [Urine:1050]  General appearance: alert, cooperative and no distress Heart: irregularly irregular rhythm Lungs: clear to auscultation bilaterally Abdomen: soft, non-tender; bowel sounds normal; no masses,  no organomegaly Extremities: edema trace Wound: clean and dry  Lab Results: No results for input(s): WBC, HGB, HCT, PLT in the last 72 hours. BMET: Recent Labs    10/26/20 0109 10/27/20 0039  NA 132* 132*  K 4.0 4.0  CL 92* 93*  CO2 27 25  GLUCOSE 155* 157*  BUN 32* 29*  CREATININE 1.38* 1.28*  CALCIUM 8.2* 8.4*    PT/INR: No results for input(s): LABPROT, INR in the last 72 hours. ABG    Component Value Date/Time   PHART 7.334 (L) 10/19/2020 0032   HCO3 20.5 10/19/2020 0032   TCO2 22 10/19/2020 0032   ACIDBASEDEF 5.0 (H) 10/19/2020 0032   O2SAT 96.0 10/19/2020 0032   CBG (last 3)  Recent Labs    10/27/20 1715 10/27/20 2036 10/28/20 0612  GLUCAP 170* 171* 169*    Assessment/Plan: S/P Procedure(s) (LRB): CARDIOVERSION (N/A)  1. CV- Atrial Fibrillation, attempted cardioversion yesterday, got into NSR but only lasted briefly and went right back into A. Fib.. rate in the 90s-110s, BP is improved- continue Amiodarone, Coreg, Digoxin has been initiated, Eliquis 2.  Pulm- no acute issues, continue IS 3. Renal- creatinine remains stable, weight is stable, no need for diuretics 4. GI- diarrhea much improved 5. DM- sugars remain controlled 6. Dispo- patient stable, feeling much better, HR remains controlled, on Amiodarone, Coreg, Digoxin, Eliquis, will have patient ambulate this morning possibly d/c later today vs. tomorrow 4.    LOS: 13 days    Lowella Dandy, PA-C 10/28/2020  afib with rate 90-100, meds have been maximized, if afib rate controlled over next 24 hours plan d/c in am I have seen and examined Herbert Torres and agree with the above assessment  and plan.  Delight Ovens MD Beeper [phone] Office [phone] 10/28/2020 11:51 AM

## 2020-10-28 NOTE — Progress Notes (Signed)
Mobility Specialist: Progress Note   10/28/20 1458  Mobility  Activity Ambulated in hall  Level of Assistance Independent  Assistive Device Front wheel walker  Distance Ambulated (ft) 350 ft  Mobility Response Tolerated well  Mobility performed by Mobility specialist  Bed Position Semi-fowlers  $Mobility charge 1 Mobility   Pre-Mobility: 83 HR, 109/54 BP, 98% SpO2 Post-Mobility: 93 HR, 125/73 BP, 97% SpO2  Pt c/o feeling slightly SOB during ambulation.   [facility] Kimbria Camposano Mobility Specialist

## 2020-10-28 NOTE — Progress Notes (Signed)
Progress Note  Patient Name: Herbert Torres Date of Encounter: 10/28/2020  Primary Cardiologist: Chilton Si, MD  Subjective   Feels better today.  States that he felt good while walking with rehab this morning.  Would like to go home.  Inpatient Medications    Scheduled Meds: . amiodarone  400 mg Oral BID  . apixaban  5 mg Oral BID  . aspirin EC  81 mg Oral Daily  . carvedilol  6.25 mg Oral BID WC  . Chlorhexidine Gluconate Cloth  6 each Topical Daily  . digoxin  0.25 mg Oral Daily  . ezetimibe  10 mg Oral Daily  . insulin aspart  0-24 Units Subcutaneous TID AC  . lidocaine  2 patch Transdermal Q24H  . metFORMIN  1,000 mg Oral BID WC  . pantoprazole  40 mg Oral Daily  . potassium chloride  10 mEq Oral Daily  . rosuvastatin  20 mg Oral Daily    PRN Meds: diphenoxylate-atropine, lidocaine, metoprolol tartrate, ondansetron (ZOFRAN) IV, oxyCODONE, traMADol   Vital Signs    Vitals:   10/28/20 0626 10/28/20 0836 10/28/20 0850 10/28/20 0959  BP:  129/67 127/80   Pulse:  (!) 106    Resp: 20 20 20 20   Temp:  98 F (36.7 C) (!) 96.9 F (36.1 C) 97.6 F (36.4 C)  TempSrc:  Oral Axillary   SpO2:  96%    Weight:      Height:        Intake/Output Summary (Last 24 hours) at 10/28/2020 1124 Last data filed at 10/28/2020 0606 Gross per 24 hour  Intake 0 ml  Output 650 ml  Net -650 ml   Filed Weights   10/24/20 0630 10/27/20 0500 10/28/20 0606  Weight: 103.8 kg 101.2 kg 101 kg    Telemetry    Atrial fibrillation, generally 80s to 90s at rest.  Personally reviewed.  ECG    An ECG dated 10/27/2020 was personally reviewed today and demonstrated:  Probable atypical atrial flutter with variable conduction, nonspecific ST-T changes.  Physical Exam   GEN: No acute distress.   Neck: No JVD. Cardiac:  Irregularly irregular, no gallop.  Respiratory: Nonlabored. Clear to auscultation bilaterally. GI: Soft, nontender, bowel sounds present. MS:  Trace lower leg  edema; No deformity.  Labs    Chemistry Recent Labs  Lab 10/25/20 0110 10/26/20 0109 10/27/20 0039  NA 133* 132* 132*  K 3.8 4.0 4.0  CL 91* 92* 93*  CO2 27 27 25   GLUCOSE 157* 155* 157*  BUN 32* 32* 29*  CREATININE 1.32* 1.38* 1.28*  CALCIUM 8.2* 8.2* 8.4*  GFRNONAA 56* 53* 58*  ANIONGAP 15 13 14      Hematology Recent Labs  Lab 10/22/20 0308 10/23/20 0131  WBC 15.2* 11.6*  RBC 4.14* 3.84*  HGB 12.5* 11.7*  HCT 37.7* 34.9*  MCV 91.1 90.9  MCH 30.2 30.5  MCHC 33.2 33.5  RDW 13.0 13.1  PLT 408* 441*    Cardiac Enzymes Recent Labs  Lab 10/15/20 0958 10/15/20 1126 10/15/20 1656 10/15/20 1926  TROPONINIHS 266* 338* 423* 458*    Radiology    No results found.  Cardiac Studies   Echocardiogram 10/16/2020: 1. Left ventricular ejection fraction, by estimation, is 50 to 55%. The  left ventricle has low normal function. The left ventricle demonstrates  regional wall motion abnormalities (see scoring diagram/findings for  description). Left ventricular diastolic  parameters are consistent with Grade I diastolic dysfunction (impaired  relaxation).  2. Right  ventricular systolic function is normal. The right ventricular  size is mildly enlarged.  3. Left atrial size was mild to moderately dilated.  4. The mitral valve is grossly normal. No evidence of mitral valve  regurgitation.  5. The aortic valve was not well visualized. Aortic valve regurgitation  is not visualized.  6. The inferior vena cava is normal in size with <50% respiratory  variability, suggesting right atrial pressure of 8 mmHg.   Patient Profile     74 y.o. male with a history of PAF, type 2 diabetes mellitus, and recent NSTEMI with documented multivessel CAD.  He is status post CABG on October 27 and has postoperative atrial fibrillation.  Assessment & Plan    1.  Persistent, postoperative atrial fibrillation/atypical atrial flutter.  Cardioversion attempt was unsuccessful and  plan is to continue outpatient oral amiodarone load with repeat attempt in the next few weeks.  CHA2DS2-VASc score is 5 and he is on Eliquis for stroke prophylaxis.  2.  NSTEMI with multivessel CAD status post CABG on October 27 with LIMA to LAD, SVG to OM 3, SVG to OM1, and SVG to first diagonal.  Also underwent clipping of the left atrial appendage.  LVEF 50 to 55%.  3.  Mixed hyperlipidemia, continues on Crestor.  4.  Type 2 diabetes mellitus, on Metformin.  Anticipate discharge home.  Current heart rate control of atrial fibrillation is acceptable for continued outpatient management (picks up with activity, but slows down to the 90s at rest).  Continue amiodarone 400 mg twice daily for now, Eliquis 5 mg twice daily, Coreg 6.25 mg twice daily, and Lanoxin 0.25 mg daily.  He is also on low-dose aspirin, Crestor, and Zetia.  He needs to be scheduled to follow-up with Dr. Duke Salvia or APP in the next 2 weeks to discuss plan for possible follow-up cardioversion attempt if he does not spontaneously convert in the interim.  Agree with adding Farxiga 10 mg daily.  Would not add Entresto at this time particular in light of recent low blood pressures and until atrial fibrillation has been further stabilized.  Signed, Nona Dell, MD  10/28/2020, 11:24 AM

## 2020-10-28 NOTE — Progress Notes (Signed)
CARDIAC REHAB PHASE I   PRE:  Rate/Rhythm: 99 Afib  BP:  Sitting: 127/80      SaO2: 97 RA  MODE:  Ambulation: 300 ft   POST:  Rate/Rhythm: 118 Afib  BP:  Sitting: 145/78    SaO2: 98 RA  Pt ambulated 331ft in hallway standby assist with front wheel walker. Pt took one standing rest break, denies dizziness, pain, or SOB. Pt returned to recliner. Pt educated on importance of site care and monitoring incision daily. Encouraged continued ambulation, IS use, and sternal precautions. Pt given in-the-tube sheet along with heart healthy and diabetic diets. Reviewed restrictions and exercise guidelines. Will refer to CRP II [place].  [id number] Herbert Bowen, RN BSN 10/28/2020 9:09 AM

## 2020-10-29 LAB — GLUCOSE, CAPILLARY: Glucose-Capillary: 130 mg/dL — ABNORMAL HIGH (ref 70–99)

## 2020-10-29 MED ORDER — DAPAGLIFLOZIN PROPANEDIOL 10 MG PO TABS: 10.0000 mg | ORAL_TABLET | Freq: Every day | ORAL | 3 refills | Status: DC

## 2020-10-29 NOTE — TOC Transition Note (Signed)
Transition of Care (TOC) - CM/SW Discharge Note Donn Pierini RN, BSN Transitions of Care Unit 4E- RN Case Manager See Treatment Team for direct phone #    Patient Details  Name: BLAKELEY MARGRAF MRN: [id number] Date of Birth: 1946/10/02  Transition of Care [facility]) CM/SW Contact:  Darrold Span, RN Phone Number: 10/29/2020, 10:12 AM   Clinical Narrative:    Pt stable for transition home, DME has been ordered and should have been delivered to the room by [employer] for home.  [facility] has pre-op referral for any HH needs and will f/u with pt post discharge. Pt to stay with son for awhile- address in previous CM note.    Final next level of care: Home w Home Health Services Barriers to Discharge: Barriers Resolved   Patient Goals and CMS Choice Patient states their goals for this hospitalization and ongoing recovery are:: return home with son CMS Medicare.gov Compare Post Acute Care list provided to:: Patient Choice offered to / list presented to : Patient  Discharge Placement               Home with [facility]        Discharge Plan and Services   Discharge Planning Services: CM Consult Post Acute Care Choice: Home Health, Durable Medical Equipment          DME Arranged: 3-N-1 DME Agency: [facility] Date DME Agency Contacted: 10/27/20 Time DME Agency Contacted: 1500 Representative spoke with at DME Agency: [facility] Agency: [facility] Date [facility] Agency Contacted: 10/26/20 Time HH Agency Contacted: 1200 Representative spoke with at [facility] Agency: [facility]  Social Determinants of Health (SDOH) Interventions     Readmission Risk Interventions Readmission Risk Prevention Plan 10/29/2020  Transportation Screening Complete  PCP or Specialist Appt within 5-7 Days Complete  Home Care Screening Complete  Medication Review (RN CM) Complete  Some recent data might be hidden

## 2020-10-29 NOTE — Progress Notes (Signed)
      [address]       [place] [address]             [phone]      2 Days Post-Op Procedure(s) (LRB): CARDIOVERSION (N/A)   Subjective:  No new complaint. Sitting up in chair and is ready to go home.  No further diarrhea.  Objective: Vital signs in last 24 hours: Temp:  [96.7 F (35.9 C)-98 F (36.7 C)] 97.8 F (36.6 C) (11/07 0732) Pulse Rate:  [93-106] 99 (11/07 0732) Cardiac Rhythm: Atrial fibrillation (11/06 1900) Resp:  [18-24] 20 (11/07 0732) BP: (113-156)/(56-89) 115/56 (11/07 0732) SpO2:  [95 %-99 %] 96 % (11/07 0732) Weight:  [101.8 kg] 101.8 kg (11/07 0616)  Intake/Output from previous day: 11/06 0701 - 11/07 0700 In: -  Out: 626 [Urine:625; Stool:1]  General appearance: alert, cooperative and no distress Heart: irregularly irregular rhythm Lungs: clear to auscultation bilaterally Abdomen: soft, non-tender; bowel sounds normal; no masses,  no organomegaly Extremities: edema none present Wound: clean and dry  Lab Results: No results for input(s): WBC, HGB, HCT, PLT in the last 72 hours. BMET: Recent Labs    10/27/20 0039  NA 132*  K 4.0  CL 93*  CO2 25  GLUCOSE 157*  BUN 29*  CREATININE 1.28*  CALCIUM 8.4*    PT/INR: No results for input(s): LABPROT, INR in the last 72 hours. ABG    Component Value Date/Time   PHART 7.334 (L) 10/19/2020 0032   HCO3 20.5 10/19/2020 0032   TCO2 22 10/19/2020 0032   ACIDBASEDEF 5.0 (H) 10/19/2020 0032   O2SAT 96.0 10/19/2020 0032   CBG (last 3)  Recent Labs    10/28/20 1657 10/28/20 2037 10/29/20 0614  GLUCAP 213* 90 130*    Assessment/Plan: S/P Procedure(s) (LRB): CARDIOVERSION (N/A)  1. CV- Atrial Fibrillation, rate is controlled- continue Amiodarone, Coreg, Digoxin, and Eliquis 2. Pulm- no acute issues, continue IS 3. Renal-weight remains stable, no diuretics indicated at this time 4. DM- sugars are mostly controlled, isolated high level above 200 at times, usually due to  outside food being brought in, will resume home diabetic medications 5. Dispo- patient stable, ambulated in the hallways yesterday without significant difficulty, will d/c home today   LOS: 14 days    Lowella Dandy, PA-C 10/29/2020

## 2020-10-29 NOTE — Progress Notes (Signed)
Discharge instructions provided to patient. All medications, discharge instructions, and follow up appointments discussed. IV out. Monitor off CCMD notified. Discharging home with daughter.  Sabra Heck, RN

## 2020-10-30 ENCOUNTER — Telehealth (HOSPITAL_COMMUNITY): Payer: Self-pay

## 2020-10-30 ENCOUNTER — Encounter (HOSPITAL_COMMUNITY): Payer: Self-pay | Admitting: Cardiology

## 2020-10-30 DIAGNOSIS — Z48812 Encounter for surgical aftercare following surgery on the circulatory system: Secondary | ICD-10-CM

## 2020-10-30 NOTE — Anesthesia Postprocedure Evaluation (Signed)
Anesthesia Post Note  Patient: Herbert Torres  Procedure(s) Performed: CARDIOVERSION (N/A )     Patient location during evaluation: Endoscopy Anesthesia Type: General Level of consciousness: awake and alert Pain management: pain level controlled Vital Signs Assessment: post-procedure vital signs reviewed and stable Respiratory status: spontaneous breathing, nonlabored ventilation, respiratory function stable and patient connected to nasal cannula oxygen Cardiovascular status: blood pressure returned to baseline and stable Postop Assessment: no apparent nausea or vomiting Anesthetic complications: no   No complications documented.  Last Vitals:  Vitals:   10/29/20 0342 10/29/20 0732  BP: (!) 156/86 (!) 115/56  Pulse: 98 99  Resp: (!) 24 20  Temp: 36.4 C 36.6 C  SpO2: 96% 96%    Last Pain:  Vitals:   10/29/20 0851  TempSrc:   PainSc: 0-No pain                 Makhayla Mcmurry S

## 2020-10-30 NOTE — Telephone Encounter (Signed)
Heart Failure Patient Advocate Encounter  [place] sent in an effort to reduce the patient's out of pocket expense for Farxiga to $0.     Application completed and faxed to [phone]   [place] patient assistance phone number for follow up is [phone].   Completed application for [employer] Patient Assistance Foundation sent in an effort to reduce the patient's out of pocket expense for Eliquis to $0.     Application completed and faxed to [phone]  [employer] patient assistance phone number for follow up is [phone]    Sharen Hones, PharmD, BCPS Heart Failure Stewardship Pharmacist Phone [phone]

## 2020-11-02 ENCOUNTER — Encounter: Payer: Self-pay | Admitting: Thoracic Surgery (Cardiothoracic Vascular Surgery)

## 2020-11-02 ENCOUNTER — Ambulatory Visit (INDEPENDENT_AMBULATORY_CARE_PROVIDER_SITE_OTHER): Payer: Self-pay | Admitting: Thoracic Surgery (Cardiothoracic Vascular Surgery)

## 2020-11-02 ENCOUNTER — Other Ambulatory Visit: Payer: Self-pay

## 2020-11-02 VITALS — BP 124/67 | HR 94 | Resp 18 | Ht 73.0 in | Wt 232.0 lb

## 2020-11-02 DIAGNOSIS — Z951 Presence of aortocoronary bypass graft: Secondary | ICD-10-CM

## 2020-11-02 NOTE — Progress Notes (Signed)
      [address]       [place] [address]             [phone]        Herbert Torres [facility] Medical Record #[id number] Date of Birth: [birth year]  Referring: Chilton Si, MD Primary Care: [facility] Primary Cardiologist:Tiffany Duke Salvia, MD  Reason for visit:   follow-up  History of Present Illness:     Herbert Torres comes in for his 1 week appointment.  Overall he is doing well.  He is ambulating a fair amount while at home.  He denies any chest pain or shortness of breath.  Physical Exam: BP 124/67 (BP Location: Right Arm, Patient Position: Sitting)   Pulse 94   Resp 18   Ht 6\' 1"  (1.854 m)   Wt 232 lb (105.2 kg) Comment: with clothes and jacket on  SpO2 97% Comment: RA with mask on  BMI 30.61 kg/m   Alert NAD Irregular rhythm. Incision clean.  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  His hospitalization was complicated by A. fib with RVR and was status post cardioversion.  He was unable to regain a sinus rhythm but was placed on digoxin and has had better rate control.  He is currently on Eliquis and an atrial clip was performed at the time of the surgery.  We will follow back up with him in 1 month with a chest x-ray.   61 11/02/2020 2:30 PM

## 2020-11-03 ENCOUNTER — Telehealth: Payer: Self-pay | Admitting: Cardiovascular Disease

## 2020-11-03 NOTE — Telephone Encounter (Signed)
Patient's daughter, Marcelino Duster, called in to reschedule appointment from 11/17 to 11/24. Marcelino Duster wanted to make sure that this would be okay with Dr. Duke Salvia as the patient just had surgery. Please call/advise if the patient needs to be seen sooner that 11/15/20.   Thank you!

## 2020-11-05 NOTE — Telephone Encounter (Signed)
That's no problem

## 2020-11-06 NOTE — Telephone Encounter (Signed)
Advised daughter 

## 2020-11-07 NOTE — Telephone Encounter (Signed)
Heart Failure Patient Advocate Encounter  Contacted [place] to check on status of Farxiga application.   Was placed on hold for >1 hour and then was disconnected. Will try to call back again at a later time.  Contacted [employer] to check on status of Eliquis application.    Application is in review but required to re-fax the provider sheet to include collaborating physician since prescriber on application was a PA.   Re-sent this page and will follow up on status after this is reviewed.   Sharen Hones, PharmD, BCPS Heart Failure Stewardship Pharmacist Phone [phone]

## 2020-11-08 ENCOUNTER — Ambulatory Visit: Payer: Medicare Other | Admitting: Student

## 2020-11-08 NOTE — Telephone Encounter (Signed)
Patient has been approved to receive Farxiga for $0 from 11/08/20 through 12/22/21.

## 2020-11-09 NOTE — Progress Notes (Signed)
Cardiology Clinic Note   Patient Name: Herbert Torres Date of Encounter: 11/15/2020  Primary Care Provider:  [facility] Primary Cardiologist:  Chilton Si, MD  Patient Profile    Herbert Torres 74 year old male presents to the clinic today for follow-up evaluation status post CABG x4(LIMA to LAD, RSVG to Diagonal, RSVG to OM 1, and RSVG to OM 3) , and left atrial appendage clipping.  Past Medical History    Past Medical History:  Diagnosis Date  . Coronary artery disease   . Diabetes mellitus without complication (HCC)   . GERD (gastroesophageal reflux disease)   . HOH (hard of hearing)    AIDS  . Hyperlipidemia   . Hypertension    Past Surgical History:  Procedure Laterality Date  . APPENDECTOMY    . CARDIOVERSION N/A 10/27/2020   Procedure: CARDIOVERSION;  Surgeon: Lewayne Bunting, MD;  Location: [facility] ENDOSCOPY;  Service: Cardiovascular;  Laterality: N/A;  . CATARACT EXTRACTION W/PHACO Right 02/26/2018   Procedure: CATARACT EXTRACTION PHACO AND INTRAOCULAR LENS PLACEMENT (IOC);  Surgeon: Nevada Crane, MD;  Location: [facility] ORS;  Service: Ophthalmology;  Laterality: Right;  fluid pak loT# [id number] H  exp10/31/2020 [place]   00:38.9 AP%   16.3 CDE   6.33  . CLIPPING OF ATRIAL APPENDAGE Left 10/18/2020   Procedure: CLIPPING OF LEFT ATRIAL APPENDAGE USING ATRICURE ATRICLIP SIZE 40;  Surgeon: Corliss Skains, MD;  Location: [facility] OR;  Service: Open Heart Surgery;  Laterality: Left;  . COLONOSCOPY    . CORONARY ARTERY BYPASS GRAFT N/A 10/18/2020   Procedure: CORONARY ARTERY BYPASS GRAFTING (CABG) TIMES FOUR USING LEFT INTERNAL MAMMARY ARTERY AND RIGHT LEG GREATER SAPHENOUS VEIN HARVESTED ENDOSCOPICALLY Flowtrack only;  Surgeon: Corliss Skains, MD;  Location: [facility] OR;  Service: Open Heart Surgery;  Laterality: N/A;  . IR THORACENTESIS ASP PLEURAL SPACE W/IMG GUIDE  10/23/2020  . LEFT HEART CATH AND CORONARY ANGIOGRAPHY N/A 10/16/2020   Procedure:  LEFT HEART CATH AND CORONARY ANGIOGRAPHY;  Surgeon: Marykay Lex, MD;  Location: [facility] INVASIVE CV LAB;  Service: Cardiovascular;  Laterality: N/A;  . TEE WITHOUT CARDIOVERSION N/A 10/18/2020   Procedure: TRANSESOPHAGEAL ECHOCARDIOGRAM (TEE);  Surgeon: Corliss Skains, MD;  Location: [facility] OR;  Service: Open Heart Surgery;  Laterality: N/A;    Allergies  Allergies  Allergen Reactions  . Atorvastatin Other (See Comments)    C/o myalgias in knees    History of Present Illness    Herbert Torres has a past medical history of atrial fibrillation on Eliquis, DM, HTN, HLD, and previous tobacco use.  He presented to [facility] with complaints of chest pain.  He indicated that the pain had been present off and on for 2-3 weeks.  He described the pain as heaviness with radiation across his chest.  His pain was exacerbated with increased physical activity and bending over.  He denied associated shortness of breath, nausea, and diaphoresis.  His EKG in the ED showed no ischemic changes however, did show some mild ST depression in lateral leads.  His troponins were elevated at 266.  He was ruled in for NSTEMI and placed on IV heparin and transferred to [facility] for further evaluation.  When he arrived at [facility] he was chest pain-free.  He was admitted to cardiology service.  A repeat troponin showed elevation/peak at 458.  He had previously undergone stress testing in July which showed preserved EF and no evidence of ischemia.  It was felt that he  should undergo cardiac catheterization and it was performed 10/16/2020.  His LHC showed multivessel CAD.  CVTS was consulted.  He underwent CABG x4 on 10/18/2020 by Dr. Cliffton Asters (LIMA to LAD, RSVG to Diagonal, RSVG to OM 1, and RSVG to OM 3).  He also had left atrial appendage clipping.  He tolerated the procedure well and was transitioned to to heart in stable condition.  He developed atrial fibrillation was started on amiodarone gtt. his heart rate improved  and he was converted to oral amiodarone.  He was restarted on his home Eliquis.  He also received diuresis for fluid volume overload.  His CXR showed a moderate left-sided pleural effusion.  He underwent thoracentesis and 750 mL of fluid was removed.  Around that time he developed atrial fibrillation with RVR.  He was treated with IV Lopressor.  He underwent DCCV 10/28/2020.  It was initially successful but he quickly converted back to atrial fibrillation.  He was weaned off O2.  He was discharged home in stable condition and rate controlled atrial fibrillation.  He presents to the clinic today for follow-up evaluation states he feels well.  He has been increasing his physical activity slowly.  He states that he is now back at home living on his own and doing all of his normal daily activities.  He has neighbors checking on him at his house as well as his son.  He has been following a low-sodium diet and feels generally well.  He states that at times he can feel his feet heart rate beat faster.  This faster heartbeat is only recently been with increased physical activity.  He states he heats his house with wood on fixing his firewood 1 piece that time.  He is following his sternal precautions "staying inside the box".  I will give him a salty 6 diet sheet, have him continue to increase physical activity as tolerated while adhering to sternal precautions, and have him follow-up with Dr. Duke Salvia in 3 months.  Sternal incision clean dry intact no drainage.  Chest tube sites clean dry intact no drainage.  Today he denies chest pain, shortness of breath, lower extremity edema, fatigue, palpitations, melena, hematuria, hemoptysis, diaphoresis, weakness, presyncope, syncope, orthopnea, and PND.   Home Medications    Prior to Admission medications   Medication Sig Start Date End Date Taking? Authorizing Provider  amiodarone (PACERONE) 200 MG tablet Take 2 tablets (400 mg total) by mouth 2 (two) times daily. For 5  days, then decrease to 200 mg BID x 7 days, then decrease to 200 mg daily 10/28/20   Barrett, Denny Peon R, PA-C  apixaban (ELIQUIS) 5 MG TABS tablet Take 5 mg by mouth in the morning and at bedtime. 06/17/19   [provider]  aspirin EC 81 MG EC tablet Take 1 tablet (81 mg total) by mouth daily. Swallow whole. 10/28/20   Barrett, Erin R, PA-C  carvedilol (COREG) 6.25 MG tablet Take 1 tablet (6.25 mg total) by mouth 2 (two) times daily with a meal. 10/28/20   Barrett, Erin R, PA-C  dapagliflozin propanediol (FARXIGA) 10 MG TABS tablet Take 1 tablet (10 mg total) by mouth daily before breakfast. 10/29/20   Barrett, Erin R, PA-C  digoxin (LANOXIN) 0.25 MG tablet Take 1 tablet (0.25 mg total) by mouth daily. 10/28/20   Barrett, Erin R, PA-C  ezetimibe (ZETIA) 10 MG tablet Take 1 tablet (10 mg total) by mouth daily. 10/28/20   Barrett, Erin R, PA-C  metFORMIN (GLUCOPHAGE) 1000 MG  tablet Take 1,000 mg by mouth 2 (two) times daily. 02/08/16   [provider]  Omega-3 Fatty Acids ([employer] NATURAL FISH OIL) 1000 MG CAPS Take 1 capsule by mouth 2 (two) times daily. 02/15/16   [provider]  omeprazole (PRILOSEC) 20 MG capsule Take 20 mg by mouth daily. 02/08/16   [provider]  rosuvastatin (CRESTOR) 20 MG tablet Take 1 tablet (20 mg total) by mouth daily. 10/28/20   Barrett, Erin R, PA-C  traMADol (ULTRAM) 50 MG tablet Take 1 tablet (50 mg total) by mouth every 4 (four) hours as needed for moderate pain. 10/28/20   Barrett, Rae Roam, PA-C    Family History    Family History  Problem Relation Age of Onset  . Heart attack Mother   . Heart attack Father   . Heart attack Sister   . Heart attack Brother    He indicated that the status of his mother is unknown. He indicated that the status of his father is unknown. He indicated that the status of his sister is unknown. He indicated that the status of his brother is unknown.  Social History    Social History   Socioeconomic History  .  Marital status: Divorced    Spouse name: Not on file  . Number of children: Not on file  . Years of education: Not on file  . Highest education level: Not on file  Occupational History  . Not on file  Tobacco Use  . Smoking status: Former Games developer  . Smokeless tobacco: Never Used  . Tobacco comment: quit over 30 years ago  Vaping Use  . Vaping Use: Never used  Substance and Sexual Activity  . Alcohol use: No  . Drug use: Not on file  . Sexual activity: Not on file  Other Topics Concern  . Not on file  Social History Narrative  . Not on file   Social Determinants of Health   Financial Resource Strain:   . Difficulty of Paying Living Expenses: Not on file  Food Insecurity:   . Worried About Programme researcher, broadcasting/film/video in the Last Year: Not on file  . Ran Out of Food in the Last Year: Not on file  Transportation Needs:   . Lack of Transportation (Medical): Not on file  . Lack of Transportation (Non-Medical): Not on file  Physical Activity:   . Days of Exercise per Week: Not on file  . Minutes of Exercise per Session: Not on file  Stress:   . Feeling of Stress : Not on file  Social Connections:   . Frequency of Communication with Friends and Family: Not on file  . Frequency of Social Gatherings with Friends and Family: Not on file  . Attends Religious Services: Not on file  . Active Member of Clubs or Organizations: Not on file  . Attends Banker Meetings: Not on file  . Marital Status: Not on file  Intimate Partner Violence:   . Fear of Current or Ex-Partner: Not on file  . Emotionally Abused: Not on file  . Physically Abused: Not on file  . Sexually Abused: Not on file     Review of Systems    General:  No chills, fever, night sweats or weight changes.  Cardiovascular:  No chest pain, dyspnea on exertion, edema, orthopnea, palpitations, paroxysmal nocturnal dyspnea. Dermatological: No rash, lesions/masses Respiratory: No cough, dyspnea Urologic: No hematuria,  dysuria Abdominal:   No nausea, vomiting, diarrhea, bright red blood per rectum,  melena, or hematemesis Neurologic:  No visual changes, wkns, changes in mental status. All other systems reviewed and are otherwise negative except as noted above.  Physical Exam    VS:  BP 136/67   Pulse 65   Ht 6\' 1"  (1.854 m)   Wt 230 lb 9.6 oz (104.6 kg)   SpO2 95%   BMI 30.42 kg/m  , BMI Body mass index is 30.42 kg/m. GEN: Well nourished, well developed, in no acute distress. HEENT: normal. Neck: Supple, no JVD, carotid bruits, or masses. Cardiac: RRR, no murmurs, rubs, or gallops. No clubbing, cyanosis, edema.  Radials/DP/PT 2+ and equal bilaterally.  Respiratory:  Respirations regular and unlabored, clear to auscultation bilaterally. GI: Soft, nontender, nondistended, BS + x 4. MS: no deformity or atrophy. Skin: warm and dry, no rash. Neuro:  Strength and sensation are intact. Psych: Normal affect.  Accessory Clinical Findings    Recent Labs: 10/15/2020: ALT 23; B Natriuretic Peptide 154.0 10/19/2020: Magnesium 1.9 10/23/2020: Hemoglobin 11.7; Platelets 441 10/24/2020: TSH 0.975 10/27/2020: BUN 29; Creatinine, Ser 1.28; Potassium 4.0; Sodium 132   Recent Lipid Panel    Component Value Date/Time   CHOL 212 (H) 10/16/2020 0148   TRIG 228 (H) 10/16/2020 0148   HDL 37 (L) 10/16/2020 0148   CHOLHDL 5.7 10/16/2020 0148   VLDL 46 (H) 10/16/2020 0148   LDLCALC 129 (H) 10/16/2020 0148    ECG personally reviewed by me today-normal sinus rhythm nonspecific T wave abnormality 65 bpm- No acute changes  Echocardiogram 10/16/2020 IMPRESSIONS    1. Left ventricular ejection fraction, by estimation, is 50 to 55%. The  left ventricle has low normal function. The left ventricle demonstrates  regional wall motion abnormalities (see scoring diagram/findings for  description). Left ventricular diastolic  parameters are consistent with Grade I diastolic dysfunction (impaired  relaxation).  2.  Right ventricular systolic function is normal. The right ventricular  size is mildly enlarged.  3. Left atrial size was mild to moderately dilated.  4. The mitral valve is grossly normal. No evidence of mitral valve  regurgitation.  5. The aortic valve was not well visualized. Aortic valve regurgitation  is not visualized.  6. The inferior vena cava is normal in size with <50% respiratory  variability, suggesting right atrial pressure of 8 mmHg.   Comparison(s): No prior Echocardiogram.  Cardiac catheterization 10/16/2020  Mid RCA to Dist RCA lesion is 100% stenosed.  RV Branch lesion is 90% stenosed.  ---------------------------  Prox LAD to Mid LAD lesion is 80% stenosed with 70% stenosed side branch in 1st Diag & 1st Sept lesion is 70% stenosed.  Mid LAD lesion is 75% stenosed.  ---------------------------  Prox Cx lesion is 75% stenosed.  Lat 3rd Mrg lesion is 80% stenosed.  Diminutive 4th Mrg lesion is 90% stenosed.  Dist Cx / OM lesion is 90% stenosed.  ===================  There is no aortic valve stenosis.  LV end diastolic pressure is normal.  The left ventricular systolic function is normal.  There is trivial (1+) mitral regurgitation.   SUMMARY  Severe multivessel CAD:  ? Possible subacute occlusion or CTO of RCA after RV marginal branch that has an 90% stenosis, with minimal left to right collaterals;  ? Focal eccentric calcified 80% stenosis of the LAD at the takeoff of SP1 (1st Sept) and D1 (1st Diag) along with eccentric 70% mid LAD, and  ? 75% proximal major LCx that has diffuse areas of relatively severe stenosis in the sidebranches.  Relatively normal EF  with mild inferior hypokinesis.  Relatively normal LVEDP.   RECOMMENDATIONS  With diabetes and existing A. fib on Eliquis already, best option for this gentleman is CVTS consult for CABG.  Continue aggressive risk factor modification.  Results reviewed with Dr. Jordan.  CVTS consult  called  [employer] Dominance: Right  Intervention   Assessment & Plan   1.  Persistent atrial fibrillation-EKG today shows normal sinus rhythm nonspecific T wave abnormality 65 bpm underwent DCCV which was successful but he quickly converted back to atrial fibrillation.  Plan to continue amiodarone and Eliquis with repeat DCCV.  CHA2DS2-VASc score 5.  Blood pressure at home 120s-130s over 60s, monitoring daily. Continue amiodarone, apixaban, carvedilol, digoxin Avoid triggers caffeine, chocolate, EtOH etc. Heart healthy low-sodium diet Continue to follow sternal precautions and slowly increase physical activity   NSTEMI-no chest pain today.  Underwent cardiac catheterization which showed multivessel disease.  Underwent CABG times 4 10/18/20  LIMA to LAD, SVG to OM 3, SVG to OM1, and SVG to first diagonal along with left atrial appendage clipping.  Echo showed an EF of 50-55% Continue carvedilol, rosuvastatin, aspirin, Heart healthy low-sodium diet-salty 6 given  Hyperlipidemia-10/16/2020: Cholesterol 212; HDL 37; LDL Cholesterol 129; Triglycerides 228; VLDL 46 Continue rosuvastatin Heart healthy low-sodium high-fiber diet Repeat fasting lipid panel in 4 weeks.  Type 2 diabetes-glucose 157 on 10/27/2020. Continue Metformin Heart healthy low-sodium carb modified diet Continue to follow sternal precautions increase physical activity slowly   Disposition: Follow-up with Dr. Duke Salvia in 3 months   Thomasene Ripple. Yandell Mcjunkins NP-C    11/15/2020, 3:19 PM [facility] [address] Office [phone] Fax [phone]  Notice: This dictation was prepared with Dragon dictation along with smaller phrase technology. Any transcriptional errors that result from this process are unintentional and may not be corrected upon review.

## 2020-11-10 NOTE — Telephone Encounter (Signed)
Patient has been approved to receive Eliquis for $0 from 11/10/20 through 12/22/20. A renewal form for 2022 will be faxed to provider's office to complete.   Contacted pt's daughter and she verbalized understanding.

## 2020-11-13 NOTE — Telephone Encounter (Signed)
Routing to MD.

## 2020-11-13 NOTE — Telephone Encounter (Signed)
FYI-- Patient's daughter states there was a possible COVID exposure. Someone in the patient's household tested positive for COVID. Patient has been away from the house since possible exposure. Patient is asymptomatic and being tested today. Daughter will call the office back with an update. If no results are available before Wednesday, 11/15/20, appointment will need to be cancelled.

## 2020-11-14 NOTE — Telephone Encounter (Signed)
Noted COVID negative.

## 2020-11-14 NOTE — Telephone Encounter (Signed)
Patient's daughter states they have tested negative and he will keep his appointment tomorrow.

## 2020-11-15 ENCOUNTER — Ambulatory Visit (INDEPENDENT_AMBULATORY_CARE_PROVIDER_SITE_OTHER): Payer: Medicare Other | Admitting: General Practice

## 2020-11-15 ENCOUNTER — Encounter: Payer: Self-pay | Admitting: General Practice

## 2020-11-15 ENCOUNTER — Other Ambulatory Visit: Payer: Self-pay

## 2020-11-15 VITALS — BP 136/67 | HR 65 | Ht 73.0 in | Wt 230.6 lb

## 2020-11-15 DIAGNOSIS — E7800 Pure hypercholesterolemia, unspecified: Secondary | ICD-10-CM

## 2020-11-15 DIAGNOSIS — I4819 Other persistent atrial fibrillation: Secondary | ICD-10-CM

## 2020-11-15 DIAGNOSIS — I214 Non-ST elevation (NSTEMI) myocardial infarction: Secondary | ICD-10-CM

## 2020-11-15 NOTE — Patient Instructions (Signed)
Medication Instructions:  The current medical regimen is effective;  continue present plan and medications as directed. Please refer to the Current Medication list given to you today.  *If you need a refill on your cardiac medications before your next appointment, please call your pharmacy*  Lab Work:   Testing/Procedures:  NONE    NONE  Special Instructions FOLLOW YOUR STERNAL PRECAUTIONS  PLEASE READ AND FOLLOW SALTY 6-ATTACHED-1,800mg  daily  PLEASE INCREASE PHYSICAL ACTIVITY AS TOLERATED  Please avoid these triggers:  Do not use any products that have nicotine or tobacco in them. These include cigarettes, e-cigarettes, and chewing tobacco. If you need help quitting, ask your doctor.  Eat heart-healthy foods. Talk with your doctor about the right eating plan for you.  Exercise regularly as told by your doctor.  Do not drink alcohol, Caffeine or chocolate.  Lose weight if you are overweight.  Do not use drugs, including cannabis  Follow-Up: Your next appointment:  3 month(s) In Person with Chilton Si, MD OR IF UNAVAILABLE JESSE CLEAVER, FNP-C   At [facility], you and your health needs are our priority.  As part of our continuing mission to provide you with exceptional heart care, we have created designated Provider Care Teams.  These Care Teams include your primary Cardiologist (physician) and Advanced Practice Providers (APPs -  Physician Assistants and Nurse Practitioners) who all work together to provide you with the care you need, when you need it.            6 SALTY THINGS TO AVOID     1,800MG  DAILY

## 2020-11-20 ENCOUNTER — Other Ambulatory Visit: Payer: Self-pay | Admitting: Physician Assistant

## 2020-11-28 ENCOUNTER — Other Ambulatory Visit: Payer: Self-pay | Admitting: Thoracic Surgery (Cardiothoracic Vascular Surgery)

## 2020-11-28 DIAGNOSIS — Z951 Presence of aortocoronary bypass graft: Secondary | ICD-10-CM

## 2020-11-30 ENCOUNTER — Encounter: Payer: Self-pay | Admitting: General Practice

## 2020-12-01 ENCOUNTER — Ambulatory Visit
Admission: RE | Admit: 2020-12-01 | Discharge: 2020-12-01 | Disposition: A | Discharge status: Home / Self Care | Payer: Medicare Other | Source: Ambulatory Visit | Attending: Thoracic Surgery (Cardiothoracic Vascular Surgery) | Admitting: Thoracic Surgery (Cardiothoracic Vascular Surgery)

## 2020-12-01 ENCOUNTER — Encounter: Payer: Self-pay | Admitting: Thoracic Surgery (Cardiothoracic Vascular Surgery)

## 2020-12-01 ENCOUNTER — Ambulatory Visit (INDEPENDENT_AMBULATORY_CARE_PROVIDER_SITE_OTHER): Payer: Self-pay | Admitting: Thoracic Surgery (Cardiothoracic Vascular Surgery)

## 2020-12-01 ENCOUNTER — Other Ambulatory Visit: Payer: Self-pay

## 2020-12-01 VITALS — BP 131/69 | HR 80 | Resp 20 | Ht 73.0 in | Wt 227.4 lb

## 2020-12-01 DIAGNOSIS — Z951 Presence of aortocoronary bypass graft: Secondary | ICD-10-CM

## 2020-12-01 NOTE — Progress Notes (Signed)
      [address]       [place] [address]             [phone]        AXIL COPEMAN [facility] Medical Record #[id number] Date of Birth: 08-20-1946  Referring: Josephine Igo, DO Primary Care: [facility] Primary Cardiologist:Tiffany Duke Salvia, MD  Reason for visit:   follow-up  History of Present Illness:     Mr. Herbert Torres comes in for his 1 month appointment.  Overall he has done very well at home.  He is able to walk 3/4 of a mile without difficulty.    Physical Exam: BP 131/69 (BP Location: Right Arm, Patient Position: Sitting)   Pulse 80   Resp 20   Ht 6\' 1"  (1.854 m)   Wt 227 lb 6.4 oz (103.1 kg)   SpO2 98% Comment: RA with mask on  BMI 30.00 kg/m   Alert NAD Incision clean.  Sternum stable Irregular rhythm.  Rate controlled Abdomen soft, ND no peripheral edema   Diagnostic Studies & Laboratory data: CXR: small L effusion     Assessment / Plan:   74 yo male s/p CABG, post-op afib Clear for cardiac rehab Follow up prn   Betta Balla O Pepper Kerrick 12/01/2020 10:01 AM

## 2020-12-11 ENCOUNTER — Encounter (HOSPITAL_COMMUNITY): Payer: Self-pay

## 2020-12-11 ENCOUNTER — Encounter (HOSPITAL_COMMUNITY)
Admission: RE | Admit: 2020-12-11 | Discharge: 2020-12-11 | Disposition: A | Discharge status: Home / Self Care | Payer: Medicare Other | Source: Ambulatory Visit | Attending: Cardiovascular Disease | Admitting: Cardiovascular Disease

## 2020-12-11 ENCOUNTER — Other Ambulatory Visit: Payer: Self-pay

## 2020-12-11 VITALS — BP 140/50 | HR 73 | Ht 73.0 in | Wt 228.0 lb

## 2020-12-11 DIAGNOSIS — Z951 Presence of aortocoronary bypass graft: Secondary | ICD-10-CM | POA: Insufficient documentation

## 2020-12-11 DIAGNOSIS — I214 Non-ST elevation (NSTEMI) myocardial infarction: Secondary | ICD-10-CM | POA: Insufficient documentation

## 2020-12-11 NOTE — Progress Notes (Signed)
Cardiac Individual Treatment Plan  Patient Details  Name: Herbert Torres MRN: [id number] Date of Birth: 21-Feb-1946 Referring Provider:   Flowsheet Row CARDIAC REHAB PHASE II ORIENTATION from 12/11/2020 in [facility] CARDIAC REHABILITATION  Referring Provider Dr. Duke Salvia      Initial Encounter Date:  Flowsheet Row CARDIAC REHAB PHASE II ORIENTATION from 12/11/2020 in [place] CARDIAC REHABILITATION  Date 12/11/20      Visit Diagnosis: NSTEMI (non-ST elevated myocardial infarction) (HCC)  S/P CABG x 4  Patient's Home Medications on Admission:  Current Outpatient Medications:  .  amiodarone (PACERONE) 200 MG tablet, Take 2 tablets (400 mg total) by mouth 2 (two) times daily. For 5 days, then decrease to 200 mg BID x 7 days, then decrease to 200 mg daily (Patient taking differently: Take 200 mg by mouth 2 (two) times daily. For 5 days, then decrease to 200 mg BID x 7 days, then decrease to 200 mg daily), Disp: 90 tablet, Rfl: 1 .  apixaban (ELIQUIS) 5 MG TABS tablet, Take 5 mg by mouth in the morning and at bedtime., Disp: , Rfl:  .  aspirin EC 81 MG EC tablet, Take 1 tablet (81 mg total) by mouth daily. Swallow whole., Disp: 30 tablet, Rfl: 11 .  carvedilol (COREG) 6.25 MG tablet, Take 1 tablet (6.25 mg total) by mouth 2 (two) times daily with a meal., Disp: 60 tablet, Rfl: 3 .  dapagliflozin propanediol (FARXIGA) 10 MG TABS tablet, Take 1 tablet (10 mg total) by mouth daily before breakfast., Disp: 30 tablet, Rfl: 3 .  digoxin (LANOXIN) 0.25 MG tablet, Take 1 tablet (0.25 mg total) by mouth daily., Disp: 30 tablet, Rfl: 3 .  ezetimibe (ZETIA) 10 MG tablet, Take 1 tablet (10 mg total) by mouth daily., Disp: 30 tablet, Rfl: 3 .  metFORMIN (GLUCOPHAGE) 1000 MG tablet, Take 1,000 mg by mouth 2 (two) times daily., Disp: , Rfl: 4 .  Omega-3 Fatty Acids ([employer] NATURAL FISH OIL) 1000 MG CAPS, Take 1 capsule by mouth 2 (two) times daily., Disp: , Rfl: 5 .  omeprazole (PRILOSEC) 20 MG capsule,  Take 20 mg by mouth daily., Disp: , Rfl: 3 .  rosuvastatin (CRESTOR) 20 MG tablet, Take 1 tablet (20 mg total) by mouth daily., Disp: 30 tablet, Rfl: 3  Past Medical History: Past Medical History:  Diagnosis Date  . Coronary artery disease   . Diabetes mellitus without complication (HCC)   . GERD (gastroesophageal reflux disease)   . HOH (hard of hearing)    AIDS  . Hyperlipidemia   . Hypertension     Tobacco Use: Social History   Tobacco Use  Smoking Status Former Smoker  Smokeless Tobacco Never Used  Tobacco Comment   quit over 30 years ago    Labs: Recent Hydrographic surveyor    Labs for ITP Cardiac and Pulmonary Rehab Latest Ref Rng & Units 10/18/2020 10/18/2020 10/18/2020 10/18/2020 10/19/2020   Cholestrol 0 - 200 mg/dL - - - - -   LDLCALC 0 - 99 mg/dL - - - - -   HDL >13 mg/dL - - - - -   Trlycerides <150 mg/dL - - - - -   Hemoglobin A1c 4.8 - 5.6 % - - - - -   PHART 7.350 - 7.450 - 7.386 7.321(L) 7.335(L) 7.334(L)   PCO2ART 32.0 - 48.0 mmHg - 39.0 42.1 42.6 38.9   HCO3 20.0 - 28.0 mmol/L - 23.5 21.7 22.6 20.5   TCO2 22 - 32 mmol/L  24 25 23 24 22    ACIDBASEDEF 0.0 - 2.0 mmol/L - 2.0 4.0(H) 3.0(H) 5.0(H)   O2SAT % - 94.0 94.0 92.0 96.0      Capillary Blood Glucose: Lab Results  Component Value Date   GLUCAP 130 (H) 10/29/2020   GLUCAP 90 10/28/2020   GLUCAP 213 (H) 10/28/2020   GLUCAP 133 (H) 10/28/2020   GLUCAP 169 (H) 10/28/2020     Exercise Target Goals: Exercise Program Goal: Individual exercise prescription set using results from initial 6 min walk test and THRR while considering  patient's activity barriers and safety.   Exercise Prescription Goal: Starting with aerobic activity 30 plus minutes a day, 3 days per week for initial exercise prescription. Provide home exercise prescription and guidelines that participant acknowledges understanding prior to discharge.  Activity Barriers & Risk Stratification:  Activity Barriers & Cardiac Risk  Stratification - 12/11/20 1304      Activity Barriers & Cardiac Risk Stratification   Activity Barriers None    Cardiac Risk Stratification High           6 Minute Walk:  6 Minute Walk    Row Name 12/11/20 1421         6 Minute Walk   Phase Initial     Distance 1200 feet     Walk Time 6 minutes     # of Rest Breaks 0     MPH 2.27     METS 2.41     RPE 14     VO2 Peak 8.44     Symptoms No     Resting HR 60 bpm     Resting BP 140/50     Resting Oxygen Saturation  95 %     Exercise Oxygen Saturation  during 6 min walk 97 %     Max Ex. HR 80 bpm     Max Ex. BP 180/72     2 Minute Post BP 156/64            Oxygen Initial Assessment:   Oxygen Re-Evaluation:   Oxygen Discharge (Final Oxygen Re-Evaluation):   Initial Exercise Prescription:  Initial Exercise Prescription - 12/11/20 1400      Date of Initial Exercise RX and Referring Provider   Date 12/11/20    Referring Provider Dr. Duke Salvia    Expected Discharge Date 03/16/21      NuStep   Level 1    SPM 80    Minutes 22      Arm Ergometer   Level 1    RPM 60    Minutes 17      Prescription Details   Frequency (times per week) 2    Duration Progress to 30 minutes of continuous aerobic without signs/symptoms of physical distress      Intensity   THRR 40-80% of Max Heartrate 58-115    Ratings of Perceived Exertion 11-13    Perceived Dyspnea 0-4      Resistance Training   Training Prescription Yes    Weight 3 lbs    Reps 10-15           Perform Capillary Blood Glucose checks as needed.  Exercise Prescription Changes:   Exercise Comments:   Exercise Goals and Review:   Exercise Goals    Row Name 12/11/20 1424             Exercise Goals   Increase Physical Activity Yes       Intervention Provide advice, education, support and counseling  about physical activity/exercise needs.;Develop an individualized exercise prescription for aerobic and resistive training based on initial  evaluation findings, risk stratification, comorbidities and participant's personal goals.       Expected Outcomes Short Term: Attend rehab on a regular basis to increase amount of physical activity.;Long Term: Add in home exercise to make exercise part of routine and to increase amount of physical activity.;Long Term: Exercising regularly at least 3-5 days a week.       Increase Strength and Stamina Yes       Intervention Provide advice, education, support and counseling about physical activity/exercise needs.;Develop an individualized exercise prescription for aerobic and resistive training based on initial evaluation findings, risk stratification, comorbidities and participant's personal goals.       Expected Outcomes Short Term: Increase workloads from initial exercise prescription for resistance, speed, and METs.;Short Term: Perform resistance training exercises routinely during rehab and add in resistance training at home;Long Term: Improve cardiorespiratory fitness, muscular endurance and strength as measured by increased METs and functional capacity ( )       Able to understand and use rate of perceived exertion (RPE) scale Yes       Intervention Provide education and explanation on how to use RPE scale       Expected Outcomes Short Term: Able to use RPE daily in rehab to express subjective intensity level;Long Term:  Able to use RPE to guide intensity level when exercising independently       Knowledge and understanding of Target Heart Rate Range (THRR) Yes       Intervention Provide education and explanation of THRR including how the numbers were predicted and where they are located for reference       Expected Outcomes Short Term: Able to state/look up THRR;Long Term: Able to use THRR to govern intensity when exercising independently;Short Term: Able to use daily as guideline for intensity in rehab       Able to check pulse independently Yes       Intervention Provide education and  demonstration on how to check pulse in carotid and radial arteries.;Review the importance of being able to check your own pulse for safety during independent exercise       Expected Outcomes Short Term: Able to explain why pulse checking is important during independent exercise;Long Term: Able to check pulse independently and accurately       Understanding of Exercise Prescription Yes       Intervention Provide education, explanation, and written materials on patient's individual exercise prescription       Expected Outcomes Short Term: Able to explain program exercise prescription;Long Term: Able to explain home exercise prescription to exercise independently              Exercise Goals Re-Evaluation :    Discharge Exercise Prescription (Final Exercise Prescription Changes):   Nutrition:  Target Goals: Understanding of nutrition guidelines, daily intake of sodium 1500mg , cholesterol 200mg , calories 30% from fat and 7% or less from saturated fats, daily to have 5 or more servings of fruits and vegetables.  Biometrics:  Pre Biometrics - 12/11/20 1424      Pre Biometrics   Height 6\' 1"  (1.854 m)    Weight 103.4 kg    Waist Circumference 44.5 inches    Hip Circumference 46 inches    Waist to Hip Ratio 0.97 %    BMI (Calculated) 30.08    Triceps Skinfold 16 mm    % Body Fat 30.1 %  Grip Strength 34.6 kg    Flexibility 0 in    Single Leg Stand 4.98 seconds            Nutrition Therapy Plan and Nutrition Goals:  Nutrition Therapy & Goals - 12/11/20 1452      Personal Nutrition Goals   Comments Patient scored 63 on his medficts diet assessment. He says he is trying to eat healthier but has several women that like to cook for him and he eats out often as well. Discussed his score with him and his daughter and reviewed handout provided regarding making healthier choices.      Intervention Plan   Intervention Nutrition handout(s) given to patient.           Nutrition  Assessments:  Nutrition Assessments - 12/11/20 1453      MEDFICTS Scores   Pre Score 63          MEDIFICTS Score Key:  ?70 Need to make dietary changes   40-70 Heart Healthy Diet  ? 40 Therapeutic Level Cholesterol Diet   Picture Your Plate Scores:  <37 Unhealthy dietary pattern with much room for improvement.  41-50 Dietary pattern unlikely to meet recommendations for good health and room for improvement.  51-60 More healthful dietary pattern, with some room for improvement.   >60 Healthy dietary pattern, although there may be some specific behaviors that could be improved.    Nutrition Goals Re-Evaluation:   Nutrition Goals Discharge (Final Nutrition Goals Re-Evaluation):   Psychosocial: Target Goals: Acknowledge presence or absence of significant depression and/or stress, maximize coping skills, provide positive support system. Participant is able to verbalize types and ability to use techniques and skills needed for reducing stress and depression.  Initial Review & Psychosocial Screening:  Initial Psych Review & Screening - 12/11/20 1449      Initial Review   Current issues with None Identified      Family Dynamics   Good Support System? Yes    Comments Patient has no psychosocial issues identified. His initial QOL score was 30.28% overall and his PHQ-9 score was 3. He denies any depression, anxiety or stress. He lives alone. He had a daughter and a son that he speaks with everyday and he reports having a special friend he likes to spend time with. He demonstrates a positive outlook about his health and his future. Will continue to monitor.      Barriers   Psychosocial barriers to participate in program There are no identifiable barriers or psychosocial needs.      Screening Interventions   Interventions Encouraged to exercise    Expected Outcomes Long Term goal: The participant improves quality of Life and PHQ9 Scores as seen by post scores and/or  verbalization of changes           Quality of Life Scores:  Quality of Life - 12/11/20 1420      Quality of Life   Select Quality of Life      Quality of Life Scores   Health/Function Pre 22.73 %    Socioeconomic Pre 23.21 %    Psych/Spiritual Pre 24.43 %    Family Pre 26.1 %    GLOBAL Pre 23.68 %          Scores of 19 and below usually indicate a poorer quality of life in these areas.  A difference of  2-3 points is a clinically meaningful difference.  A difference of 2-3 points in the total score of the Quality of  Life Index has been associated with significant improvement in overall quality of life, self-image, physical symptoms, and general health in studies assessing change in quality of life.  PHQ-9: Recent Review Flowsheet Data    Depression screen [facility] 2/9 12/11/2020   Decreased Interest 0   Down, Depressed, Hopeless 0   PHQ - 2 Score 0   Altered sleeping 2   Tired, decreased energy 1   Change in appetite 0   Feeling bad or failure about yourself  0   Trouble concentrating 0   Moving slowly or fidgety/restless 0   Suicidal thoughts 0   PHQ-9 Score 3   Difficult doing work/chores Not difficult at all     Interpretation of Total Score  Total Score Depression Severity:  1-4 = Minimal depression, 5-9 = Mild depression, 10-14 = Moderate depression, 15-19 = Moderately severe depression, 20-27 = Severe depression   Psychosocial Evaluation and Intervention:   Psychosocial Re-Evaluation:   Psychosocial Discharge (Final Psychosocial Re-Evaluation):   Vocational Rehabilitation: Provide vocational rehab assistance to qualifying candidates.   Vocational Rehab Evaluation & Intervention:  Vocational Rehab - 12/11/20 1455      Initial Vocational Rehab Evaluation & Intervention   Assessment shows need for Vocational Rehabilitation No      Vocational Rehab Re-Evaulation   Comments Patient is retired and is not interested in returning to work. He does not need  vocational rehab.           Education: Education Goals: Education classes will be provided on a weekly basis, covering required topics. Participant will state understanding/return demonstration of topics presented.  Learning Barriers/Preferences:  Learning Barriers/Preferences - 12/11/20 1454      Learning Barriers/Preferences   Learning Barriers Reading    Learning Preferences Audio;Pictoral;Skilled Demonstration           Education Topics: Hypertension, Hypertension Reduction -Define heart disease and high blood pressure. Discus how high blood pressure affects the body and ways to reduce high blood pressure.   Exercise and Your Heart -Discuss why it is important to exercise, the FITT principles of exercise, normal and abnormal responses to exercise, and how to exercise safely.   Angina -Discuss definition of angina, causes of angina, treatment of angina, and how to decrease risk of having angina.   Cardiac Medications -Review what the following cardiac medications are used for, how they affect the body, and side effects that may occur when taking the medications.  Medications include Aspirin, Beta blockers, calcium channel blockers, ACE Inhibitors, angiotensin receptor blockers, diuretics, digoxin, and antihyperlipidemics.   Congestive Heart Failure -Discuss the definition of CHF, how to live with CHF, the signs and symptoms of CHF, and how keep track of weight and sodium intake.   Heart Disease and Intimacy -Discus the effect sexual activity has on the heart, how changes occur during intimacy as we age, and safety during sexual activity.   Smoking Cessation / COPD -Discuss different methods to quit smoking, the health benefits of quitting smoking, and the definition of COPD.   Nutrition I: Fats -Discuss the types of cholesterol, what cholesterol does to the heart, and how cholesterol levels can be controlled.   Nutrition II: Labels -Discuss the different  components of food labels and how to read food label   Heart Parts/Heart Disease and PAD -Discuss the anatomy of the heart, the pathway of blood circulation through the heart, and these are affected by heart disease.   Stress I: Signs and Symptoms -Discuss the causes of stress, how  stress may lead to anxiety and depression, and ways to limit stress.   Stress II: Relaxation -Discuss different types of relaxation techniques to limit stress.   Warning Signs of Stroke / TIA -Discuss definition of a stroke, what the signs and symptoms are of a stroke, and how to identify when someone is having stroke.   Knowledge Questionnaire Score:  Knowledge Questionnaire Score - 12/11/20 1455      Knowledge Questionnaire Score   Pre Score 12/24           Core Components/Risk Factors/Patient Goals at Admission:  Personal Goals and Risk Factors at Admission - 12/11/20 1456      Core Components/Risk Factors/Patient Goals on Admission    Weight Management Yes    Intervention Weight Management/Obesity: Establish reasonable short term and long term weight goals.    Admit Weight 229 lb (103.9 kg)    Goal Weight: Short Term 219 lb (99.3 kg)    Goal Weight: Long Term 224 lb (101.6 kg)    Expected Outcomes Long Term: Adherence to nutrition and physical activity/exercise program aimed toward attainment of established weight goal    Diabetes Yes    Intervention Provide education about signs/symptoms and action to take for hypo/hyperglycemia.;Provide education about proper nutrition, including hydration, and aerobic/resistive exercise prescription along with prescribed medications to achieve blood glucose in normal ranges: Fasting glucose 65-99 mg/dL    Expected Outcomes Short Term: Participant verbalizes understanding of the signs/symptoms and immediate care of hyper/hypoglycemia, proper foot care and importance of medication, aerobic/resistive exercise and nutrition plan for blood glucose control.;Long  Term: Attainment of HbA1C < 7%.    Personal Goal Other Yes    Personal Goal Get stronger; have more energy. Lose weight.    Intervention Patient will attend CR 3 days/week and supplement with exercise 2 days/week.    Expected Outcomes Patient will meet both personal and program goals.           Core Components/Risk Factors/Patient Goals Review:    Core Components/Risk Factors/Patient Goals at Discharge (Final Review):    ITP Comments:   Comments: Patient arrived for 1st visit/orientation/education at 1230. Patient was referred to CR by Chilton Si, MD due to NSTEMI (I21.4) and S/P CABGx4 (Z95.1). During orientation advised patient on arrival and appointment times what to wear, what to do before, during and after exercise. Reviewed attendance and class policy.  Pt is scheduled to return Cardiac Rehab on 12/25/2020 at 9:30. Pt was advised to come to class 15 minutes before class starts.  Discussed RPE/Dpysnea scales. Patient participated in warm up stretches. Patient was able to complete 6 minute walk test.  Telemetry: NSR. Patient was measured for the equipment. Discussed equipment safety with patient. Took patient pre-anthropometric measurements. Patient finished visit at 1400.

## 2020-12-11 NOTE — Progress Notes (Signed)
Cardiac/Pulmonary Rehab Medication Review by a Pharmacist  Does the patient  feel that his/her medications are working for him/her?  yes  Has the patient been experiencing any side effects to the medications prescribed?  no  Does the patient measure his/her own blood pressure or blood glucose at home?  yes   Does the patient have any problems obtaining medications due to transportation or finances?   no  Understanding of regimen: excellent Understanding of indications: excellent Potential of compliance: excellent  Questions asked to Determine Patient Understanding of Medication Regimen:  1. What is the name of the medication?  2. What is the medication used for?  3. When should it be taken?  4. How much should be taken?  5. How will you take it?  6. What side effects should you report?  Understanding Defined as: Excellent: All questions above are correct Good: Questions 1-4 are correct Fair: Questions 1-2 are correct  Poor: 1 or none of the above questions are correct   Pharmacist comments: Overall, patient/wife have great understanding of medications and regiment. Following up with cardiology in February. No further questions.    Tad Moore 12/11/2020 1:18 PM

## 2020-12-14 ENCOUNTER — Other Ambulatory Visit: Payer: Self-pay | Admitting: Physician Assistant

## 2020-12-25 ENCOUNTER — Encounter (HOSPITAL_COMMUNITY): Payer: Self-pay

## 2020-12-25 ENCOUNTER — Encounter (HOSPITAL_COMMUNITY): Payer: Medicare Other

## 2020-12-27 ENCOUNTER — Other Ambulatory Visit: Payer: Self-pay

## 2020-12-27 ENCOUNTER — Encounter (HOSPITAL_COMMUNITY)
Admission: RE | Admit: 2020-12-27 | Discharge: 2020-12-27 | Disposition: A | Discharge status: Home / Self Care | Payer: Medicare Other | Source: Ambulatory Visit | Attending: Cardiovascular Disease | Admitting: Cardiovascular Disease

## 2020-12-27 DIAGNOSIS — I214 Non-ST elevation (NSTEMI) myocardial infarction: Secondary | ICD-10-CM | POA: Diagnosis present

## 2020-12-27 DIAGNOSIS — Z951 Presence of aortocoronary bypass graft: Secondary | ICD-10-CM | POA: Insufficient documentation

## 2020-12-27 NOTE — Progress Notes (Signed)
Daily Session Note  Patient Details  Name: Herbert Torres MRN: [id number] Date of Birth: 04/11/1946 Referring Provider:   Flowsheet Row CARDIAC REHAB PHASE II ORIENTATION from 12/11/2020 in [place]  Referring Provider Dr. Oval Linsey      Encounter Date: 12/27/2020  Check In:  Session Check In - 12/27/20 0930      Check-In   Supervising physician immediately available to respond to emergencies [facility] MD immediately available    Physician(s) Dr. Domenic Polite    Location [facility]    Staff Present Cathren Harsh, MS, Exercise Physiologist;Dalton Kris Mouton, MS, ACSM-CEP, Exercise Physiologist;Gennavieve Huq Wynetta Emery, RN, BSN    Virtual Visit No    Medication changes reported     No    Fall or balance concerns reported    No    Tobacco Cessation No Change    Warm-up and Cool-down Performed as group-led instruction    Resistance Training Performed Yes    VAD Patient? No    PAD/SET Patient? No      Pain Assessment   Currently in Pain? No/denies    Pain Score 0-No pain    Multiple Pain Sites No           Capillary Blood Glucose: No results found for this or any previous visit (from the past 24 hour(s)).    Social History   Tobacco Use  Smoking Status Former Smoker  Smokeless Tobacco Never Used  Tobacco Comment   quit over 30 years ago    Goals Met:  Independence with exercise equipment Exercise tolerated well No report of cardiac concerns or symptoms Strength training completed today  Goals Unmet:  Not Applicable  Comments: Check out 1030.   Dr. Kathie Dike is Medical Director for [facility] Pulmonary Rehab.

## 2020-12-29 ENCOUNTER — Other Ambulatory Visit: Payer: Self-pay

## 2020-12-29 ENCOUNTER — Encounter (HOSPITAL_COMMUNITY)
Admission: RE | Admit: 2020-12-29 | Discharge: 2020-12-29 | Disposition: A | Discharge status: Home / Self Care | Payer: Medicare Other | Source: Ambulatory Visit | Attending: Cardiovascular Disease | Admitting: Cardiovascular Disease

## 2020-12-29 DIAGNOSIS — I214 Non-ST elevation (NSTEMI) myocardial infarction: Secondary | ICD-10-CM | POA: Diagnosis not present

## 2020-12-29 DIAGNOSIS — Z951 Presence of aortocoronary bypass graft: Secondary | ICD-10-CM

## 2020-12-29 NOTE — Progress Notes (Signed)
Daily Session Note  Patient Details  Name: Herbert Torres MRN: [id number] Date of Birth: 1946-03-26 Referring Provider:   Flowsheet Row CARDIAC REHAB PHASE II ORIENTATION from 12/11/2020 in [place]  Referring Provider Dr. Oval Linsey      Encounter Date: 12/29/2020  Check In:  Session Check In - 12/29/20 0930      Check-In   Supervising physician immediately available to respond to emergencies [facility] MD immediately available    Physician(s) Dr. Harl Bowie    Location [facility]    Staff Present Cathren Harsh, MS, Exercise Physiologist;Dalton Kris Mouton, MS, ACSM-CEP, Exercise Physiologist    Virtual Visit No    Medication changes reported     No    Fall or balance concerns reported    No    Tobacco Cessation No Change    Warm-up and Cool-down Performed as group-led instruction    Resistance Training Performed Yes    VAD Patient? No    PAD/SET Patient? No      Pain Assessment   Currently in Pain? No/denies    Multiple Pain Sites No           Capillary Blood Glucose: No results found for this or any previous visit (from the past 24 hour(s)).    Social History   Tobacco Use  Smoking Status Former Smoker  Smokeless Tobacco Never Used  Tobacco Comment   quit over 30 years ago    Goals Met:  Independence with exercise equipment Exercise tolerated well No report of cardiac concerns or symptoms Strength training completed today  Goals Unmet:  Not Applicable  Comments: check out 1030   Dr. Kathie Dike is Medical Director for [facility] Pulmonary Rehab.

## 2021-01-01 ENCOUNTER — Other Ambulatory Visit: Payer: Self-pay

## 2021-01-01 ENCOUNTER — Encounter (HOSPITAL_COMMUNITY)
Admission: RE | Admit: 2021-01-01 | Discharge: 2021-01-01 | Disposition: A | Discharge status: Home / Self Care | Payer: Medicare Other | Source: Ambulatory Visit | Attending: Cardiovascular Disease | Admitting: Cardiovascular Disease

## 2021-01-01 VITALS — Wt 222.2 lb

## 2021-01-01 DIAGNOSIS — Z951 Presence of aortocoronary bypass graft: Secondary | ICD-10-CM

## 2021-01-01 DIAGNOSIS — I214 Non-ST elevation (NSTEMI) myocardial infarction: Secondary | ICD-10-CM | POA: Diagnosis not present

## 2021-01-01 NOTE — Progress Notes (Signed)
Daily Session Note  Patient Details  Name: Herbert Torres MRN: [id number] Date of Birth: [birth year] Referring Provider:   Flowsheet Row CARDIAC REHAB PHASE II ORIENTATION from 12/11/2020 in [place]  Referring Provider Dr. Oval Linsey      Encounter Date: 01/01/2021  Check In:  Session Check In - 01/01/21 0930      Check-In   Supervising physician immediately available to respond to emergencies [facility] MD immediately available    Physician(s) Dr. Harl Bowie    Location [facility]    Staff Present Aundra Dubin, RN, Bjorn Loser, MS, ACSM-CEP, Exercise Physiologist    Virtual Visit No    Medication changes reported     No    Tobacco Cessation No Change    Warm-up and Cool-down Performed as group-led instruction    Resistance Training Performed Yes    VAD Patient? No    PAD/SET Patient? No      Pain Assessment   Currently in Pain? No/denies    Pain Score 0-No pain    Multiple Pain Sites No           Capillary Blood Glucose: No results found for this or any previous visit (from the past 24 hour(s)).    Social History   Tobacco Use  Smoking Status Former Smoker  Smokeless Tobacco Never Used  Tobacco Comment   quit over 30 years ago    Goals Met:  Independence with exercise equipment Exercise tolerated well No report of cardiac concerns or symptoms Strength training completed today  Goals Unmet:  Not Applicable  Comments: checkout time is 10   Dr. Kathie Dike is Medical Director for [facility].

## 2021-01-03 ENCOUNTER — Encounter (HOSPITAL_COMMUNITY): Payer: Medicare Other

## 2021-01-03 NOTE — Progress Notes (Signed)
Cardiac Individual Treatment Plan  Patient Details  Name: Herbert Torres MRN: [id number] Date of Birth: 21-Feb-1946 Referring Provider:   Flowsheet Row CARDIAC REHAB PHASE II ORIENTATION from 12/11/2020 in [facility] CARDIAC REHABILITATION  Referring Provider Dr. Duke Salvia      Initial Encounter Date:  Flowsheet Row CARDIAC REHAB PHASE II ORIENTATION from 12/11/2020 in [place] CARDIAC REHABILITATION  Date 12/11/20      Visit Diagnosis: NSTEMI (non-ST elevated myocardial infarction) (HCC)  S/P CABG x 4  Patient's Home Medications on Admission:  Current Outpatient Medications:  .  amiodarone (PACERONE) 200 MG tablet, Take 2 tablets (400 mg total) by mouth 2 (two) times daily. For 5 days, then decrease to 200 mg BID x 7 days, then decrease to 200 mg daily (Patient taking differently: Take 200 mg by mouth 2 (two) times daily. For 5 days, then decrease to 200 mg BID x 7 days, then decrease to 200 mg daily), Disp: 90 tablet, Rfl: 1 .  apixaban (ELIQUIS) 5 MG TABS tablet, Take 5 mg by mouth in the morning and at bedtime., Disp: , Rfl:  .  aspirin EC 81 MG EC tablet, Take 1 tablet (81 mg total) by mouth daily. Swallow whole., Disp: 30 tablet, Rfl: 11 .  carvedilol (COREG) 6.25 MG tablet, Take 1 tablet (6.25 mg total) by mouth 2 (two) times daily with a meal., Disp: 60 tablet, Rfl: 3 .  dapagliflozin propanediol (FARXIGA) 10 MG TABS tablet, Take 1 tablet (10 mg total) by mouth daily before breakfast., Disp: 30 tablet, Rfl: 3 .  digoxin (LANOXIN) 0.25 MG tablet, Take 1 tablet (0.25 mg total) by mouth daily., Disp: 30 tablet, Rfl: 3 .  ezetimibe (ZETIA) 10 MG tablet, Take 1 tablet (10 mg total) by mouth daily., Disp: 30 tablet, Rfl: 3 .  metFORMIN (GLUCOPHAGE) 1000 MG tablet, Take 1,000 mg by mouth 2 (two) times daily., Disp: , Rfl: 4 .  Omega-3 Fatty Acids ([employer] NATURAL FISH OIL) 1000 MG CAPS, Take 1 capsule by mouth 2 (two) times daily., Disp: , Rfl: 5 .  omeprazole (PRILOSEC) 20 MG capsule,  Take 20 mg by mouth daily., Disp: , Rfl: 3 .  rosuvastatin (CRESTOR) 20 MG tablet, Take 1 tablet (20 mg total) by mouth daily., Disp: 30 tablet, Rfl: 3  Past Medical History: Past Medical History:  Diagnosis Date  . Coronary artery disease   . Diabetes mellitus without complication (HCC)   . GERD (gastroesophageal reflux disease)   . HOH (hard of hearing)    AIDS  . Hyperlipidemia   . Hypertension     Tobacco Use: Social History   Tobacco Use  Smoking Status Former Smoker  Smokeless Tobacco Never Used  Tobacco Comment   quit over 30 years ago    Labs: Recent Hydrographic surveyor    Labs for ITP Cardiac and Pulmonary Rehab Latest Ref Rng & Units 10/18/2020 10/18/2020 10/18/2020 10/18/2020 10/19/2020   Cholestrol 0 - 200 mg/dL - - - - -   LDLCALC 0 - 99 mg/dL - - - - -   HDL >13 mg/dL - - - - -   Trlycerides <150 mg/dL - - - - -   Hemoglobin A1c 4.8 - 5.6 % - - - - -   PHART 7.350 - 7.450 - 7.386 7.321(L) 7.335(L) 7.334(L)   PCO2ART 32.0 - 48.0 mmHg - 39.0 42.1 42.6 38.9   HCO3 20.0 - 28.0 mmol/L - 23.5 21.7 22.6 20.5   TCO2 22 - 32 mmol/L  24 25 23 24 22    ACIDBASEDEF 0.0 - 2.0 mmol/L - 2.0 4.0(H) 3.0(H) 5.0(H)   O2SAT % - 94.0 94.0 92.0 96.0      Capillary Blood Glucose: Lab Results  Component Value Date   GLUCAP 130 (H) 10/29/2020   GLUCAP 90 10/28/2020   GLUCAP 213 (H) 10/28/2020   GLUCAP 133 (H) 10/28/2020   GLUCAP 169 (H) 10/28/2020    POCT Glucose    Row Name 01/01/21 1248             POCT Blood Glucose   Pre-Exercise 156 mg/dL              Exercise Target Goals: Exercise Program Goal: Individual exercise prescription set using results from initial 6 min walk test and THRR while considering  patient's activity barriers and safety.   Exercise Prescription Goal: Starting with aerobic activity 30 plus minutes a day, 3 days per week for initial exercise prescription. Provide home exercise prescription and guidelines that participant acknowledges  understanding prior to discharge.  Activity Barriers & Risk Stratification:  Activity Barriers & Cardiac Risk Stratification - 12/11/20 1304      Activity Barriers & Cardiac Risk Stratification   Activity Barriers None    Cardiac Risk Stratification High           6 Minute Walk:  6 Minute Walk    Row Name 12/11/20 1421         6 Minute Walk   Phase Initial     Distance 1200 feet     Walk Time 6 minutes     # of Rest Breaks 0     MPH 2.27     METS 2.41     RPE 14     VO2 Peak 8.44     Symptoms No     Resting HR 60 bpm     Resting BP 140/50     Resting Oxygen Saturation  95 %     Exercise Oxygen Saturation  during 6 min walk 97 %     Max Ex. HR 80 bpm     Max Ex. BP 180/72     2 Minute Post BP 156/64            Oxygen Initial Assessment:   Oxygen Re-Evaluation:   Oxygen Discharge (Final Oxygen Re-Evaluation):   Initial Exercise Prescription:  Initial Exercise Prescription - 12/11/20 1400      Date of Initial Exercise RX and Referring Provider   Date 12/11/20    Referring Provider Dr. Duke Salvia    Expected Discharge Date 03/16/21      NuStep   Level 1    SPM 80    Minutes 22      Arm Ergometer   Level 1    RPM 60    Minutes 17      Prescription Details   Frequency (times per week) 2    Duration Progress to 30 minutes of continuous aerobic without signs/symptoms of physical distress      Intensity   THRR 40-80% of Max Heartrate 58-115    Ratings of Perceived Exertion 11-13    Perceived Dyspnea 0-4      Resistance Training   Training Prescription Yes    Weight 3 lbs    Reps 10-15           Perform Capillary Blood Glucose checks as needed.  Exercise Prescription Changes:  Exercise Prescription Changes    Row Name 01/01/21 1200  Response to Exercise   Blood Pressure (Admit) 162/78       Blood Pressure (Exercise) 174/72       Blood Pressure (Exit) 124/68       Heart Rate (Admit) 78 bpm       Heart Rate (Exercise)  101 bpm       Heart Rate (Exit) 80 bpm       Rating of Perceived Exertion (Exercise) 13       Duration Continue with 30 min of aerobic exercise without signs/symptoms of physical distress.       Intensity THRR unchanged               Progression   Progression Continue to progress workloads to maintain intensity without signs/symptoms of physical distress.               Resistance Training   Training Prescription Yes       Weight 3 lbs       Reps 10-15               NuStep   Level 1       SPM 98       Minutes 22       METs 2.3               Arm Ergometer   Level 1       RPM 45       Minutes 17       METs 1.5              Exercise Comments:  Exercise Comments    Row Name 12/27/20 1045           Exercise Comments Patient completed first day of rehab. He tolerated exercise well with no complaints. He said that he enjoyed the session and looks forward to coming back and completing the program.              Exercise Goals and Review:  Exercise Goals    Row Name 12/11/20 1424 01/01/21 1248           Exercise Goals   Increase Physical Activity Yes Yes      Intervention Provide advice, education, support and counseling about physical activity/exercise needs.;Develop an individualized exercise prescription for aerobic and resistive training based on initial evaluation findings, risk stratification, comorbidities and participant's personal goals. Provide advice, education, support and counseling about physical activity/exercise needs.;Develop an individualized exercise prescription for aerobic and resistive training based on initial evaluation findings, risk stratification, comorbidities and participant's personal goals.      Expected Outcomes Short Term: Attend rehab on a regular basis to increase amount of physical activity.;Long Term: Add in home exercise to make exercise part of routine and to increase amount of physical activity.;Long Term: Exercising regularly at  least 3-5 days a week. Short Term: Attend rehab on a regular basis to increase amount of physical activity.;Long Term: Add in home exercise to make exercise part of routine and to increase amount of physical activity.;Long Term: Exercising regularly at least 3-5 days a week.      Increase Strength and Stamina Yes Yes      Intervention Provide advice, education, support and counseling about physical activity/exercise needs.;Develop an individualized exercise prescription for aerobic and resistive training based on initial evaluation findings, risk stratification, comorbidities and participant's personal goals. Provide advice, education, support and counseling about physical activity/exercise needs.;Develop an individualized exercise prescription for aerobic and resistive training based on  initial evaluation findings, risk stratification, comorbidities and participant's personal goals.      Expected Outcomes Short Term: Increase workloads from initial exercise prescription for resistance, speed, and METs.;Short Term: Perform resistance training exercises routinely during rehab and add in resistance training at home;Long Term: Improve cardiorespiratory fitness, muscular endurance and strength as measured by increased METs and functional capacity ( ) Short Term: Increase workloads from initial exercise prescription for resistance, speed, and METs.;Short Term: Perform resistance training exercises routinely during rehab and add in resistance training at home;Long Term: Improve cardiorespiratory fitness, muscular endurance and strength as measured by increased METs and functional capacity ( )      Able to understand and use rate of perceived exertion (RPE) scale Yes Yes      Intervention Provide education and explanation on how to use RPE scale Provide education and explanation on how to use RPE scale      Expected Outcomes Short Term: Able to use RPE daily in rehab to express subjective intensity level;Long  Term:  Able to use RPE to guide intensity level when exercising independently Short Term: Able to use RPE daily in rehab to express subjective intensity level;Long Term:  Able to use RPE to guide intensity level when exercising independently      Knowledge and understanding of Target Heart Rate Range (THRR) Yes Yes      Intervention Provide education and explanation of THRR including how the numbers were predicted and where they are located for reference Provide education and explanation of THRR including how the numbers were predicted and where they are located for reference      Expected Outcomes Short Term: Able to state/look up THRR;Long Term: Able to use THRR to govern intensity when exercising independently;Short Term: Able to use daily as guideline for intensity in rehab Short Term: Able to state/look up THRR;Long Term: Able to use THRR to govern intensity when exercising independently;Short Term: Able to use daily as guideline for intensity in rehab      Able to check pulse independently Yes Yes      Intervention Provide education and demonstration on how to check pulse in carotid and radial arteries.;Review the importance of being able to check your own pulse for safety during independent exercise Provide education and demonstration on how to check pulse in carotid and radial arteries.;Review the importance of being able to check your own pulse for safety during independent exercise      Expected Outcomes Short Term: Able to explain why pulse checking is important during independent exercise;Long Term: Able to check pulse independently and accurately Short Term: Able to explain why pulse checking is important during independent exercise;Long Term: Able to check pulse independently and accurately      Understanding of Exercise Prescription Yes Yes      Intervention Provide education, explanation, and written materials on patient's individual exercise prescription Provide education, explanation, and  written materials on patient's individual exercise prescription      Expected Outcomes Short Term: Able to explain program exercise prescription;Long Term: Able to explain home exercise prescription to exercise independently Short Term: Able to explain program exercise prescription;Long Term: Able to explain home exercise prescription to exercise independently             Exercise Goals Re-Evaluation :  Exercise Goals Re-Evaluation    Row Name 01/01/21 1248             Exercise Goal Re-Evaluation   Exercise Goals Review Increase Physical Activity;Increase Strength and Stamina;Able to  understand and use rate of perceived exertion (RPE) scale;Knowledge and understanding of Target Heart Rate Range (THRR);Able to check pulse independently;Understanding of Exercise Prescription       Comments Pt has attended 3 exercise sessions. He has a positive attitude and works hard while he is in rehab. He is currently exercising at 2.3 METs on the stepper. His BP has been elevated at both rest and during exercise so far. If this continues to be a trend, we will inform his cardiologist. Will continue to monitor and progress as able.       Expected Outcomes Through exercise at rehab and by engaging in a home exercise program the patient will reach their goals.               Discharge Exercise Prescription (Final Exercise Prescription Changes):  Exercise Prescription Changes - 01/01/21 1200      Response to Exercise   Blood Pressure (Admit) 162/78    Blood Pressure (Exercise) 174/72    Blood Pressure (Exit) 124/68    Heart Rate (Admit) 78 bpm    Heart Rate (Exercise) 101 bpm    Heart Rate (Exit) 80 bpm    Rating of Perceived Exertion (Exercise) 13    Duration Continue with 30 min of aerobic exercise without signs/symptoms of physical distress.    Intensity THRR unchanged      Progression   Progression Continue to progress workloads to maintain intensity without signs/symptoms of physical  distress.      Resistance Training   Training Prescription Yes    Weight 3 lbs    Reps 10-15      NuStep   Level 1    SPM 98    Minutes 22    METs 2.3      Arm Ergometer   Level 1    RPM 45    Minutes 17    METs 1.5           Nutrition:  Target Goals: Understanding of nutrition guidelines, daily intake of sodium 1500mg , cholesterol 200mg , calories 30% from fat and 7% or less from saturated fats, daily to have 5 or more servings of fruits and vegetables.  Biometrics:  Pre Biometrics - 12/11/20 1424      Pre Biometrics   Height 6\' 1"  (1.854 m)    Weight 103.4 kg    Waist Circumference 44.5 inches    Hip Circumference 46 inches    Waist to Hip Ratio 0.97 %    BMI (Calculated) 30.08    Triceps Skinfold 16 mm    % Body Fat 30.1 %    Grip Strength 34.6 kg    Flexibility 0 in    Single Leg Stand 4.98 seconds            Nutrition Therapy Plan and Nutrition Goals:  Nutrition Therapy & Goals - 12/28/20 0736      Personal Nutrition Goals   Comments We will continue to provide nutritional heart healthy education through handouts.      Intervention Plan   Intervention Nutrition handout(s) given to patient.           Nutrition Assessments:  Nutrition Assessments - 12/11/20 1453      MEDFICTS Scores   Pre Score 63          MEDIFICTS Score Key:  ?70 Need to make dietary changes   40-70 Heart Healthy Diet  ? 40 Therapeutic Level Cholesterol Diet   Picture Your Plate Scores:  <16 Unhealthy dietary  pattern with much room for improvement.  41-50 Dietary pattern unlikely to meet recommendations for good health and room for improvement.  51-60 More healthful dietary pattern, with some room for improvement.   >60 Healthy dietary pattern, although there may be some specific behaviors that could be improved.    Nutrition Goals Re-Evaluation:   Nutrition Goals Discharge (Final Nutrition Goals Re-Evaluation):   Psychosocial: Target Goals:  Acknowledge presence or absence of significant depression and/or stress, maximize coping skills, provide positive support system. Participant is able to verbalize types and ability to use techniques and skills needed for reducing stress and depression.  Initial Review & Psychosocial Screening:  Initial Psych Review & Screening - 12/11/20 1449      Initial Review   Current issues with None Identified      Family Dynamics   Good Support System? Yes    Comments Patient has no psychosocial issues identified. His initial QOL score was 30.28% overall and his PHQ-9 score was 3. He denies any depression, anxiety or stress. He lives alone. He had a daughter and a son that he speaks with everyday and he reports having a special friend he likes to spend time with. He demonstrates a positive outlook about his health and his future. Will continue to monitor.      Barriers   Psychosocial barriers to participate in program There are no identifiable barriers or psychosocial needs.      Screening Interventions   Interventions Encouraged to exercise    Expected Outcomes Long Term goal: The participant improves quality of Life and PHQ9 Scores as seen by post scores and/or verbalization of changes           Quality of Life Scores:  Quality of Life - 12/11/20 1420      Quality of Life   Select Quality of Life      Quality of Life Scores   Health/Function Pre 22.73 %    Socioeconomic Pre 23.21 %    Psych/Spiritual Pre 24.43 %    Family Pre 26.1 %    GLOBAL Pre 23.68 %          Scores of 19 and below usually indicate a poorer quality of life in these areas.  A difference of  2-3 points is a clinically meaningful difference.  A difference of 2-3 points in the total score of the Quality of Life Index has been associated with significant improvement in overall quality of life, self-image, physical symptoms, and general health in studies assessing change in quality of life.  PHQ-9: Recent Review  Flowsheet Data    Depression screen [facility] 2/9 12/11/2020   Decreased Interest 0   Down, Depressed, Hopeless 0   PHQ - 2 Score 0   Altered sleeping 2   Tired, decreased energy 1   Change in appetite 0   Feeling bad or failure about yourself  0   Trouble concentrating 0   Moving slowly or fidgety/restless 0   Suicidal thoughts 0   PHQ-9 Score 3   Difficult doing work/chores Not difficult at all     Interpretation of Total Score  Total Score Depression Severity:  1-4 = Minimal depression, 5-9 = Mild depression, 10-14 = Moderate depression, 15-19 = Moderately severe depression, 20-27 = Severe depression   Psychosocial Evaluation and Intervention:  Psychosocial Evaluation - 12/28/20 0737      Psychosocial Evaluation & Interventions   Interventions Stress management education;Relaxation education;Encouraged to exercise with the program and follow exercise prescription  Comments Patient is new to the program completing 2 sessions. He continues to have no psychosocial issues identified. He is glad to start the program and looks forward to feeling stronger and meetin his goals. Will continue to montior.    Expected Outcomes Patient will have no psychosocial issues identified at discaharge.    Continue Psychosocial Services  No Follow up required           Psychosocial Re-Evaluation:   Psychosocial Discharge (Final Psychosocial Re-Evaluation):   Vocational Rehabilitation: Provide vocational rehab assistance to qualifying candidates.   Vocational Rehab Evaluation & Intervention:  Vocational Rehab - 12/11/20 1455      Initial Vocational Rehab Evaluation & Intervention   Assessment shows need for Vocational Rehabilitation No      Vocational Rehab Re-Evaulation   Comments Patient is retired and is not interested in returning to work. He does not need vocational rehab.           Education: Education Goals: Education classes will be provided on a weekly basis, covering  required topics. Participant will state understanding/return demonstration of topics presented.  Learning Barriers/Preferences:  Learning Barriers/Preferences - 12/11/20 1454      Learning Barriers/Preferences   Learning Barriers Reading    Learning Preferences Audio;Pictoral;Skilled Demonstration           Education Topics: Hypertension, Hypertension Reduction -Define heart disease and high blood pressure. Discus how high blood pressure affects the body and ways to reduce high blood pressure. Flowsheet Row CARDIAC REHAB PHASE II EXERCISE from 12/27/2020 in [facility] [place] CARDIAC REHABILITATION  Date 12/27/20  Educator DF  Instruction Review Code 1- Verbalizes Understanding      Exercise and Your Heart -Discuss why it is important to exercise, the FITT principles of exercise, normal and abnormal responses to exercise, and how to exercise safely.   Angina -Discuss definition of angina, causes of angina, treatment of angina, and how to decrease risk of having angina.   Cardiac Medications -Review what the following cardiac medications are used for, how they affect the body, and side effects that may occur when taking the medications.  Medications include Aspirin, Beta blockers, calcium channel blockers, ACE Inhibitors, angiotensin receptor blockers, diuretics, digoxin, and antihyperlipidemics.   Congestive Heart Failure -Discuss the definition of CHF, how to live with CHF, the signs and symptoms of CHF, and how keep track of weight and sodium intake.   Heart Disease and Intimacy -Discus the effect sexual activity has on the heart, how changes occur during intimacy as we age, and safety during sexual activity.   Smoking Cessation / COPD -Discuss different methods to quit smoking, the health benefits of quitting smoking, and the definition of COPD.   Nutrition I: Fats -Discuss the types of cholesterol, what cholesterol does to the heart, and how cholesterol levels can be  controlled.   Nutrition II: Labels -Discuss the different components of food labels and how to read food label   Heart Parts/Heart Disease and PAD -Discuss the anatomy of the heart, the pathway of blood circulation through the heart, and these are affected by heart disease.   Stress I: Signs and Symptoms -Discuss the causes of stress, how stress may lead to anxiety and depression, and ways to limit stress.   Stress II: Relaxation -Discuss different types of relaxation techniques to limit stress.   Warning Signs of Stroke / TIA -Discuss definition of a stroke, what the signs and symptoms are of a stroke, and how to identify when someone is having  stroke.   Knowledge Questionnaire Score:  Knowledge Questionnaire Score - 12/11/20 1455      Knowledge Questionnaire Score   Pre Score 12/24           Core Components/Risk Factors/Patient Goals at Admission:  Personal Goals and Risk Factors at Admission - 12/11/20 1456      Core Components/Risk Factors/Patient Goals on Admission    Weight Management Yes    Intervention Weight Management/Obesity: Establish reasonable short term and long term weight goals.    Admit Weight 229 lb (103.9 kg)    Goal Weight: Short Term 219 lb (99.3 kg)    Goal Weight: Long Term 224 lb (101.6 kg)    Expected Outcomes Long Term: Adherence to nutrition and physical activity/exercise program aimed toward attainment of established weight goal    Diabetes Yes    Intervention Provide education about signs/symptoms and action to take for hypo/hyperglycemia.;Provide education about proper nutrition, including hydration, and aerobic/resistive exercise prescription along with prescribed medications to achieve blood glucose in normal ranges: Fasting glucose 65-99 mg/dL    Expected Outcomes Short Term: Participant verbalizes understanding of the signs/symptoms and immediate care of hyper/hypoglycemia, proper foot care and importance of medication, aerobic/resistive  exercise and nutrition plan for blood glucose control.;Long Term: Attainment of HbA1C < 7%.    Personal Goal Other Yes    Personal Goal Get stronger; have more energy. Lose weight.    Intervention Patient will attend CR 3 days/week and supplement with exercise 2 days/week.    Expected Outcomes Patient will meet both personal and program goals.           Core Components/Risk Factors/Patient Goals Review:   Goals and Risk Factor Review    Row Name 12/28/20 [id number]             Core Components/Risk Factors/Patient Goals Review   Personal Goals Review Diabetes;Weight Management/Obesity       Review Patient is new to progam completing 2 sessions. He has multiple risk factors for CAD and is participating in CR for risk modification. His last A1C on file was 6.7%. His personal goals for the program are to lose 10 lbs; get stronger; and have more energy. Will continue to monitor for progress.       Expected Outcomes Patient will complete the program meeting his personal and program goals.              Core Components/Risk Factors/Patient Goals at Discharge (Final Review):   Goals and Risk Factor Review - 12/28/20 0738      Core Components/Risk Factors/Patient Goals Review   Personal Goals Review Diabetes;Weight Management/Obesity    Review Patient is new to progam completing 2 sessions. He has multiple risk factors for CAD and is participating in CR for risk modification. His last A1C on file was 6.7%. His personal goals for the program are to lose 10 lbs; get stronger; and have more energy. Will continue to monitor for progress.    Expected Outcomes Patient will complete the program meeting his personal and program goals.           ITP Comments:   Comments: ITP REVIEW Pt is making expected progress toward Cardiac Rehab goals after completing 4 sessions. Recommend continued exercise, life style modification, education, and increased stamina and strength.

## 2021-01-05 ENCOUNTER — Other Ambulatory Visit: Payer: Self-pay

## 2021-01-05 ENCOUNTER — Encounter (HOSPITAL_COMMUNITY)
Admission: RE | Admit: 2021-01-05 | Discharge: 2021-01-05 | Disposition: A | Discharge status: Home / Self Care | Payer: Medicare Other | Source: Ambulatory Visit | Attending: Cardiovascular Disease | Admitting: Cardiovascular Disease

## 2021-01-05 DIAGNOSIS — I214 Non-ST elevation (NSTEMI) myocardial infarction: Secondary | ICD-10-CM

## 2021-01-05 DIAGNOSIS — Z951 Presence of aortocoronary bypass graft: Secondary | ICD-10-CM

## 2021-01-05 NOTE — Progress Notes (Signed)
Daily Session Note  Patient Details  Name: Herbert Torres MRN: [id number] Date of Birth: 03/19/1946 Referring Provider:   Flowsheet Row CARDIAC REHAB PHASE II ORIENTATION from 12/11/2020 in [place]  Referring Provider Dr. Oval Linsey      Encounter Date: 01/05/2021  Check In:  Session Check In - 01/05/21 0950      Check-In   Supervising physician immediately available to respond to emergencies [facility] MD immediately available    Physician(s) Dr. Harl Bowie    Location [facility]    Staff Present Cathren Harsh, MS, Exercise Physiologist;Dalton Kris Mouton, MS, ACSM-CEP, Exercise Physiologist    Virtual Visit No    Medication changes reported     No    Fall or balance concerns reported    No    Tobacco Cessation No Change    Warm-up and Cool-down Performed as group-led instruction    Resistance Training Performed Yes    VAD Patient? No    PAD/SET Patient? No      Pain Assessment   Currently in Pain? No/denies    Pain Score 0-No pain    Multiple Pain Sites No           Capillary Blood Glucose: No results found for this or any previous visit (from the past 24 hour(s)).    Social History   Tobacco Use  Smoking Status Former Smoker  Smokeless Tobacco Never Used  Tobacco Comment   quit over 30 years ago    Goals Met:  Independence with exercise equipment Exercise tolerated well No report of cardiac concerns or symptoms Strength training completed today  Goals Unmet:  Not Applicable  Comments: check out 1030   Dr. Kathie Dike is Medical Director for [facility].

## 2021-01-08 ENCOUNTER — Encounter (HOSPITAL_COMMUNITY): Payer: Medicare Other

## 2021-01-10 ENCOUNTER — Other Ambulatory Visit: Payer: Self-pay

## 2021-01-10 ENCOUNTER — Encounter (HOSPITAL_COMMUNITY)
Admission: RE | Admit: 2021-01-10 | Discharge: 2021-01-10 | Disposition: A | Discharge status: Home / Self Care | Payer: Medicare Other | Source: Ambulatory Visit | Attending: Cardiovascular Disease | Admitting: Cardiovascular Disease

## 2021-01-10 DIAGNOSIS — Z951 Presence of aortocoronary bypass graft: Secondary | ICD-10-CM

## 2021-01-10 DIAGNOSIS — I214 Non-ST elevation (NSTEMI) myocardial infarction: Secondary | ICD-10-CM | POA: Diagnosis not present

## 2021-01-10 NOTE — Progress Notes (Signed)
Daily Session Note  Patient Details  Name: Herbert Torres MRN: [id number] Date of Birth: September 25, 1946 Referring Provider:   Flowsheet Row CARDIAC REHAB PHASE II ORIENTATION from 12/11/2020 in [place]  Referring Provider Dr. Oval Linsey      Encounter Date: 01/10/2021  Check In:  Session Check In - 01/10/21 0930      Check-In   Supervising physician immediately available to respond to emergencies [facility] immediately available    Physician(s) Dr. Domenic Polite    Location [facility]    Staff Present Cathren Harsh, MS, Exercise Physiologist;Dalton Kris Mouton, MS, ACSM-CEP, Exercise Physiologist    Virtual Visit No    Medication changes reported     No    Fall or balance concerns reported    No    Tobacco Cessation No Change    Warm-up and Cool-down Performed as group-led instruction    Resistance Training Performed Yes    VAD Patient? No    PAD/SET Patient? No      Pain Assessment   Currently in Pain? No/denies    Pain Score 0-No pain    Multiple Pain Sites No           Capillary Blood Glucose: No results found for this or any previous visit (from the past 24 hour(s)).    Social History   Tobacco Use  Smoking Status Former Smoker  Smokeless Tobacco Never Used  Tobacco Comment   quit over 30 years ago    Goals Met:  Independence with exercise equipment Exercise tolerated well No report of cardiac concerns or symptoms Strength training completed today  Goals Unmet:  Not Applicable  Comments: check out 1030   Dr. Kathie Dike is Medical Director for [facility] Re[facility].

## 2021-01-12 ENCOUNTER — Encounter (HOSPITAL_COMMUNITY)
Admission: RE | Admit: 2021-01-12 | Discharge: 2021-01-12 | Disposition: A | Discharge status: Home / Self Care | Payer: Medicare Other | Source: Ambulatory Visit | Attending: Cardiovascular Disease | Admitting: Cardiovascular Disease

## 2021-01-12 ENCOUNTER — Other Ambulatory Visit: Payer: Self-pay

## 2021-01-12 DIAGNOSIS — Z951 Presence of aortocoronary bypass graft: Secondary | ICD-10-CM

## 2021-01-12 DIAGNOSIS — I214 Non-ST elevation (NSTEMI) myocardial infarction: Secondary | ICD-10-CM

## 2021-01-12 NOTE — Progress Notes (Signed)
I have reviewed a Home Exercise Prescription with Herbert Torres . [name] is not currently exercising at home.  The patient was advised to walk 2-3 days a week for 30-40 minutes. Patient stated it is hard to find time to exercise at home and we discussed doing 20 minutes at a time a couple times a day.  [name] and I discussed how to progress their exercise prescription.  The patient stated that their goals were to build stamina.  The patient stated that they understand the exercise prescription.  We reviewed exercise guidelines, target heart rate during exercise, RPE Scale, weather conditions, NTG use, endpoints for exercise, warmup and cool down.  Patient is encouraged to come to me with any questions. I will continue to follow up with the patient to assist them with progression and safety.

## 2021-01-12 NOTE — Progress Notes (Signed)
Daily Session Note  Patient Details  Name: SAMWISE ECKARDT MRN: [id number] Date of Birth: 1946/05/31 Referring Provider:   Flowsheet Row CARDIAC REHAB PHASE II ORIENTATION from 12/11/2020 in [place]  Referring Provider Dr. Oval Linsey      Encounter Date: 01/12/2021  Check In:  Session Check In - 01/12/21 0930      Check-In   Supervising physician immediately available to respond to emergencies [facility] immediately available    Physician(s) Domenic Polite    Location [facility]    Staff Present Geanie Cooley, RN;Debra Wynetta Emery, RN, BSN;Madison Audria Nine, MS, Exercise Physiologist;Dalton Kris Mouton, MS, ACSM-CEP, Exercise Physiologist    Virtual Visit No    Medication changes reported     No    Fall or balance concerns reported    No    Tobacco Cessation No Change    Warm-up and Cool-down Performed as group-led instruction    Resistance Training Performed Yes    VAD Patient? No    PAD/SET Patient? No      Pain Assessment   Currently in Pain? No/denies    Pain Score 0-No pain    Multiple Pain Sites No           Capillary Blood Glucose: No results found for this or any previous visit (from the past 24 hour(s)).    Social History   Tobacco Use  Smoking Status Former Smoker  Smokeless Tobacco Never Used  Tobacco Comment   quit over 30 years ago    Goals Met:  Independence with exercise equipment Exercise tolerated well No report of cardiac concerns or symptoms Strength training completed today  Goals Unmet:  Not Applicable  Comments: check out @ 10:30   Dr. Kathie Dike is Medical Director for [facility] Pulmonary Rehab.

## 2021-01-15 ENCOUNTER — Other Ambulatory Visit: Payer: Self-pay

## 2021-01-15 ENCOUNTER — Encounter (HOSPITAL_COMMUNITY)
Admission: RE | Admit: 2021-01-15 | Discharge: 2021-01-15 | Disposition: A | Discharge status: Home / Self Care | Payer: Medicare Other | Source: Ambulatory Visit | Attending: Cardiovascular Disease | Admitting: Cardiovascular Disease

## 2021-01-15 VITALS — Wt 220.2 lb

## 2021-01-15 DIAGNOSIS — I214 Non-ST elevation (NSTEMI) myocardial infarction: Secondary | ICD-10-CM

## 2021-01-15 DIAGNOSIS — Z951 Presence of aortocoronary bypass graft: Secondary | ICD-10-CM

## 2021-01-15 NOTE — Progress Notes (Signed)
Daily Session Note  Patient Details  Name: Herbert Torres MRN: [id number] Date of Birth: 03-13-1946 Referring Provider:   Flowsheet Row CARDIAC REHAB PHASE II ORIENTATION from 12/11/2020 in [place]  Referring Provider Dr. Oval Linsey      Encounter Date: 01/15/2021  Check In:  Session Check In - 01/15/21 0930      Check-In   Supervising physician immediately available to respond to emergencies [facility] immediately available    Physician(s) Dr. Harl Bowie    Location [facility]    Staff Present Aundra Dubin, RN, Bjorn Loser, MS, ACSM-CEP, Exercise Physiologist    Virtual Visit No    Medication changes reported     No    Fall or balance concerns reported    No    Tobacco Cessation No Change    Warm-up and Cool-down Performed as group-led instruction    Resistance Training Performed Yes    VAD Patient? No    PAD/SET Patient? No      Pain Assessment   Currently in Pain? No/denies    Pain Score 0-No pain    Multiple Pain Sites No           Capillary Blood Glucose: No results found for this or any previous visit (from the past 24 hour(s)).    Social History   Tobacco Use  Smoking Status Former Smoker  Smokeless Tobacco Never Used  Tobacco Comment   quit over 30 years ago    Goals Met:  Independence with exercise equipment Exercise tolerated well No report of cardiac concerns or symptoms Strength training completed today  Goals Unmet:  Not Applicable  Comments: checkout time is 6   Dr. Kathie Dike is Medical Director for [facility] Pulmonary Rehab.

## 2021-01-17 ENCOUNTER — Other Ambulatory Visit: Payer: Self-pay

## 2021-01-17 ENCOUNTER — Encounter (HOSPITAL_COMMUNITY)
Admission: RE | Admit: 2021-01-17 | Discharge: 2021-01-17 | Disposition: A | Discharge status: Home / Self Care | Payer: Medicare Other | Source: Ambulatory Visit | Attending: Cardiovascular Disease | Admitting: Cardiovascular Disease

## 2021-01-17 DIAGNOSIS — I214 Non-ST elevation (NSTEMI) myocardial infarction: Secondary | ICD-10-CM

## 2021-01-17 DIAGNOSIS — Z951 Presence of aortocoronary bypass graft: Secondary | ICD-10-CM

## 2021-01-17 NOTE — Progress Notes (Signed)
Daily Session Note  Patient Details  Name: Herbert Torres MRN: [id number] Date of Birth: 09/20/1946 Referring Provider:   Flowsheet Row CARDIAC REHAB PHASE II ORIENTATION from 12/11/2020 in [place]  Referring Provider Dr. Oval Linsey      Encounter Date: 01/17/2021  Check In:  Session Check In - 01/17/21 0930      Check-In   Supervising physician immediately available to respond to emergencies [facility] immediately available    Physician(s) Dr. Domenic Polite    Location [facility]    Staff Present Cathren Harsh, MS, Exercise Physiologist;Dalton Kris Mouton, MS, ACSM-CEP, Exercise Physiologist    Virtual Visit No    Medication changes reported     No    Fall or balance concerns reported    No    Tobacco Cessation No Change    Warm-up and Cool-down Performed as group-led instruction    Resistance Training Performed Yes    VAD Patient? No    PAD/SET Patient? No      Pain Assessment   Currently in Pain? No/denies    Pain Score 0-No pain    Multiple Pain Sites No           Capillary Blood Glucose: No results found for this or any previous visit (from the past 24 hour(s)).    Social History   Tobacco Use  Smoking Status Former Smoker  Smokeless Tobacco Never Used  Tobacco Comment   quit over 30 years ago    Goals Met:  Independence with exercise equipment Exercise tolerated well No report of cardiac concerns or symptoms Strength training completed today  Goals Unmet:  Not Applicable  Comments: Check out 1030   Dr. Kathie Dike is Medical Director for [facility] Pulmonary Rehab.

## 2021-01-19 ENCOUNTER — Other Ambulatory Visit: Payer: Self-pay

## 2021-01-19 ENCOUNTER — Encounter (HOSPITAL_COMMUNITY)
Admission: RE | Admit: 2021-01-19 | Discharge: 2021-01-19 | Disposition: A | Discharge status: Home / Self Care | Payer: Medicare Other | Source: Ambulatory Visit | Attending: Cardiovascular Disease | Admitting: Cardiovascular Disease

## 2021-01-19 DIAGNOSIS — Z951 Presence of aortocoronary bypass graft: Secondary | ICD-10-CM

## 2021-01-19 DIAGNOSIS — I214 Non-ST elevation (NSTEMI) myocardial infarction: Secondary | ICD-10-CM

## 2021-01-19 NOTE — Progress Notes (Signed)
Daily Session Note  Patient Details  Name: Herbert Torres MRN: [id number] Date of Birth: Dec 13, 1946 Referring Provider:   Flowsheet Row CARDIAC REHAB PHASE II ORIENTATION from 12/11/2020 in [place]  Referring Provider Dr. Oval Linsey      Encounter Date: 01/19/2021  Check In:  Session Check In - 01/19/21 0930      Check-In   Supervising physician immediately available to respond to emergencies [facility] immediately available    Physician(s) Dr. Harl Bowie    Location [facility]    Staff Present Cathren Harsh, MS, Exercise Physiologist;Dalton Kris Mouton, MS, ACSM-CEP, Exercise Physiologist    Virtual Visit No    Medication changes reported     No    Fall or balance concerns reported    No    Tobacco Cessation No Change    Warm-up and Cool-down Performed as group-led instruction    Resistance Training Performed Yes    VAD Patient? No    PAD/SET Patient? No      Pain Assessment   Currently in Pain? No/denies    Pain Score 0-No pain    Multiple Pain Sites No           Capillary Blood Glucose: No results found for this or any previous visit (from the past 24 hour(s)).    Social History   Tobacco Use  Smoking Status Former Smoker  Smokeless Tobacco Never Used  Tobacco Comment   quit over 30 years ago    Goals Met:  Independence with exercise equipment Exercise tolerated well No report of cardiac concerns or symptoms Strength training completed today  Goals Unmet:  Not Applicable  Comments: check out 1030   Dr. Kathie Dike is Medical Director for [facility] Pul[facility] Re[facility].

## 2021-01-22 ENCOUNTER — Encounter (HOSPITAL_COMMUNITY): Payer: Medicare Other

## 2021-01-24 ENCOUNTER — Encounter (HOSPITAL_COMMUNITY)
Admission: RE | Admit: 2021-01-24 | Discharge: 2021-01-24 | Disposition: A | Discharge status: Home / Self Care | Payer: Medicare Other | Source: Ambulatory Visit | Attending: Cardiovascular Disease | Admitting: Cardiovascular Disease

## 2021-01-24 ENCOUNTER — Other Ambulatory Visit: Payer: Self-pay | Admitting: Physician Assistant

## 2021-01-24 ENCOUNTER — Other Ambulatory Visit: Payer: Self-pay

## 2021-01-24 DIAGNOSIS — I214 Non-ST elevation (NSTEMI) myocardial infarction: Secondary | ICD-10-CM | POA: Insufficient documentation

## 2021-01-24 DIAGNOSIS — Z951 Presence of aortocoronary bypass graft: Secondary | ICD-10-CM | POA: Insufficient documentation

## 2021-01-24 NOTE — Progress Notes (Signed)
Daily Session Note  Patient Details  Name: Herbert Torres MRN: [id number] Date of Birth: 12/12/1946 Referring Provider:   Flowsheet Row CARDIAC REHAB PHASE II ORIENTATION from 12/11/2020 in [place] [facility]  Referring Provider Dr. Kirkpatrick      Encounter Date: 01/24/2021  Check In:  Session Check In - 01/24/21 0930      Check-In   Supervising physician immediately available to respond to emergencies [facility] immediately available    Physician(s) Dr. McDowell    Location [facility]    Staff Present Madison Karch, MS, Exercise Physiologist;Dalton Fletcher, MS, ACSM-CEP, Exercise Physiologist    Virtual Visit No    Medication changes reported     No    Fall or balance concerns reported    No    Tobacco Cessation No Change    Warm-up and Cool-down Performed as group-led instruction    Resistance Training Performed Yes    VAD Patient? No    PAD/SET Patient? No      Pain Assessment   Currently in Pain? No/denies    Pain Score 0-No pain    Multiple Pain Sites No           Capillary Blood Glucose: No results found for this or any previous visit (from the past 24 hour(s)).    Social History   Tobacco Use  Smoking Status Former Smoker  Smokeless Tobacco Never Used  Tobacco Comment   quit over 30 years ago    Goals Met:  Independence with exercise equipment Exercise tolerated well No report of cardiac concerns or symptoms Strength training completed today  Goals Unmet:  Not Applicable  Comments: check out 1030   Dr. Jehanzeb Memon is Medical Director for [facility]. 

## 2021-01-26 ENCOUNTER — Telehealth: Payer: Self-pay | Admitting: General Practice

## 2021-01-26 ENCOUNTER — Other Ambulatory Visit: Payer: Self-pay

## 2021-01-26 ENCOUNTER — Encounter (HOSPITAL_COMMUNITY)
Admission: RE | Admit: 2021-01-26 | Discharge: 2021-01-26 | Disposition: A | Discharge status: Home / Self Care | Payer: Medicare Other | Source: Ambulatory Visit | Attending: Cardiovascular Disease | Admitting: Cardiovascular Disease

## 2021-01-26 DIAGNOSIS — Z951 Presence of aortocoronary bypass graft: Secondary | ICD-10-CM

## 2021-01-26 DIAGNOSIS — I214 Non-ST elevation (NSTEMI) myocardial infarction: Secondary | ICD-10-CM

## 2021-01-26 MED ORDER — EZETIMIBE 10 MG PO TABS
10.0000 mg | ORAL_TABLET | Freq: Every day | ORAL | 3 refills | Status: DC
Start: 2021-01-26 — End: 2021-04-23

## 2021-01-26 NOTE — Telephone Encounter (Signed)
*  STAT* If patient is at the pharmacy, call can be transferred to refill team.   1. Which medications need to be refilled? (please list name of each medication and dose if known) ezetimibe (ZETIA) 10 MG tablet  2. Which pharmacy/location (including street and city if local pharmacy) is medication to be sent to? [id number] - [place], [place] - [address]  3. Do they need a 30 day or 90 day supply? 90 day supply  Pt is completely out of medication.

## 2021-01-26 NOTE — Progress Notes (Signed)
Daily Session Note  Patient Details  Name: Herbert Torres MRN: [id number] Date of Birth: [birth year] Referring Provider:   Flowsheet Row CARDIAC REHAB PHASE II ORIENTATION from 12/11/2020 in [place]  Referring Provider Dr. Oval Linsey      Encounter Date: 01/26/2021  Check In:  Session Check In - 01/26/21 0930      Check-In   Supervising physician immediately available to respond to emergencies [facility] MD immediately available    Physician(s) Dr. Harl Bowie    Location [facility]    Staff Present Cathren Harsh, MS, Exercise Physiologist;Dalton Kris Mouton, MS, ACSM-CEP, Exercise Physiologist    Virtual Visit No    Medication changes reported     No    Fall or balance concerns reported    No    Tobacco Cessation No Change    Warm-up and Cool-down Performed as group-led instruction    Resistance Training Performed Yes    VAD Patient? No    PAD/SET Patient? No      Pain Assessment   Currently in Pain? No/denies    Pain Score 0-No pain    Multiple Pain Sites No           Capillary Blood Glucose: No results found for this or any previous visit (from the past 24 hour(s)).    Social History   Tobacco Use  Smoking Status Former Smoker  Smokeless Tobacco Never Used  Tobacco Comment   quit over 30 years ago    Goals Met:  Independence with exercise equipment Exercise tolerated well No report of cardiac concerns or symptoms Strength training completed today  Goals Unmet:  Not Applicable  Comments: checkout 10   Dr. Kathie Dike is Medical Director for [facility] Pulmonary Rehab.

## 2021-01-29 ENCOUNTER — Encounter (HOSPITAL_COMMUNITY): Payer: Medicare Other

## 2021-01-31 ENCOUNTER — Encounter (HOSPITAL_COMMUNITY)
Admission: RE | Admit: 2021-01-31 | Discharge: 2021-01-31 | Disposition: A | Discharge status: Home / Self Care | Payer: Medicare Other | Source: Ambulatory Visit | Attending: Cardiovascular Disease | Admitting: Cardiovascular Disease

## 2021-01-31 ENCOUNTER — Other Ambulatory Visit: Payer: Self-pay

## 2021-01-31 DIAGNOSIS — I214 Non-ST elevation (NSTEMI) myocardial infarction: Secondary | ICD-10-CM | POA: Diagnosis not present

## 2021-01-31 DIAGNOSIS — Z951 Presence of aortocoronary bypass graft: Secondary | ICD-10-CM

## 2021-01-31 NOTE — Progress Notes (Signed)
Cardiac Individual Treatment Plan  Patient Details  Name: Herbert Torres MRN: [id number] Date of Birth: 21-Feb-1946 Referring Provider:   Flowsheet Row CARDIAC REHAB PHASE II ORIENTATION from 12/11/2020 in [facility] CARDIAC REHABILITATION  Referring Provider Dr. Duke Salvia      Initial Encounter Date:  Flowsheet Row CARDIAC REHAB PHASE II ORIENTATION from 12/11/2020 in [place] CARDIAC REHABILITATION  Date 12/11/20      Visit Diagnosis: NSTEMI (non-ST elevated myocardial infarction) (HCC)  S/P CABG x 4  Patient's Home Medications on Admission:  Current Outpatient Medications:  .  amiodarone (PACERONE) 200 MG tablet, Take 2 tablets (400 mg total) by mouth 2 (two) times daily. For 5 days, then decrease to 200 mg BID x 7 days, then decrease to 200 mg daily (Patient taking differently: Take 200 mg by mouth 2 (two) times daily. For 5 days, then decrease to 200 mg BID x 7 days, then decrease to 200 mg daily), Disp: 90 tablet, Rfl: 1 .  apixaban (ELIQUIS) 5 MG TABS tablet, Take 5 mg by mouth in the morning and at bedtime., Disp: , Rfl:  .  aspirin EC 81 MG EC tablet, Take 1 tablet (81 mg total) by mouth daily. Swallow whole., Disp: 30 tablet, Rfl: 11 .  carvedilol (COREG) 6.25 MG tablet, Take 1 tablet (6.25 mg total) by mouth 2 (two) times daily with a meal., Disp: 60 tablet, Rfl: 3 .  dapagliflozin propanediol (FARXIGA) 10 MG TABS tablet, Take 1 tablet (10 mg total) by mouth daily before breakfast., Disp: 30 tablet, Rfl: 3 .  digoxin (LANOXIN) 0.25 MG tablet, Take 1 tablet (0.25 mg total) by mouth daily., Disp: 30 tablet, Rfl: 3 .  ezetimibe (ZETIA) 10 MG tablet, Take 1 tablet (10 mg total) by mouth daily., Disp: 30 tablet, Rfl: 3 .  metFORMIN (GLUCOPHAGE) 1000 MG tablet, Take 1,000 mg by mouth 2 (two) times daily., Disp: , Rfl: 4 .  Omega-3 Fatty Acids ([employer] NATURAL FISH OIL) 1000 MG CAPS, Take 1 capsule by mouth 2 (two) times daily., Disp: , Rfl: 5 .  omeprazole (PRILOSEC) 20 MG capsule,  Take 20 mg by mouth daily., Disp: , Rfl: 3 .  rosuvastatin (CRESTOR) 20 MG tablet, Take 1 tablet (20 mg total) by mouth daily., Disp: 30 tablet, Rfl: 3  Past Medical History: Past Medical History:  Diagnosis Date  . Coronary artery disease   . Diabetes mellitus without complication (HCC)   . GERD (gastroesophageal reflux disease)   . HOH (hard of hearing)    AIDS  . Hyperlipidemia   . Hypertension     Tobacco Use: Social History   Tobacco Use  Smoking Status Former Smoker  Smokeless Tobacco Never Used  Tobacco Comment   quit over 30 years ago    Labs: Recent Hydrographic surveyor    Labs for ITP Cardiac and Pulmonary Rehab Latest Ref Rng & Units 10/18/2020 10/18/2020 10/18/2020 10/18/2020 10/19/2020   Cholestrol 0 - 200 mg/dL - - - - -   LDLCALC 0 - 99 mg/dL - - - - -   HDL >13 mg/dL - - - - -   Trlycerides <150 mg/dL - - - - -   Hemoglobin A1c 4.8 - 5.6 % - - - - -   PHART 7.350 - 7.450 - 7.386 7.321(L) 7.335(L) 7.334(L)   PCO2ART 32.0 - 48.0 mmHg - 39.0 42.1 42.6 38.9   HCO3 20.0 - 28.0 mmol/L - 23.5 21.7 22.6 20.5   TCO2 22 - 32 mmol/L  24 25 23 24 22    ACIDBASEDEF 0.0 - 2.0 mmol/L - 2.0 4.0(H) 3.0(H) 5.0(H)   O2SAT % - 94.0 94.0 92.0 96.0      Capillary Blood Glucose: Lab Results  Component Value Date   GLUCAP 130 (H) 10/29/2020   GLUCAP 90 10/28/2020   GLUCAP 213 (H) 10/28/2020   GLUCAP 133 (H) 10/28/2020   GLUCAP 169 (H) 10/28/2020    POCT Glucose    Row Name 01/01/21 1248 01/29/21 1137           POCT Blood Glucose   Pre-Exercise 156 mg/dL --      Pre-Exercise #2 -- 139 mg/dL             Exercise Target Goals: Exercise Program Goal: Individual exercise prescription set using results from initial 6 min walk test and THRR while considering  patient's activity barriers and safety.   Exercise Prescription Goal: Starting with aerobic activity 30 plus minutes a day, 3 days per week for initial exercise prescription. Provide home exercise  prescription and guidelines that participant acknowledges understanding prior to discharge.  Activity Barriers & Risk Stratification:  Activity Barriers & Cardiac Risk Stratification - 12/11/20 1304      Activity Barriers & Cardiac Risk Stratification   Activity Barriers None    Cardiac Risk Stratification High           6 Minute Walk:  6 Minute Walk    Row Name 12/11/20 1421         6 Minute Walk   Phase Initial     Distance 1200 feet     Walk Time 6 minutes     # of Rest Breaks 0     MPH 2.27     METS 2.41     RPE 14     VO2 Peak 8.44     Symptoms No     Resting HR 60 bpm     Resting BP 140/50     Resting Oxygen Saturation  95 %     Exercise Oxygen Saturation  during 6 min walk 97 %     Max Ex. HR 80 bpm     Max Ex. BP 180/72     2 Minute Post BP 156/64            Oxygen Initial Assessment:   Oxygen Re-Evaluation:   Oxygen Discharge (Final Oxygen Re-Evaluation):   Initial Exercise Prescription:  Initial Exercise Prescription - 12/11/20 1400      Date of Initial Exercise RX and Referring Provider   Date 12/11/20    Referring Provider Dr. Duke Salvia    Expected Discharge Date 03/16/21      NuStep   Level 1    SPM 80    Minutes 22      Arm Ergometer   Level 1    RPM 60    Minutes 17      Prescription Details   Frequency (times per week) 2    Duration Progress to 30 minutes of continuous aerobic without signs/symptoms of physical distress      Intensity   THRR 40-80% of Max Heartrate 58-115    Ratings of Perceived Exertion 11-13    Perceived Dyspnea 0-4      Resistance Training   Training Prescription Yes    Weight 3 lbs    Reps 10-15           Perform Capillary Blood Glucose checks as needed.  Exercise Prescription Changes:   Exercise  Prescription Changes    Row Name 01/01/21 1200 01/12/21 1000 01/15/21 1300 01/26/21 0930       Response to Exercise   Blood Pressure (Admit) 162/78 -- 140/50 110/52    Blood Pressure (Exercise)  174/72 -- 130/74 138/64    Blood Pressure (Exit) 124/68 -- 128/60 130/62    Heart Rate (Admit) 78 bpm -- 63 bpm 65 bpm    Heart Rate (Exercise) 101 bpm -- 86 bpm 90 bpm    Heart Rate (Exit) 80 bpm -- 70 bpm 74 bpm    Rating of Perceived Exertion (Exercise) 13 -- 16 15    Duration Continue with 30 min of aerobic exercise without signs/symptoms of physical distress. -- Continue with 30 min of aerobic exercise without signs/symptoms of physical distress. Continue with 30 min of aerobic exercise without signs/symptoms of physical distress.    Intensity THRR unchanged -- THRR unchanged THRR unchanged         Progression   Progression Continue to progress workloads to maintain intensity without signs/symptoms of physical distress. -- Continue to progress workloads to maintain intensity without signs/symptoms of physical distress. Continue to progress workloads to maintain intensity without signs/symptoms of physical distress.         Resistance Training   Training Prescription Yes -- Yes Yes    Weight 3 lbs -- 3 lbs 3 lbs    Reps 10-15 -- 10-15 10-15    Time -- -- 10 Minutes 10 Minutes         NuStep   Level 1 -- 1 2    SPM 98 -- 101 101    Minutes 22 -- 17 17    METs 2.3 -- 2.4 2.3         Arm Ergometer   Level 1 -- 1 2    RPM 45 -- 46 53    Minutes 17 -- 22 22    METs 1.5 -- 1.8 2         Home Exercise Plan   Plans to continue exercise at -- Home (comment) -- --    Frequency -- Add 2 additional days to program exercise sessions. -- --    Initial Home Exercises Provided -- 01/12/21 -- --           Exercise Comments:   Exercise Comments    Row Name 12/27/20 1045 01/12/21 1034         Exercise Comments Patient completed first day of rehab. He tolerated exercise well with no complaints. He said that he enjoyed the session and looks forward to coming back and completing the program. Reviewed home exercise program with patient. He will start walking at home and start doing  some light resistance training.             Exercise Goals and Review:   Exercise Goals    Row Name 12/11/20 1424 01/01/21 1248 01/29/21 1135         Exercise Goals   Increase Physical Activity Yes Yes Yes     Intervention Provide advice, education, support and counseling about physical activity/exercise needs.;Develop an individualized exercise prescription for aerobic and resistive training based on initial evaluation findings, risk stratification, comorbidities and participant's personal goals. Provide advice, education, support and counseling about physical activity/exercise needs.;Develop an individualized exercise prescription for aerobic and resistive training based on initial evaluation findings, risk stratification, comorbidities and participant's personal goals. Provide advice, education, support and counseling about physical activity/exercise needs.;Develop an individualized exercise prescription for aerobic  and resistive training based on initial evaluation findings, risk stratification, comorbidities and participant's personal goals.     Expected Outcomes Short Term: Attend rehab on a regular basis to increase amount of physical activity.;Long Term: Add in home exercise to make exercise part of routine and to increase amount of physical activity.;Long Term: Exercising regularly at least 3-5 days a week. Short Term: Attend rehab on a regular basis to increase amount of physical activity.;Long Term: Add in home exercise to make exercise part of routine and to increase amount of physical activity.;Long Term: Exercising regularly at least 3-5 days a week. Short Term: Attend rehab on a regular basis to increase amount of physical activity.;Long Term: Add in home exercise to make exercise part of routine and to increase amount of physical activity.;Long Term: Exercising regularly at least 3-5 days a week.     Increase Strength and Stamina Yes Yes Yes     Intervention Provide advice,  education, support and counseling about physical activity/exercise needs.;Develop an individualized exercise prescription for aerobic and resistive training based on initial evaluation findings, risk stratification, comorbidities and participant's personal goals. Provide advice, education, support and counseling about physical activity/exercise needs.;Develop an individualized exercise prescription for aerobic and resistive training based on initial evaluation findings, risk stratification, comorbidities and participant's personal goals. Provide advice, education, support and counseling about physical activity/exercise needs.;Develop an individualized exercise prescription for aerobic and resistive training based on initial evaluation findings, risk stratification, comorbidities and participant's personal goals.     Expected Outcomes Short Term: Increase workloads from initial exercise prescription for resistance, speed, and METs.;Short Term: Perform resistance training exercises routinely during rehab and add in resistance training at home;Long Term: Improve cardiorespiratory fitness, muscular endurance and strength as measured by increased METs and functional capacity ( ) Short Term: Increase workloads from initial exercise prescription for resistance, speed, and METs.;Short Term: Perform resistance training exercises routinely during rehab and add in resistance training at home;Long Term: Improve cardiorespiratory fitness, muscular endurance and strength as measured by increased METs and functional capacity ( ) Short Term: Increase workloads from initial exercise prescription for resistance, speed, and METs.;Short Term: Perform resistance training exercises routinely during rehab and add in resistance training at home;Long Term: Improve cardiorespiratory fitness, muscular endurance and strength as measured by increased METs and functional capacity ( )     Able to understand and use rate of perceived  exertion (RPE) scale Yes Yes Yes     Intervention Provide education and explanation on how to use RPE scale Provide education and explanation on how to use RPE scale Provide education and explanation on how to use RPE scale     Expected Outcomes Short Term: Able to use RPE daily in rehab to express subjective intensity level;Long Term:  Able to use RPE to guide intensity level when exercising independently Short Term: Able to use RPE daily in rehab to express subjective intensity level;Long Term:  Able to use RPE to guide intensity level when exercising independently Short Term: Able to use RPE daily in rehab to express subjective intensity level;Long Term:  Able to use RPE to guide intensity level when exercising independently     Knowledge and understanding of Target Heart Rate Range (THRR) Yes Yes Yes     Intervention Provide education and explanation of THRR including how the numbers were predicted and where they are located for reference Provide education and explanation of THRR including how the numbers were predicted and where they are located for reference Provide education and explanation of  THRR including how the numbers were predicted and where they are located for reference     Expected Outcomes Short Term: Able to state/look up THRR;Long Term: Able to use THRR to govern intensity when exercising independently;Short Term: Able to use daily as guideline for intensity in rehab Short Term: Able to state/look up THRR;Long Term: Able to use THRR to govern intensity when exercising independently;Short Term: Able to use daily as guideline for intensity in rehab Short Term: Able to state/look up THRR;Long Term: Able to use THRR to govern intensity when exercising independently;Short Term: Able to use daily as guideline for intensity in rehab     Able to check pulse independently Yes Yes Yes     Intervention Provide education and demonstration on how to check pulse in carotid and radial arteries.;Review  the importance of being able to check your own pulse for safety during independent exercise Provide education and demonstration on how to check pulse in carotid and radial arteries.;Review the importance of being able to check your own pulse for safety during independent exercise Provide education and demonstration on how to check pulse in carotid and radial arteries.;Review the importance of being able to check your own pulse for safety during independent exercise     Expected Outcomes Short Term: Able to explain why pulse checking is important during independent exercise;Long Term: Able to check pulse independently and accurately Short Term: Able to explain why pulse checking is important during independent exercise;Long Term: Able to check pulse independently and accurately Short Term: Able to explain why pulse checking is important during independent exercise;Long Term: Able to check pulse independently and accurately     Understanding of Exercise Prescription Yes Yes Yes     Intervention Provide education, explanation, and written materials on patient's individual exercise prescription Provide education, explanation, and written materials on patient's individual exercise prescription Provide education, explanation, and written materials on patient's individual exercise prescription     Expected Outcomes Short Term: Able to explain program exercise prescription;Long Term: Able to explain home exercise prescription to exercise independently Short Term: Able to explain program exercise prescription;Long Term: Able to explain home exercise prescription to exercise independently Short Term: Able to explain program exercise prescription;Long Term: Able to explain home exercise prescription to exercise independently            Exercise Goals Re-Evaluation :  Exercise Goals Re-Evaluation    Row Name 01/01/21 1248 01/29/21 1135           Exercise Goal Re-Evaluation   Exercise Goals Review Increase  Physical Activity;Increase Strength and Stamina;Able to understand and use rate of perceived exertion (RPE) scale;Knowledge and understanding of Target Heart Rate Range (THRR);Able to check pulse independently;Understanding of Exercise Prescription Increase Physical Activity;Increase Strength and Stamina;Able to understand and use rate of perceived exertion (RPE) scale;Knowledge and understanding of Target Heart Rate Range (THRR);Able to check pulse independently;Understanding of Exercise Prescription      Comments Pt has attended 3 exercise sessions. He has a positive attitude and works hard while he is in rehab. He is currently exercising at 2.3 METs on the stepper. His BP has been elevated at both rest and during exercise so far. If this continues to be a trend, we will inform his cardiologist. Will continue to monitor and progress as able. Pt has attended 11 exercise sessions. He continues to push himself in rehab and has been able to increase his workloads. His resting BP has came down to within normal limits. He curretnly  exercises at 2.3 METs on the stepper. Will continue to monitor and progress as able.      Expected Outcomes Through exercise at rehab and by engaging in a home exercise program the patient will reach their goals. Through exercise at rehab and by engaging in a home exercise program the patient will reach their goals.              Discharge Exercise Prescription (Final Exercise Prescription Changes):  Exercise Prescription Changes - 01/26/21 0930      Response to Exercise   Blood Pressure (Admit) 110/52    Blood Pressure (Exercise) 138/64    Blood Pressure (Exit) 130/62    Heart Rate (Admit) 65 bpm    Heart Rate (Exercise) 90 bpm    Heart Rate (Exit) 74 bpm    Rating of Perceived Exertion (Exercise) 15    Duration Continue with 30 min of aerobic exercise without signs/symptoms of physical distress.    Intensity THRR unchanged      Progression   Progression Continue to  progress workloads to maintain intensity without signs/symptoms of physical distress.      Resistance Training   Training Prescription Yes    Weight 3 lbs    Reps 10-15    Time 10 Minutes      NuStep   Level 2    SPM 101    Minutes 17    METs 2.3      Arm Ergometer   Level 2    RPM 53    Minutes 22    METs 2           Nutrition:  Target Goals: Understanding of nutrition guidelines, daily intake of sodium 1500mg , cholesterol 200mg , calories 30% from fat and 7% or less from saturated fats, daily to have 5 or more servings of fruits and vegetables.  Biometrics:  Pre Biometrics - 01/15/21 1337      Pre Biometrics   Weight 99.9 kg            Nutrition Therapy Plan and Nutrition Goals:  Nutrition Therapy & Goals - 01/24/21 0853      Personal Nutrition Goals   Comments We will continue to provide nutritional heart healthy education through handouts.      Intervention Plan   Intervention Nutrition handout(s) given to patient.           Nutrition Assessments:  Nutrition Assessments - 12/11/20 1453      MEDFICTS Scores   Pre Score 63          MEDIFICTS Score Key:  ?70 Need to make dietary changes   40-70 Heart Healthy Diet  ? 40 Therapeutic Level Cholesterol Diet   Picture Your Plate Scores:  <16 Unhealthy dietary pattern with much room for improvement.  41-50 Dietary pattern unlikely to meet recommendations for good health and room for improvement.  51-60 More healthful dietary pattern, with some room for improvement.   >60 Healthy dietary pattern, although there may be some specific behaviors that could be improved.    Nutrition Goals Re-Evaluation:   Nutrition Goals Discharge (Final Nutrition Goals Re-Evaluation):   Psychosocial: Target Goals: Acknowledge presence or absence of significant depression and/or stress, maximize coping skills, provide positive support system. Participant is able to verbalize types and ability to use  techniques and skills needed for reducing stress and depression.  Initial Review & Psychosocial Screening:  Initial Psych Review & Screening - 12/11/20 1449      Initial Review  Current issues with None Identified      Family Dynamics   Good Support System? Yes    Comments Patient has no psychosocial issues identified. His initial QOL score was 30.28% overall and his PHQ-9 score was 3. He denies any depression, anxiety or stress. He lives alone. He had a daughter and a son that he speaks with everyday and he reports having a special friend he likes to spend time with. He demonstrates a positive outlook about his health and his future. Will continue to monitor.      Barriers   Psychosocial barriers to participate in program There are no identifiable barriers or psychosocial needs.      Screening Interventions   Interventions Encouraged to exercise    Expected Outcomes Long Term goal: The participant improves quality of Life and PHQ9 Scores as seen by post scores and/or verbalization of changes           Quality of Life Scores:  Quality of Life - 12/11/20 1420      Quality of Life   Select Quality of Life      Quality of Life Scores   Health/Function Pre 22.73 %    Socioeconomic Pre 23.21 %    Psych/Spiritual Pre 24.43 %    Family Pre 26.1 %    GLOBAL Pre 23.68 %          Scores of 19 and below usually indicate a poorer quality of life in these areas.  A difference of  2-3 points is a clinically meaningful difference.  A difference of 2-3 points in the total score of the Quality of Life Index has been associated with significant improvement in overall quality of life, self-image, physical symptoms, and general health in studies assessing change in quality of life.  PHQ-9: Recent Review Flowsheet Data    Depression screen [facility] 2/9 12/11/2020   Decreased Interest 0   Down, Depressed, Hopeless 0   PHQ - 2 Score 0   Altered sleeping 2   Tired, decreased energy 1   Change in  appetite 0   Feeling bad or failure about yourself  0   Trouble concentrating 0   Moving slowly or fidgety/restless 0   Suicidal thoughts 0   PHQ-9 Score 3   Difficult doing work/chores Not difficult at all     Interpretation of Total Score  Total Score Depression Severity:  1-4 = Minimal depression, 5-9 = Mild depression, 10-14 = Moderate depression, 15-19 = Moderately severe depression, 20-27 = Severe depression   Psychosocial Evaluation and Intervention:  Psychosocial Evaluation - 12/28/20 0737      Psychosocial Evaluation & Interventions   Interventions Stress management education;Relaxation education;Encouraged to exercise with the program and follow exercise prescription    Comments Patient is new to the program completing 2 sessions. He continues to have no psychosocial issues identified. He is glad to start the program and looks forward to feeling stronger and meetin his goals. Will continue to montior.    Expected Outcomes Patient will have no psychosocial issues identified at discaharge.    Continue Psychosocial Services  No Follow up required           Psychosocial Re-Evaluation:  Psychosocial Re-Evaluation    Row Name 01/24/21 [phone]             Psychosocial Re-Evaluation   Current issues with None Identified       Comments Patient has completed 10 session and continues to have no psychosical issues identified.  He continues to demonstate a positive attitude and is interactive with staff and others in his class. His bother passed away a few days ago and he have been out. Will continue to monitor.       Expected Outcomes Patient will have no psychosocial issues identified at discharge.       Interventions Stress management education;Encouraged to attend Cardiac Rehabilitation for the exercise;Relaxation education       Continue Psychosocial Services  No Follow up required              Psychosocial Discharge (Final Psychosocial Re-Evaluation):  Psychosocial  Re-Evaluation - 01/24/21 2694      Psychosocial Re-Evaluation   Current issues with None Identified    Comments Patient has completed 10 session and continues to have no psychosical issues identified. He continues to demonstate a positive attitude and is interactive with staff and others in his class. His bother passed away a few days ago and he have been out. Will continue to monitor.    Expected Outcomes Patient will have no psychosocial issues identified at discharge.    Interventions Stress management education;Encouraged to attend Cardiac Rehabilitation for the exercise;Relaxation education    Continue Psychosocial Services  No Follow up required           Vocational Rehabilitation: Provide vocational rehab assistance to qualifying candidates.   Vocational Rehab Evaluation & Intervention:  Vocational Rehab - 12/11/20 1455      Initial Vocational Rehab Evaluation & Intervention   Assessment shows need for Vocational Rehabilitation No      Vocational Rehab Re-Evaulation   Comments Patient is retired and is not interested in returning to work. He does not need vocational rehab.           Education: Education Goals: Education classes will be provided on a weekly basis, covering required topics. Participant will state understanding/return demonstration of topics presented.  Learning Barriers/Preferences:  Learning Barriers/Preferences - 12/11/20 1454      Learning Barriers/Preferences   Learning Barriers Reading    Learning Preferences Audio;Pictoral;Skilled Demonstration           Education Topics: Hypertension, Hypertension Reduction -Define heart disease and high blood pressure. Discus how high blood pressure affects the body and ways to reduce high blood pressure. Flowsheet Row CARDIAC REHAB PHASE II EXERCISE from 01/24/2021 in [place] C[facility]IAC [facility]HAB[facility]  Date 12/27/20  Educator DF  Instruction Review Code 1- Verbalizes Understanding      Exercise and  Your Heart -Discuss why it is important to exercise, the FITT principles of exercise, normal and abnormal responses to exercise, and how to exercise safely.   Angina -Discuss definition of angina, causes of angina, treatment of angina, and how to decrease risk of having angina. Flowsheet Row CARDIAC REHAB PHASE II EXERCISE from 01/24/2021 in [place] C[facility]AC REHAB[facility]  Date 01/10/21  Educator [facility]  Instruction Review Code 1- Verbalizes Understanding      Cardiac Medications -Review what the following cardiac medications are used for, how they affect the body, and side effects that may occur when taking the medications.  Medications include Aspirin, Beta blockers, calcium channel blockers, ACE Inhibitors, angiotensin receptor blockers, diuretics, digoxin, and antihyperlipidemics. Flowsheet Row CARDIAC REHAB PHASE II EXERCISE from 01/24/2021 in [place] CARDIAC REHABILITATION  Date 01/17/21  Educator [facility]  Instruction Review Code 2- Demonstrated Understanding      Congestive Heart Failure -Discuss the definition of CHF, how to live with CHF, the signs and symptoms of CHF, and  how keep track of weight and sodium intake. Flowsheet Row CARDIAC REHAB PHASE II EXERCISE from 01/24/2021 in [place] CARDIAC REHABILITATION  Date 01/24/21  Educator mk  Instruction Review Code 2- Demonstrated Understanding      Heart Disease and Intimacy -Discus the effect sexual activity has on the heart, how changes occur during intimacy as we age, and safety during sexual activity.   Smoking Cessation / COPD -Discuss different methods to quit smoking, the health benefits of quitting smoking, and the definition of COPD.   Nutrition I: Fats -Discuss the types of cholesterol, what cholesterol does to the heart, and how cholesterol levels can be controlled.   Nutrition II: Labels -Discuss the different components of food labels and how to read food label   Heart Parts/Heart Disease and PAD -Discuss  the anatomy of the heart, the pathway of blood circulation through the heart, and these are affected by heart disease.   Stress I: Signs and Symptoms -Discuss the causes of stress, how stress may lead to anxiety and depression, and ways to limit stress.   Stress II: Relaxation -Discuss different types of relaxation techniques to limit stress.   Warning Signs of Stroke / TIA -Discuss definition of a stroke, what the signs and symptoms are of a stroke, and how to identify when someone is having stroke.   Knowledge Questionnaire Score:  Knowledge Questionnaire Score - 12/11/20 1455      Knowledge Questionnaire Score   Pre Score 12/24           Core Components/Risk Factors/Patient Goals at Admission:  Personal Goals and Risk Factors at Admission - 12/11/20 1456      Core Components/Risk Factors/Patient Goals on Admission    Weight Management Yes    Intervention Weight Management/Obesity: Establish reasonable short term and long term weight goals.    Admit Weight 229 lb (103.9 kg)    Goal Weight: Short Term 219 lb (99.3 kg)    Goal Weight: Long Term 224 lb (101.6 kg)    Expected Outcomes Long Term: Adherence to nutrition and physical activity/exercise program aimed toward attainment of established weight goal    Diabetes Yes    Intervention Provide education about signs/symptoms and action to take for hypo/hyperglycemia.;Provide education about proper nutrition, including hydration, and aerobic/resistive exercise prescription along with prescribed medications to achieve blood glucose in normal ranges: Fasting glucose 65-99 mg/dL    Expected Outcomes Short Term: Participant verbalizes understanding of the signs/symptoms and immediate care of hyper/hypoglycemia, proper foot care and importance of medication, aerobic/resistive exercise and nutrition plan for blood glucose control.;Long Term: Attainment of HbA1C < 7%.    Personal Goal Other Yes    Personal Goal Get stronger; have more  energy. Lose weight.    Intervention Patient will attend CR 3 days/week and supplement with exercise 2 days/week.    Expected Outcomes Patient will meet both personal and program goals.           Core Components/Risk Factors/Patient Goals Review:   Goals and Risk Factor Review    Row Name 12/28/20 0738 01/24/21 0855           Core Components/Risk Factors/Patient Goals Review   Personal Goals Review Diabetes;Weight Management/Obesity Diabetes;Weight Management/Obesity      Review Patient is new to progam completing 2 sessions. He has multiple risk factors for CAD and is participating in CR for risk modification. His last A1C on file was 6.7%. His personal goals for the program are to lose 10 lbs;  get stronger; and have more energy. Will continue to monitor for progress. Patient has completed 10 sessions losing 8 lbs since he started the program. He is doing well with progressions and consistent attendance. He last A1C was 10/16/20 at 6.7%. His DM is managed with Metformin. His personal goals are to lose 10 bls; get stronger; and to have more energy. We will continue to monitor him as he works toward meeting these goals.      Expected Outcomes Patient will complete the program meeting his personal and program goals. Patient will complete the program meeting his personal and program goals.             Core Components/Risk Factors/Patient Goals at Discharge (Final Review):   Goals and Risk Factor Review - 01/24/21 0855      Core Components/Risk Factors/Patient Goals Review   Personal Goals Review Diabetes;Weight Management/Obesity    Review Patient has completed 10 sessions losing 8 lbs since he started the program. He is doing well with progressions and consistent attendance. He last A1C was 10/16/20 at 6.7%. His DM is managed with Metformin. His personal goals are to lose 10 bls; get stronger; and to have more energy. We will continue to monitor him as he works toward meeting these goals.     Expected Outcomes Patient will complete the program meeting his personal and program goals.           ITP Comments:   Comments: ITP REVIEW Pt is making expected progress toward Cardiac Rehab goals after completing 12 sessions. Recommend continued exercise, life style modification, education, and increased stamina and strength.

## 2021-01-31 NOTE — Progress Notes (Signed)
Daily Session Note  Patient Details  Name: Herbert Torres MRN: [id number] Date of Birth: 1946/03/20 Referring Provider:   Flowsheet Row CARDIAC REHAB PHASE II ORIENTATION from 12/11/2020 in [place]  Referring Provider Dr. Oval Linsey      Encounter Date: 01/31/2021  Check In:  Session Check In - 01/31/21 0930      Check-In   Supervising physician immediately available to respond to emergencies [facility] MD immediately available    Physician(s) Dr. Harl Bowie    Location [facility]    Staff Present Cathren Harsh, MS, Exercise Physiologist;Dalton Kris Mouton, MS, ACSM-CEP, Exercise Physiologist    Virtual Visit No    Medication changes reported     No    Fall or balance concerns reported    No    Tobacco Cessation No Change    Warm-up and Cool-down Performed as group-led instruction    Resistance Training Performed Yes    VAD Patient? No    PAD/SET Patient? No      Pain Assessment   Currently in Pain? No/denies    Pain Score 0-No pain    Multiple Pain Sites No           Capillary Blood Glucose: No results found for this or any previous visit (from the past 24 hour(s)).    Social History   Tobacco Use  Smoking Status Former Smoker  Smokeless Tobacco Never Used  Tobacco Comment   quit over 30 years ago    Goals Met:  Independence with exercise equipment Exercise tolerated well No report of cardiac concerns or symptoms Strength training completed today  Goals Unmet:  Not Applicable  Comments: check out 1030   Dr. Kathie Dike is Medical Director for [facility] Re[facility].

## 2021-02-02 ENCOUNTER — Encounter (HOSPITAL_COMMUNITY)
Admission: RE | Admit: 2021-02-02 | Discharge: 2021-02-02 | Disposition: A | Discharge status: Home / Self Care | Payer: Medicare Other | Source: Ambulatory Visit | Attending: Cardiovascular Disease | Admitting: Cardiovascular Disease

## 2021-02-02 ENCOUNTER — Other Ambulatory Visit: Payer: Self-pay

## 2021-02-02 DIAGNOSIS — Z951 Presence of aortocoronary bypass graft: Secondary | ICD-10-CM

## 2021-02-02 DIAGNOSIS — I214 Non-ST elevation (NSTEMI) myocardial infarction: Secondary | ICD-10-CM | POA: Diagnosis not present

## 2021-02-02 NOTE — Progress Notes (Signed)
Daily Session Note  Patient Details  Name: Herbert Torres MRN: [id number] Date of Birth: [birth year] Referring Provider:   Flowsheet Row CARDIAC REHAB PHASE II ORIENTATION from 12/11/2020 in [place]  Referring Provider Dr. Oval Linsey      Encounter Date: 02/02/2021  Check In:  Session Check In - 02/02/21 0953      Check-In   Supervising physician immediately available to respond to emergencies [facility] MD immediately available    Physician(s) Dr. Melene Muller    Location [facility]    Staff Present Cathren Harsh, MS, Exercise Physiologist;Dalton Kris Mouton, MS, ACSM-CEP, Exercise Physiologist    Virtual Visit No    Medication changes reported     No    Fall or balance concerns reported    No    Tobacco Cessation No Change    Warm-up and Cool-down Performed as group-led instruction    Resistance Training Performed Yes    VAD Patient? No    PAD/SET Patient? No      Pain Assessment   Currently in Pain? No/denies    Pain Score 0-No pain    Multiple Pain Sites No           Capillary Blood Glucose: No results found for this or any previous visit (from the past 24 hour(s)).    Social History   Tobacco Use  Smoking Status Former Smoker  Smokeless Tobacco Never Used  Tobacco Comment   quit over 30 years ago    Goals Met:  Independence with exercise equipment Exercise tolerated well No report of cardiac concerns or symptoms Strength training completed today  Goals Unmet:  Not Applicable  Comments: check out 1030   Dr. Kathie Dike is Medical Director for [facility] Re[facility].

## 2021-02-05 ENCOUNTER — Encounter (HOSPITAL_COMMUNITY)
Admission: RE | Admit: 2021-02-05 | Discharge: 2021-02-05 | Disposition: A | Discharge status: Home / Self Care | Payer: Medicare Other | Source: Ambulatory Visit | Attending: Cardiovascular Disease | Admitting: Cardiovascular Disease

## 2021-02-05 ENCOUNTER — Other Ambulatory Visit: Payer: Self-pay

## 2021-02-05 DIAGNOSIS — I214 Non-ST elevation (NSTEMI) myocardial infarction: Secondary | ICD-10-CM

## 2021-02-05 DIAGNOSIS — Z951 Presence of aortocoronary bypass graft: Secondary | ICD-10-CM

## 2021-02-05 NOTE — Progress Notes (Signed)
Daily Session Note  Patient Details  Name: Herbert Torres MRN: [id number] Date of Birth: Feb 11, 1946 Referring Provider:   Flowsheet Row CARDIAC REHAB PHASE II ORIENTATION from 12/11/2020 in [place]  Referring Provider Dr. Oval Linsey      Encounter Date: 02/05/2021  Check In:  Session Check In - 02/05/21 0930      Check-In   Supervising physician immediately available to respond to emergencies [facility] MD immediately available    Physician(s) Dr. Melene Muller    Location [facility]    Staff Present Cathren Harsh, MS, Exercise Physiologist;Dalton Kris Mouton, MS, ACSM-CEP, Exercise Physiologist    Virtual Visit No    Medication changes reported     No    Fall or balance concerns reported    No    Tobacco Cessation No Change    Warm-up and Cool-down Performed as group-led instruction    Resistance Training Performed Yes    VAD Patient? No    PAD/SET Patient? No      Pain Assessment   Currently in Pain? No/denies    Pain Score 0-No pain    Multiple Pain Sites No           Capillary Blood Glucose: No results found for this or any previous visit (from the past 24 hour(s)).    Social History   Tobacco Use  Smoking Status Former Smoker  Smokeless Tobacco Never Used  Tobacco Comment   quit over 30 years ago    Goals Met:  Independence with exercise equipment Exercise tolerated well No report of cardiac concerns or symptoms Strength training completed today  Goals Unmet:  Not Applicable  Comments: check out 1030   Dr. Kathie Dike is Medical Director for [facility] Pulmonary Rehab.

## 2021-02-07 ENCOUNTER — Encounter (HOSPITAL_COMMUNITY)
Admission: RE | Admit: 2021-02-07 | Discharge: 2021-02-07 | Disposition: A | Discharge status: Home / Self Care | Payer: Medicare Other | Source: Ambulatory Visit | Attending: Cardiovascular Disease | Admitting: Cardiovascular Disease

## 2021-02-07 ENCOUNTER — Other Ambulatory Visit: Payer: Self-pay

## 2021-02-07 DIAGNOSIS — I214 Non-ST elevation (NSTEMI) myocardial infarction: Secondary | ICD-10-CM

## 2021-02-07 DIAGNOSIS — Z951 Presence of aortocoronary bypass graft: Secondary | ICD-10-CM

## 2021-02-07 NOTE — Progress Notes (Signed)
Daily Session Note  Patient Details  Name: Herbert Torres MRN: [id number] Date of Birth: Jul 10, 1946 Referring Provider:   Flowsheet Row CARDIAC REHAB PHASE II ORIENTATION from 12/11/2020 in [place]  Referring Provider Dr. Oval Linsey      Encounter Date: 02/07/2021  Check In:  Session Check In - 02/07/21 0930      Check-In   Supervising physician immediately available to respond to emergencies [facility] MD immediately available    Physician(s) Dr. Melene Muller    Location [facility]    Staff Present Cathren Harsh, MS, Exercise Physiologist;Dalton Kris Mouton, MS, ACSM-CEP, Exercise Physiologist    Virtual Visit No    Medication changes reported     No    Fall or balance concerns reported    No    Tobacco Cessation No Change    Warm-up and Cool-down Performed as group-led instruction    Resistance Training Performed Yes    VAD Patient? No    PAD/SET Patient? No      Pain Assessment   Currently in Pain? No/denies    Pain Score 0-No pain    Multiple Pain Sites No           Capillary Blood Glucose: No results found for this or any previous visit (from the past 24 hour(s)).    Social History   Tobacco Use  Smoking Status Former Smoker  Smokeless Tobacco Never Used  Tobacco Comment   quit over 30 years ago    Goals Met:  Independence with exercise equipment Exercise tolerated well No report of cardiac concerns or symptoms Strength training completed today  Goals Unmet:  Not Applicable  Comments: check out 1030   Dr. Kathie Dike is Medical Director for [facility] Pulmonary Rehab.

## 2021-02-09 ENCOUNTER — Encounter (HOSPITAL_COMMUNITY)
Admission: RE | Admit: 2021-02-09 | Discharge: 2021-02-09 | Disposition: A | Discharge status: Home / Self Care | Payer: Medicare Other | Source: Ambulatory Visit | Attending: Cardiovascular Disease | Admitting: Cardiovascular Disease

## 2021-02-09 ENCOUNTER — Other Ambulatory Visit: Payer: Self-pay

## 2021-02-09 DIAGNOSIS — I214 Non-ST elevation (NSTEMI) myocardial infarction: Secondary | ICD-10-CM

## 2021-02-09 DIAGNOSIS — Z951 Presence of aortocoronary bypass graft: Secondary | ICD-10-CM

## 2021-02-09 NOTE — Progress Notes (Signed)
Daily Session Note  Patient Details  Name: Herbert Torres MRN: [id number] Date of Birth: 09-Apr-1946 Referring Provider:   Flowsheet Row CARDIAC REHAB PHASE II ORIENTATION from 12/11/2020 in [place]  Referring Provider Dr. Oval Linsey      Encounter Date: 02/09/2021  Check In:  Session Check In - 02/09/21 0930      Check-In   Supervising physician immediately available to respond to emergencies [facility] MD immediately available    Physician(s) Dr. Melene Muller    Location [facility]    Staff Present Aundra Dubin, RN, BSN;Esma Kilts Audria Nine, MS, Exercise Physiologist    Virtual Visit No    Medication changes reported     No    Fall or balance concerns reported    No    Tobacco Cessation No Change    Warm-up and Cool-down Performed as group-led instruction    Resistance Training Performed Yes    VAD Patient? No    PAD/SET Patient? No      Pain Assessment   Currently in Pain? No/denies    Pain Score 0-No pain    Multiple Pain Sites No           Capillary Blood Glucose: No results found for this or any previous visit (from the past 24 hour(s)).    Social History   Tobacco Use  Smoking Status Former Smoker  Smokeless Tobacco Never Used  Tobacco Comment   quit over 30 years ago    Goals Met:  Independence with exercise equipment Exercise tolerated well No report of cardiac concerns or symptoms Strength training completed today  Goals Unmet:  Not Applicable  Comments: check out 1030   Dr. Kathie Dike is Medical Director for [facility] Pulmonary Rehab.

## 2021-02-12 ENCOUNTER — Other Ambulatory Visit: Payer: Self-pay

## 2021-02-12 ENCOUNTER — Encounter (HOSPITAL_COMMUNITY)
Admission: RE | Admit: 2021-02-12 | Discharge: 2021-02-12 | Disposition: A | Discharge status: Home / Self Care | Payer: Medicare Other | Source: Ambulatory Visit | Attending: Cardiovascular Disease | Admitting: Cardiovascular Disease

## 2021-02-12 VITALS — Wt 214.5 lb

## 2021-02-12 DIAGNOSIS — I214 Non-ST elevation (NSTEMI) myocardial infarction: Secondary | ICD-10-CM | POA: Diagnosis not present

## 2021-02-12 DIAGNOSIS — Z951 Presence of aortocoronary bypass graft: Secondary | ICD-10-CM

## 2021-02-12 NOTE — Progress Notes (Signed)
Daily Session Note  Patient Details  Name: Herbert Torres MRN: [id number] Date of Birth: [birth year] Referring Provider:   Flowsheet Row CARDIAC REHAB PHASE II ORIENTATION from 12/11/2020 in [place]  Referring Provider Dr. Oval Linsey      Encounter Date: 02/12/2021  Check In:  Session Check In - 02/12/21 0952      Check-In   Supervising physician immediately available to respond to emergencies [facility] MD immediately available    Physician(s) Dr. Melene Muller    Location [facility]    Staff Present Cathren Harsh, MS, Exercise Physiologist;Dalton Kris Mouton, MS, ACSM-CEP, Exercise Physiologist    Virtual Visit No    Medication changes reported     No    Fall or balance concerns reported    No    Tobacco Cessation No Change    Warm-up and Cool-down Performed as group-led instruction    Resistance Training Performed Yes    VAD Patient? No    PAD/SET Patient? No      Pain Assessment   Currently in Pain? No/denies    Pain Score 0-No pain    Multiple Pain Sites No           Capillary Blood Glucose: No results found for this or any previous visit (from the past 24 hour(s)).    Social History   Tobacco Use  Smoking Status Former Smoker  Smokeless Tobacco Never Used  Tobacco Comment   quit over 30 years ago    Goals Met:  Independence with exercise equipment Exercise tolerated well No report of cardiac concerns or symptoms Strength training completed today  Goals Unmet:  Not Applicable  Comments: check out 1030   Dr. Kathie Dike is Medical Director for [facility] Pulmonary Rehab.

## 2021-02-13 NOTE — Progress Notes (Signed)
Cardiology Clinic Note   Patient Name: Herbert Torres Date of Encounter: 02/15/2021  Primary Care Provider:  [facility] Primary Cardiologist:  Chilton Si, MD  Patient Profile    Herbert Torres 75 year old male presents to the clinic today for follow-up evaluation status post CABG x4(LIMA to LAD, RSVG to Diagonal, RSVG to OM 1, and RSVG to OM 3) , and left atrial appendage clipping.  Past Medical History    Past Medical History:  Diagnosis Date  . Coronary artery disease   . Diabetes mellitus without complication (HCC)   . GERD (gastroesophageal reflux disease)   . HOH (hard of hearing)    AIDS  . Hyperlipidemia   . Hypertension    Past Surgical History:  Procedure Laterality Date  . APPENDECTOMY    . CARDIOVERSION N/A 10/27/2020   Procedure: CARDIOVERSION;  Surgeon: Lewayne Bunting, MD;  Location: [facility] ENDOSCOPY;  Service: Cardiovascular;  Laterality: N/A;  . CATARACT EXTRACTION W/PHACO Right 02/26/2018   Procedure: CATARACT EXTRACTION PHACO AND INTRAOCULAR LENS PLACEMENT (IOC);  Surgeon: Nevada Crane, MD;  Location: [facility] ORS;  Service: Ophthalmology;  Laterality: Right;  fluid pak loT# [id number] H  exp10/31/2020 [place]   00:38.9 AP%   16.3 CDE   6.33  . CLIPPING OF ATRIAL APPENDAGE Left 10/18/2020   Procedure: CLIPPING OF LEFT ATRIAL APPENDAGE USING ATRICURE ATRICLIP SIZE 40;  Surgeon: Corliss Skains, MD;  Location: [facility] OR;  Service: Open Heart Surgery;  Laterality: Left;  . COLONOSCOPY    . CORONARY ARTERY BYPASS GRAFT N/A 10/18/2020   Procedure: CORONARY ARTERY BYPASS GRAFTING (CABG) TIMES FOUR USING LEFT INTERNAL MAMMARY ARTERY AND RIGHT LEG GREATER SAPHENOUS VEIN HARVESTED ENDOSCOPICALLY Flowtrack only;  Surgeon: Corliss Skains, MD;  Location: [facility] OR;  Service: Open Heart Surgery;  Laterality: N/A;  . IR THORACENTESIS ASP PLEURAL SPACE W/IMG GUIDE  10/23/2020  . LEFT HEART CATH AND CORONARY ANGIOGRAPHY N/A 10/16/2020   Procedure: LEFT  HEART CATH AND CORONARY ANGIOGRAPHY;  Surgeon: Marykay Lex, MD;  Location: [facility] INVASIVE CV LAB;  Service: Cardiovascular;  Laterality: N/A;  . TEE WITHOUT CARDIOVERSION N/A 10/18/2020   Procedure: TRANSESOPHAGEAL ECHOCARDIOGRAM (TEE);  Surgeon: Corliss Skains, MD;  Location: [facility] OR;  Service: Open Heart Surgery;  Laterality: N/A;    Allergies  Allergies  Allergen Reactions  . Atorvastatin Other (See Comments)    C/o myalgias in knees    History of Present Illness    Mr. Herbert Torres has a past medical history of atrial fibrillation on Eliquis, DM, HTN, HLD, and previous tobacco use.  He presented to [facility] with complaints of chest pain.  He indicated that the pain had been present off and on for 2-3 weeks.  He described the pain as heaviness with radiation across his chest.  His pain was exacerbated with increased physical activity and bending over.  He denied associated shortness of breath, nausea, and diaphoresis.  His EKG in the ED showed no ischemic changes however, did show some mild ST depression in lateral leads.  His troponins were elevated at 266.  He was ruled in for NSTEMI and placed on IV heparin and transferred to [facility] for further evaluation.  When he arrived at [facility] he was chest pain-free.  He was admitted to cardiology service.  A repeat troponin showed elevation/peak at 458.  He had previously undergone stress testing in July which showed preserved EF and no evidence of ischemia.  It was felt that he  should undergo cardiac catheterization and it was performed 10/16/2020.  His LHC showed multivessel CAD.  CVTS was consulted.  He underwent CABG x4 on 10/18/2020 by Dr. Cliffton Torres (LIMA to LAD, RSVG to Diagonal, RSVG to OM 1, and RSVG to OM 3).  He also had left atrial appendage clipping.  He tolerated the procedure well and was transitioned to to heart in stable condition.  He developed atrial fibrillation was started on amiodarone gtt. his heart rate improved  and he was converted to oral amiodarone.  He was restarted on his home Eliquis.  He also received diuresis for fluid volume overload.  His CXR showed a moderate left-sided pleural effusion.  He underwent thoracentesis and 750 mL of fluid was removed.  Around that time he developed atrial fibrillation with RVR.  He was treated with IV Lopressor.  He underwent DCCV 10/28/2020.  It was initially successful but he quickly converted back to atrial fibrillation.  He was weaned off O2.  He was discharged home in stable condition and rate controlled atrial fibrillation.  He presented to the clinic 11/15/20 for follow-up evaluation stated he felt well.  He had been increasing his physical activity slowly.  He stated that he was now back at home living on his own and doing all of his normal daily activities.  He had neighbors checking on him at his house as well as his son.  He had been following a low-sodium diet and felt generally well.  He stated that at times he can feel his heart rate beat faster.  This faster heartbeat was only recently been with increased physical activity.  He stated he heated his house with wood and was placing the firewood in his stove 1 piece at a time.  He was following his sternal precautions "staying inside the box".  I  gave him a salty 6 diet sheet, had him continue to increase physical activity as tolerated while adhering to sternal precautions, and planned follow-up with Dr. Duke Torres in 3 months.  Sternal incision clean dry intact no drainage.  Chest tube sites clean dry intact no drainage.  He presents to the clinic today for follow-up evaluation states he feels well.  He does note some right chest soreness.  He reports he has been increasing his physical activity and doing cardiac rehab 3 days/week.  He denies exertional chest discomfort and feels that he is overall doing well.  He has noted some increased dry mouth and increased urination since leaving the hospital.  We reviewed his  medications and I have asked him to speak with his PCP about his [place].  In the meantime I have asked him to decrease his fluid consumption in the evening to see if that will help.  I will have him repeat his lipid panel, maintain his physical activity, and follow-up in 6 months.  Today he denies chest pain, shortness of breath, lower extremity edema, fatigue, palpitations, melena, hematuria, hemoptysis, diaphoresis, weakness, presyncope, syncope, orthopnea, and PND.  Home Medications    Prior to Admission medications   Medication Sig Start Date End Date Taking? Authorizing Provider  amiodarone (PACERONE) 200 MG tablet Take 2 tablets (400 mg total) by mouth 2 (two) times daily. For 5 days, then decrease to 200 mg BID x 7 days, then decrease to 200 mg daily Patient taking differently: Take 200 mg by mouth 2 (two) times daily. For 5 days, then decrease to 200 mg BID x 7 days, then decrease to 200 mg daily 10/28/20  Barrett, Erin R, PA-C  apixaban (ELIQUIS) 5 MG TABS tablet Take 5 mg by mouth in the morning and at bedtime. 06/17/19   [provider]  aspirin EC 81 MG EC tablet Take 1 tablet (81 mg total) by mouth daily. Swallow whole. 10/28/20   Barrett, Erin R, PA-C  carvedilol (COREG) 6.25 MG tablet Take 1 tablet (6.25 mg total) by mouth 2 (two) times daily with a meal. 10/28/20   Barrett, Erin R, PA-C  dapagliflozin propanediol (FARXIGA) 10 MG TABS tablet Take 1 tablet (10 mg total) by mouth daily before breakfast. 10/29/20   Barrett, Erin R, PA-C  digoxin (LANOXIN) 0.25 MG tablet Take 1 tablet (0.25 mg total) by mouth daily. 10/28/20   Barrett, Erin R, PA-C  ezetimibe (ZETIA) 10 MG tablet Take 1 tablet (10 mg total) by mouth daily. 01/26/21   Chilton Siandolph, Tiffany, MD  metFORMIN (GLUCOPHAGE) 1000 MG tablet Take 1,000 mg by mouth 2 (two) times daily. 02/08/16   [provider]  Omega-3 Fatty Acids ([employer] NATURAL FISH OIL) 1000 MG CAPS Take 1 capsule by mouth 2 (two) times daily. 02/15/16    [provider]  omeprazole (PRILOSEC) 20 MG capsule Take 20 mg by mouth daily. 02/08/16   [provider]  rosuvastatin (CRESTOR) 20 MG tablet Take 1 tablet (20 mg total) by mouth daily. 10/28/20   Barrett, Rae RoamErin R, PA-C    Family History    Family History  Problem Relation Age of Onset  . Heart attack Mother   . Heart attack Father   . Heart attack Sister   . Heart attack Brother    He indicated that the status of his mother is unknown. He indicated that the status of his father is unknown. He indicated that the status of his sister is unknown. He indicated that the status of his brother is unknown.  Social History    Social History   Socioeconomic History  . Marital status: Divorced    Spouse name: Not on file  . Number of children: Not on file  . Years of education: Not on file  . Highest education level: Not on file  Occupational History  . Not on file  Tobacco Use  . Smoking status: Former Games developermoker  . Smokeless tobacco: Never Used  . Tobacco comment: quit over 30 years ago  Vaping Use  . Vaping Use: Never used  Substance and Sexual Activity  . Alcohol use: No  . Drug use: Not on file  . Sexual activity: Not on file  Other Topics Concern  . Not on file  Social History Narrative  . Not on file   Social Determinants of Health   Financial Resource Strain: Not on file  Food Insecurity: Not on file  Transportation Needs: Not on file  Physical Activity: Not on file  Stress: Not on file  Social Connections: Not on file  Intimate Partner Violence: Not on file     Review of Systems    General:  No chills, fever, night sweats or weight changes.  Cardiovascular:  No chest pain, dyspnea on exertion, edema, orthopnea, palpitations, paroxysmal nocturnal dyspnea. Dermatological: No rash, lesions/masses Respiratory: No cough, dyspnea Urologic: No hematuria, dysuria Abdominal:   No nausea, vomiting, diarrhea, bright red blood per rectum, melena, or  hematemesis Neurologic:  No visual changes, wkns, changes in mental status. All other systems reviewed and are otherwise negative except as noted above.  Physical Exam    VS:  BP 136/72 (BP Location: Left  Arm, Patient Position: Sitting, Cuff Size: Normal)   Pulse 70   Wt 219 lb 9.6 oz (99.6 kg)   BMI 28.97 kg/m  , BMI Body mass index is 28.97 kg/m. GEN: Well nourished, well developed, in no acute distress. HEENT: normal. Neck: Supple, no JVD, carotid bruits, or masses. Cardiac: RRR, no murmurs, rubs, or gallops. No clubbing, cyanosis, edema.  Radials/DP/PT 2+ and equal bilaterally.  Respiratory:  Respirations regular and unlabored, clear to auscultation bilaterally. GI: Soft, nontender, nondistended, BS + x 4. MS: no deformity or atrophy. Skin: warm and dry, no rash. Neuro:  Strength and sensation are intact. Psych: Normal affect.  Accessory Clinical Findings    Recent Labs: 10/15/2020: ALT 23; B Natriuretic Peptide 154.0 10/19/2020: Magnesium 1.9 10/23/2020: Hemoglobin 11.7; Platelets 441 10/24/2020: TSH 0.975 10/27/2020: BUN 29; Creatinine, Ser 1.28; Potassium 4.0; Sodium 132   Recent Lipid Panel    Component Value Date/Time   CHOL 212 (H) 10/16/2020 0148   TRIG 228 (H) 10/16/2020 0148   HDL 37 (L) 10/16/2020 0148   CHOLHDL 5.7 10/16/2020 0148   VLDL 46 (H) 10/16/2020 0148   LDLCALC 129 (H) 10/16/2020 0148    ECG personally reviewed by me today-none today. EKG 11/15/2020 normal sinus rhythm nonspecific T wave abnormality 65 bpm- No acute changes  Echocardiogram 10/16/2020 IMPRESSIONS    1. Left ventricular ejection fraction, by estimation, is 50 to 55%. The  left ventricle has low normal function. The left ventricle demonstrates  regional wall motion abnormalities (see scoring diagram/findings for  description). Left ventricular diastolic  parameters are consistent with Grade I diastolic dysfunction (impaired  relaxation).  2. Right ventricular systolic  function is normal. The right ventricular  size is mildly enlarged.  3. Left atrial size was mild to moderately dilated.  4. The mitral valve is grossly normal. No evidence of mitral valve  regurgitation.  5. The aortic valve was not well visualized. Aortic valve regurgitation  is not visualized.  6. The inferior vena cava is normal in size with <50% respiratory  variability, suggesting right atrial pressure of 8 mmHg.   Comparison(s): No prior Echocardiogram.  Cardiac catheterization 10/16/2020  Mid RCA to Dist RCA lesion is 100% stenosed.  RV Branch lesion is 90% stenosed.  ---------------------------  Prox LAD to Mid LAD lesion is 80% stenosed with 70% stenosed side branch in 1st Diag & 1st Sept lesion is 70% stenosed.  Mid LAD lesion is 75% stenosed.  ---------------------------  Prox Cx lesion is 75% stenosed.  Lat 3rd Mrg lesion is 80% stenosed.  Diminutive 4th Mrg lesion is 90% stenosed.  Dist Cx / OM lesion is 90% stenosed.  ===================  There is no aortic valve stenosis.  LV end diastolic pressure is normal.  The left ventricular systolic function is normal.  There is trivial (1+) mitral regurgitation.  SUMMARY  Severe multivessel CAD:  ? Possible subacute occlusion or CTO of RCA after RV marginal branch that has an 90% stenosis, with minimal left to right collaterals;  ? Focal eccentric calcified 80% stenosis of the LAD at the takeoff of SP1 (1st Sept) and D1 (1st Diag) along with eccentric 70% mid LAD, and  ? 75% proximal major LCx that has diffuse areas of relatively severe stenosis in the sidebranches.  Relatively normal EF with mild inferior hypokinesis. Relatively normal LVEDP.   RECOMMENDATIONS  With diabetes and existing A. fib on Eliquis already, best option for this gentleman is CVTS consult for CABG.  Continue aggressive risk factor modification.  Results reviewed with Dr. [place]. CVTS consult  called  Diagnostic Dominance: Right  Intervention   Assessment & Plan   1.  Chest wall pain-has some left pectoral pain today.  Seems to be a muscle strain related to arm exercises as he is increasing his physical activity/rehab.  Reassured that it was not his heart and muscular in nature. Rest area  Status post CABG x4-no chest pain today.  Underwent cardiac catheterization which showed multivessel disease.  Underwent CABG times 4 10/18/20 LIMA to LAD, SVG to OM 3, SVG to OM1, and SVG to first diagonal along with left atrial appendage clipping.  Echo showed an EF of 50-55% Continue carvedilol, rosuvastatin, aspirin, Heart healthy low-sodium diet-salty 6 given  Persistent atrial fibrillation-heart rate today 70.  CHA2DS2-VASc score 5.  Blood pressure at home 120s-130s over 60s, monitoring daily. Continue amiodarone, apixaban, carvedilol, digoxin Avoid triggers caffeine, chocolate, EtOH etc. Heart healthy low-sodium diet Continue to follow sternal precautions and slowly increase physical activity  Hyperlipidemia-10/16/2020: Cholesterol 212; HDL 37; LDL Cholesterol 129; Triglycerides 228; VLDL 46 Continue rosuvastatin Heart healthy low-sodium high-fiber diet Repeat fasting lipid panel   Type 2 diabetes-glucose 157 on 10/27/2020. Continue Metformin Heart healthy low-sodium carb modified diet Continue to follow sternal precautions increase physical activity slowly   Disposition: Follow-up with Dr. Duke Torres in 6 months    Thomasene Ripple. Francesa Eugenio NP-C    02/15/2021, 8:58 AM [facility] [address] Office [phone] Fax [phone]  Notice: This dictation was prepared with Dragon dictation along with smaller phrase technology. Any transcriptional errors that result from this process are unintentional and may not be corrected upon review.  I spent 10 minutes examining this patient, reviewing medications, and using patient centered shared  decision making involving her cardiac care.  Prior to her visit I spent greater than 20 minutes reviewing her past medical history,  medications, and prior cardiac tests.

## 2021-02-14 ENCOUNTER — Other Ambulatory Visit: Payer: Self-pay

## 2021-02-14 ENCOUNTER — Encounter (HOSPITAL_COMMUNITY)
Admission: RE | Admit: 2021-02-14 | Discharge: 2021-02-14 | Disposition: A | Discharge status: Home / Self Care | Payer: Medicare Other | Source: Ambulatory Visit | Attending: Cardiovascular Disease | Admitting: Cardiovascular Disease

## 2021-02-14 DIAGNOSIS — I214 Non-ST elevation (NSTEMI) myocardial infarction: Secondary | ICD-10-CM

## 2021-02-14 DIAGNOSIS — Z951 Presence of aortocoronary bypass graft: Secondary | ICD-10-CM

## 2021-02-14 NOTE — Progress Notes (Signed)
Daily Session Note  Patient Details  Name: Herbert Torres MRN: [id number] Date of Birth: 1946-07-31 Referring Provider:   Flowsheet Row CARDIAC REHAB PHASE II ORIENTATION from 12/11/2020 in [place]  Referring Provider Dr. Oval Linsey      Encounter Date: 02/14/2021  Check In:  Session Check In - 02/14/21 0930      Check-In   Supervising physician immediately available to respond to emergencies [facility] MD immediately available    Physician(s) Dr. Domenic Polite    Location [facility]    Staff Present Aundra Dubin, RN, BSN;Madison Audria Nine, MS, Exercise Physiologist    Virtual Visit No    Medication changes reported     No    Fall or balance concerns reported    No    Tobacco Cessation No Change    Warm-up and Cool-down Performed as group-led instruction    Resistance Training Performed Yes    VAD Patient? No    PAD/SET Patient? No      Pain Assessment   Currently in Pain? No/denies    Pain Score 0-No pain    Multiple Pain Sites No           Capillary Blood Glucose: No results found for this or any previous visit (from the past 24 hour(s)).    Social History   Tobacco Use  Smoking Status Former Smoker  Smokeless Tobacco Never Used  Tobacco Comment   quit over 30 years ago    Goals Met:  Independence with exercise equipment Exercise tolerated well No report of cardiac concerns or symptoms Strength training completed today  Goals Unmet:  Not Applicable  Comments: Check out 1030.   Dr. Kathie Dike is Medical Director for [facility] Pulmonary Rehab.

## 2021-02-15 ENCOUNTER — Ambulatory Visit: Payer: Medicare Other | Admitting: General Practice

## 2021-02-15 ENCOUNTER — Encounter: Payer: Self-pay | Admitting: General Practice

## 2021-02-15 ENCOUNTER — Other Ambulatory Visit: Payer: Self-pay

## 2021-02-15 VITALS — BP 136/72 | HR 70 | Wt 219.6 lb

## 2021-02-15 DIAGNOSIS — Z79899 Other long term (current) drug therapy: Secondary | ICD-10-CM

## 2021-02-15 DIAGNOSIS — I4819 Other persistent atrial fibrillation: Secondary | ICD-10-CM | POA: Diagnosis not present

## 2021-02-15 DIAGNOSIS — Z951 Presence of aortocoronary bypass graft: Secondary | ICD-10-CM

## 2021-02-15 DIAGNOSIS — E7800 Pure hypercholesterolemia, unspecified: Secondary | ICD-10-CM

## 2021-02-15 DIAGNOSIS — E0800 Diabetes mellitus due to underlying condition with hyperosmolarity without nonketotic hyperglycemic-hyperosmolar coma (NKHHC): Secondary | ICD-10-CM

## 2021-02-15 NOTE — Patient Instructions (Signed)
Medication Instructions:  The current medical regimen is effective;  continue present plan and medications as directed. Please refer to the Current Medication list given to you today.  *If you need a refill on your cardiac medications before your next appointment, please call your pharmacy*  Lab Work: FASTING LIPID SOON If you have labs (blood work) drawn today and your tests are completely normal, you will receive your results only by:  MyChart Message (if you have MyChart) OR A paper copy in the mail.  If you have any lab test that is abnormal or we need to change your treatment, we will call you to review the results. You may go to any [facility] that is convenient for you however, we do have a lab in our office that is able to assist you. You DO NOT need an appointment for our lab. The lab is open 8:00am and closes at 4:00pm. Lunch 12:45 - 1:45pm.  Testing/Procedures: NONE  Special Instructions PLEASE READ AND FOLLOW SALTY 6-ATTACHED-1,800 mg daily  PLEASE MAINTAIN PHYSICAL ACTIVITY AS TOLERATED  Follow-Up: Your next appointment:  6 month(s) In Person with Chilton Si, MD OR IF UNAVAILABLE JESSE CLEAVER, FNP-C   Please call our office 2 months in advance to schedule this appointment   At [facility], you and your health needs are our priority.  As part of our continuing mission to provide you with exceptional heart care, we have created designated Provider Care Teams.  These Care Teams include your primary Cardiologist (physician) and Advanced Practice Providers (APPs -  Physician Assistants and Nurse Practitioners) who all work together to provide you with the care you need, when you need it.

## 2021-02-16 ENCOUNTER — Other Ambulatory Visit: Payer: Self-pay

## 2021-02-16 ENCOUNTER — Encounter (HOSPITAL_COMMUNITY)
Admission: RE | Admit: 2021-02-16 | Discharge: 2021-02-16 | Disposition: A | Discharge status: Home / Self Care | Payer: Medicare Other | Source: Ambulatory Visit | Attending: Cardiovascular Disease | Admitting: Cardiovascular Disease

## 2021-02-16 DIAGNOSIS — I214 Non-ST elevation (NSTEMI) myocardial infarction: Secondary | ICD-10-CM | POA: Diagnosis not present

## 2021-02-16 DIAGNOSIS — Z951 Presence of aortocoronary bypass graft: Secondary | ICD-10-CM

## 2021-02-16 NOTE — Progress Notes (Signed)
Daily Session Note  Patient Details  Name: Herbert Torres MRN: [id number] Date of Birth: 1946/11/27 Referring Provider:   Flowsheet Row CARDIAC REHAB PHASE II ORIENTATION from 12/11/2020 in [place]  Referring Provider Dr. Oval Linsey      Encounter Date: 02/16/2021  Check In:  Session Check In - 02/16/21 0930      Check-In   Supervising physician immediately available to respond to emergencies [facility] MD immediately available    Physician(s) Dr. Domenic Polite    Location [facility]    Staff Present Cathren Harsh, MS, Exercise Physiologist;Dalton Kris Mouton, MS, ACSM-CEP, Exercise Physiologist    Virtual Visit No    Medication changes reported     No    Fall or balance concerns reported    No    Tobacco Cessation No Change    Warm-up and Cool-down Performed as group-led instruction    Resistance Training Performed Yes    VAD Patient? No    PAD/SET Patient? No      Pain Assessment   Currently in Pain? No/denies    Pain Score 0-No pain    Multiple Pain Sites No           Capillary Blood Glucose: No results found for this or any previous visit (from the past 24 hour(s)).    Social History   Tobacco Use  Smoking Status Former Smoker  Smokeless Tobacco Never Used  Tobacco Comment   quit over 30 years ago    Goals Met:  Independence with exercise equipment Exercise tolerated well No report of cardiac concerns or symptoms Strength training completed today  Goals Unmet:  Not Applicable  Comments: checkout time is 45   Dr. Kathie Dike is Medical Director for [facility] Pulmonary Rehab.

## 2021-02-19 ENCOUNTER — Encounter (HOSPITAL_COMMUNITY): Payer: Medicare Other

## 2021-02-20 ENCOUNTER — Other Ambulatory Visit: Payer: Self-pay | Admitting: Cardiovascular Disease

## 2021-02-20 ENCOUNTER — Other Ambulatory Visit: Payer: Self-pay | Admitting: Physician Assistant

## 2021-02-21 ENCOUNTER — Telehealth: Payer: Self-pay | Admitting: Cardiovascular Disease

## 2021-02-21 ENCOUNTER — Encounter (HOSPITAL_COMMUNITY)
Admission: RE | Admit: 2021-02-21 | Discharge: 2021-02-21 | Disposition: A | Discharge status: Home / Self Care | Payer: Medicare Other | Source: Ambulatory Visit | Attending: Cardiovascular Disease | Admitting: Cardiovascular Disease

## 2021-02-21 ENCOUNTER — Other Ambulatory Visit: Payer: Self-pay

## 2021-02-21 DIAGNOSIS — I214 Non-ST elevation (NSTEMI) myocardial infarction: Secondary | ICD-10-CM | POA: Insufficient documentation

## 2021-02-21 DIAGNOSIS — Z951 Presence of aortocoronary bypass graft: Secondary | ICD-10-CM | POA: Diagnosis present

## 2021-02-21 NOTE — Progress Notes (Signed)
Daily Session Note  Patient Details  Name: Herbert Torres MRN: [id number] Date of Birth: [birth year] Referring Provider:   Flowsheet Row CARDIAC REHAB PHASE II ORIENTATION from 12/11/2020 in [place]  Referring Provider Dr. Oval Linsey      Encounter Date: 02/21/2021  Check In:  Session Check In - 02/21/21 0930      Check-In   Supervising physician immediately available to respond to emergencies [facility] MD immediately available    Physician(s) Dr. Harl Bowie    Location [facility]    Staff Present Cathren Harsh, MS, Exercise Physiologist;Dalton Kris Mouton, MS, ACSM-CEP, Exercise Physiologist    Virtual Visit No    Medication changes reported     No    Fall or balance concerns reported    No    Tobacco Cessation No Change    Warm-up and Cool-down Performed as group-led instruction    Resistance Training Performed Yes    VAD Patient? No    PAD/SET Patient? No      Pain Assessment   Currently in Pain? No/denies    Pain Score 0-No pain    Multiple Pain Sites No           Capillary Blood Glucose: No results found for this or any previous visit (from the past 24 hour(s)).    Social History   Tobacco Use  Smoking Status Former Smoker  Smokeless Tobacco Never Used  Tobacco Comment   quit over 30 years ago    Goals Met:  Independence with exercise equipment Exercise tolerated well No report of cardiac concerns or symptoms Strength training completed today  Goals Unmet:  Not Applicable  Comments: check out 1030   Dr. Kathie Dike is Medical Director for [facility] Pulmonary Rehab.

## 2021-02-21 NOTE — Telephone Encounter (Signed)
*  STAT* If patient is at the pharmacy, call can be transferred to refill team.   1. Which medications need to be refilled? (please list name of each medication and dose if known) rosuvastatin (CRESTOR) 20 MG tablet  2. Which pharmacy/location (including street and city if local pharmacy) is medication to be sent to? [id number] - [place], [place] - [address]  3. Do they need a 30 day or 90 day supply? 90 day   Patient is out of medication

## 2021-02-22 MED ORDER — ROSUVASTATIN CALCIUM 20 MG PO TABS
20.0000 mg | ORAL_TABLET | Freq: Every day | ORAL | 3 refills | Status: DC
Start: 2021-02-22 — End: 2024-04-14

## 2021-02-22 NOTE — Telephone Encounter (Signed)
Refill sent to the pharmacy electronically.  

## 2021-02-23 ENCOUNTER — Encounter (HOSPITAL_COMMUNITY)
Admission: RE | Admit: 2021-02-23 | Discharge: 2021-02-23 | Disposition: A | Discharge status: Home / Self Care | Payer: Medicare Other | Source: Ambulatory Visit | Attending: Cardiovascular Disease | Admitting: Cardiovascular Disease

## 2021-02-23 ENCOUNTER — Other Ambulatory Visit: Payer: Self-pay

## 2021-02-23 DIAGNOSIS — Z951 Presence of aortocoronary bypass graft: Secondary | ICD-10-CM

## 2021-02-23 DIAGNOSIS — I214 Non-ST elevation (NSTEMI) myocardial infarction: Secondary | ICD-10-CM | POA: Diagnosis not present

## 2021-02-23 NOTE — Progress Notes (Signed)
Daily Session Note  Patient Details  Name: Herbert Torres MRN: [id number] Date of Birth: 1946/01/30 Referring Provider:   Flowsheet Row CARDIAC REHAB PHASE II ORIENTATION from 12/11/2020 in [place]  Referring Provider Dr. Oval Linsey      Encounter Date: 02/23/2021  Check In:  Session Check In - 02/23/21 0930      Check-In   Supervising physician immediately available to respond to emergencies [facility] MD immediately available    Physician(s) Dr. Domenic Polite    Location [facility]    Staff Present Cathren Harsh, MS, Exercise Physiologist;Dalton Kris Mouton, MS, ACSM-CEP, Exercise Physiologist    Virtual Visit No    Medication changes reported     No    Fall or balance concerns reported    No    Tobacco Cessation No Change    Warm-up and Cool-down Performed as group-led instruction    Resistance Training Performed Yes    VAD Patient? No    PAD/SET Patient? No      Pain Assessment   Currently in Pain? No/denies    Pain Score 0-No pain    Multiple Pain Sites No           Capillary Blood Glucose: No results found for this or any previous visit (from the past 24 hour(s)).    Social History   Tobacco Use  Smoking Status Former Smoker  Smokeless Tobacco Never Used  Tobacco Comment   quit over 30 years ago    Goals Met:  Independence with exercise equipment Exercise tolerated well No report of cardiac concerns or symptoms Strength training completed today  Goals Unmet:  Not Applicable  Comments: check out 1030   Dr. Kathie Dike is Medical Director for [facility].

## 2021-02-26 ENCOUNTER — Other Ambulatory Visit: Payer: Self-pay

## 2021-02-26 ENCOUNTER — Encounter (HOSPITAL_COMMUNITY)
Admission: RE | Admit: 2021-02-26 | Discharge: 2021-02-26 | Disposition: A | Discharge status: Home / Self Care | Payer: Medicare Other | Source: Ambulatory Visit | Attending: Cardiovascular Disease | Admitting: Cardiovascular Disease

## 2021-02-26 VITALS — Wt 215.6 lb

## 2021-02-26 DIAGNOSIS — Z951 Presence of aortocoronary bypass graft: Secondary | ICD-10-CM

## 2021-02-26 DIAGNOSIS — I214 Non-ST elevation (NSTEMI) myocardial infarction: Secondary | ICD-10-CM

## 2021-02-26 NOTE — Progress Notes (Signed)
Daily Session Note  Patient Details  Name: Herbert Torres MRN: [id number] Date of Birth: 01/17/1946 Referring Provider:   Flowsheet Row CARDIAC REHAB PHASE II ORIENTATION from 12/11/2020 in [place] [facility]  Referring Provider Dr. [place]      Encounter Date: 02/26/2021  Check In:  Session Check In - 02/26/21 0930      Check-In   Supervising physician immediately available to respond to emergencies [facility] MD immediately available    Physician(s) Dr. McDowell    Location [facility]    Staff Present Madison Karch, MS, Exercise Physiologist;Dalton Fletcher, MS, ACSM-CEP, Exercise Physiologist    Virtual Visit No    Medication changes reported     No    Fall or balance concerns reported    No    Tobacco Cessation No Change    Warm-up and Cool-down Performed as group-led instruction    Resistance Training Performed Yes    VAD Patient? No    PAD/SET Patient? No      Pain Assessment   Currently in Pain? No/denies    Pain Score 0-No pain    Multiple Pain Sites No           Capillary Blood Glucose: No results found for this or any previous visit (from the past 24 hour(s)).    Social History   Tobacco Use  Smoking Status Former Smoker  Smokeless Tobacco Never Used  Tobacco Comment   quit over 30 years ago    Goals Met:  Independence with exercise equipment Exercise tolerated well No report of cardiac concerns or symptoms Strength training completed today  Goals Unmet:  Not Applicable  Comments: check out 1030   Dr. Jehanzeb Memon is Medical Director for [facility]. 

## 2021-02-28 ENCOUNTER — Other Ambulatory Visit: Payer: Self-pay

## 2021-02-28 ENCOUNTER — Encounter (HOSPITAL_COMMUNITY)
Admission: RE | Admit: 2021-02-28 | Discharge: 2021-02-28 | Disposition: A | Discharge status: Home / Self Care | Payer: Medicare Other | Source: Ambulatory Visit | Attending: Cardiovascular Disease | Admitting: Cardiovascular Disease

## 2021-02-28 DIAGNOSIS — I214 Non-ST elevation (NSTEMI) myocardial infarction: Secondary | ICD-10-CM | POA: Diagnosis not present

## 2021-02-28 DIAGNOSIS — Z951 Presence of aortocoronary bypass graft: Secondary | ICD-10-CM

## 2021-02-28 NOTE — Progress Notes (Signed)
Daily Session Note  Patient Details  Name: Herbert Torres MRN: [id number] Date of Birth: 1946/12/01 Referring Provider:   Flowsheet Row CARDIAC REHAB PHASE II ORIENTATION from 12/11/2020 in [place]  Referring Provider Dr. Oval Linsey      Encounter Date: 02/28/2021  Check In:  Session Check In - 02/28/21 0930      Check-In   Supervising physician immediately available to respond to emergencies [facility] MD immediately available    Physician(s) Dr. Domenic Polite    Location [facility]    Staff Present Cathren Harsh, MS, Exercise Physiologist;Dalton Kris Mouton, MS, ACSM-CEP, Exercise Physiologist;Debra Wynetta Emery, RN, BSN    Virtual Visit No    Medication changes reported     No    Fall or balance concerns reported    No    Tobacco Cessation No Change    Warm-up and Cool-down Performed as group-led instruction    Resistance Training Performed Yes    VAD Patient? No    PAD/SET Patient? No      Pain Assessment   Currently in Pain? No/denies    Pain Score 0-No pain    Multiple Pain Sites No           Capillary Blood Glucose: No results found for this or any previous visit (from the past 24 hour(s)).    Social History   Tobacco Use  Smoking Status Former Smoker  Smokeless Tobacco Never Used  Tobacco Comment   quit over 30 years ago    Goals Met:  Independence with exercise equipment Exercise tolerated well No report of cardiac concerns or symptoms Strength training completed today  Goals Unmet:  Not Applicable  Comments: check out 1030   Dr. Kathie Dike is Medical Director for [facility].

## 2021-02-28 NOTE — Progress Notes (Signed)
Cardiac Individual Treatment Plan  Patient Details  Name: Herbert Torres MRN: [id number] Date of Birth: [birth year] Referring Provider:   Flowsheet Row CARDIAC REHAB PHASE II ORIENTATION from 12/11/2020 in [facility] CARDIAC REHABILITATION  Referring Provider Dr. Duke Salvia      Initial Encounter Date:  Flowsheet Row CARDIAC REHAB PHASE II ORIENTATION from 12/11/2020 in [place] CARDIAC REHABILITATION  Date 12/11/20      Visit Diagnosis: NSTEMI (non-ST elevated myocardial infarction) (HCC)  S/P CABG x 4  Patient's Home Medications on Admission:  Current Outpatient Medications:  .  amiodarone (PACERONE) 200 MG tablet, Take 2 tablets (400 mg total) by mouth 2 (two) times daily. For 5 days, then decrease to 200 mg BID x 7 days, then decrease to 200 mg daily (Patient taking differently: Take 200 mg by mouth 2 (two) times daily. For 5 days, then decrease to 200 mg BID x 7 days, then decrease to 200 mg daily), Disp: 90 tablet, Rfl: 1 .  apixaban (ELIQUIS) 5 MG TABS tablet, Take 5 mg by mouth in the morning and at bedtime., Disp: , Rfl:  .  aspirin EC 81 MG EC tablet, Take 1 tablet (81 mg total) by mouth daily. Swallow whole., Disp: 30 tablet, Rfl: 11 .  carvedilol (COREG) 6.25 MG tablet, TAKE 1 TABLET BY MOUTH 2 TIMES DAILY WITH A MEAL., Disp: 180 tablet, Rfl: 1 .  dapagliflozin propanediol (FARXIGA) 10 MG TABS tablet, Take 1 tablet (10 mg total) by mouth daily before breakfast., Disp: 30 tablet, Rfl: 3 .  digoxin (LANOXIN) 0.25 MG tablet, TAKE 1 TABLET BY MOUTH EVERY DAY, Disp: 90 tablet, Rfl: 1 .  ezetimibe (ZETIA) 10 MG tablet, Take 1 tablet (10 mg total) by mouth daily., Disp: 30 tablet, Rfl: 3 .  metFORMIN (GLUCOPHAGE) 1000 MG tablet, Take 1,000 mg by mouth 2 (two) times daily., Disp: , Rfl: 4 .  Omega-3 Fatty Acids ([employer] NATURAL FISH OIL) 1000 MG CAPS, Take 1 capsule by mouth 2 (two) times daily., Disp: , Rfl: 5 .  omeprazole (PRILOSEC) 20 MG capsule, Take 20 mg by mouth daily., Disp:  , Rfl: 3 .  rosuvastatin (CRESTOR) 20 MG tablet, Take 1 tablet (20 mg total) by mouth daily., Disp: 90 tablet, Rfl: 3  Past Medical History: Past Medical History:  Diagnosis Date  . Coronary artery disease   . Diabetes mellitus without complication (HCC)   . GERD (gastroesophageal reflux disease)   . HOH (hard of hearing)    AIDS  . Hyperlipidemia   . Hypertension     Tobacco Use: Social History   Tobacco Use  Smoking Status Former Smoker  Smokeless Tobacco Never Used  Tobacco Comment   quit over 30 years ago    Labs: Recent Hydrographic surveyor    Labs for ITP Cardiac and Pulmonary Rehab Latest Ref Rng & Units 10/18/2020 10/18/2020 10/18/2020 10/18/2020 10/19/2020   Cholestrol 0 - 200 mg/dL - - - - -   LDLCALC 0 - 99 mg/dL - - - - -   HDL >65 mg/dL - - - - -   Trlycerides <150 mg/dL - - - - -   Hemoglobin A1c 4.8 - 5.6 % - - - - -   PHART 7.350 - 7.450 - 7.386 7.321(L) 7.335(L) 7.334(L)   PCO2ART 32.0 - 48.0 mmHg - 39.0 42.1 42.6 38.9   HCO3 20.0 - 28.0 mmol/L - 23.5 21.7 22.6 20.5   TCO2 22 - 32 mmol/L 24 25 23 24  22  ACIDBASEDEF 0.0 - 2.0 mmol/L - 2.0 4.0(H) 3.0(H) 5.0(H)   O2SAT % - 94.0 94.0 92.0 96.0      Capillary Blood Glucose: Lab Results  Component Value Date   GLUCAP 130 (H) 10/29/2020   GLUCAP 90 10/28/2020   GLUCAP 213 (H) 10/28/2020   GLUCAP 133 (H) 10/28/2020   GLUCAP 169 (H) 10/28/2020    POCT Glucose    Row Name 01/01/21 1248 01/29/21 1137           POCT Blood Glucose   Pre-Exercise 156 mg/dL --      Pre-Exercise #2 -- 139 mg/dL             Exercise Target Goals: Exercise Program Goal: Individual exercise prescription set using results from initial 6 min walk test and THRR while considering  patient's activity barriers and safety.   Exercise Prescription Goal: Starting with aerobic activity 30 plus minutes a day, 3 days per week for initial exercise prescription. Provide home exercise prescription and guidelines that participant  acknowledges understanding prior to discharge.  Activity Barriers & Risk Stratification:  Activity Barriers & Cardiac Risk Stratification - 12/11/20 1304      Activity Barriers & Cardiac Risk Stratification   Activity Barriers None    Cardiac Risk Stratification High           6 Minute Walk:  6 Minute Walk    Row Name 12/11/20 1421         6 Minute Walk   Phase Initial     Distance 1200 feet     Walk Time 6 minutes     # of Rest Breaks 0     MPH 2.27     METS 2.41     RPE 14     VO2 Peak 8.44     Symptoms No     Resting HR 60 bpm     Resting BP 140/50     Resting Oxygen Saturation  95 %     Exercise Oxygen Saturation  during 6 min walk 97 %     Max Ex. HR 80 bpm     Max Ex. BP 180/72     2 Minute Post BP 156/64            Oxygen Initial Assessment:   Oxygen Re-Evaluation:   Oxygen Discharge (Final Oxygen Re-Evaluation):   Initial Exercise Prescription:  Initial Exercise Prescription - 12/11/20 1400      Date of Initial Exercise RX and Referring Provider   Date 12/11/20    Referring Provider Dr. Duke Salvia    Expected Discharge Date 03/16/21      NuStep   Level 1    SPM 80    Minutes 22      Arm Ergometer   Level 1    RPM 60    Minutes 17      Prescription Details   Frequency (times per week) 2    Duration Progress to 30 minutes of continuous aerobic without signs/symptoms of physical distress      Intensity   THRR 40-80% of Max Heartrate 58-115    Ratings of Perceived Exertion 11-13    Perceived Dyspnea 0-4      Resistance Training   Training Prescription Yes    Weight 3 lbs    Reps 10-15           Perform Capillary Blood Glucose checks as needed.  Exercise Prescription Changes:   Exercise Prescription Changes    Row Name  01/01/21 1200 01/12/21 1000 01/15/21 1300 01/26/21 0930 02/12/21 1000     Response to Exercise   Blood Pressure (Admit) 162/78 -- 140/50 110/52 140/58   Blood Pressure (Exercise) 174/72 -- 130/74 138/64  166/62   Blood Pressure (Exit) 124/68 -- 128/60 130/62 126/58   Heart Rate (Admit) 78 bpm -- 63 bpm 65 bpm 58 bpm   Heart Rate (Exercise) 101 bpm -- 86 bpm 90 bpm 82 bpm   Heart Rate (Exit) 80 bpm -- 70 bpm 74 bpm 67 bpm   Rating of Perceived Exertion (Exercise) 13 -- 16 15 13    Duration Continue with 30 min of aerobic exercise without signs/symptoms of physical distress. -- Continue with 30 min of aerobic exercise without signs/symptoms of physical distress. Continue with 30 min of aerobic exercise without signs/symptoms of physical distress. Continue with 30 min of aerobic exercise without signs/symptoms of physical distress.   Intensity THRR unchanged -- THRR unchanged THRR unchanged THRR unchanged     Progression   Progression Continue to progress workloads to maintain intensity without signs/symptoms of physical distress. -- Continue to progress workloads to maintain intensity without signs/symptoms of physical distress. Continue to progress workloads to maintain intensity without signs/symptoms of physical distress. Continue to progress workloads to maintain intensity without signs/symptoms of physical distress.     Resistance Training   Training Prescription Yes -- Yes Yes Yes   Weight 3 lbs -- 3 lbs 3 lbs 4 lbs   Reps 10-15 -- 10-15 10-15 10-15   Time -- -- 10 Minutes 10 Minutes 10 Minutes     NuStep   Level 1 -- 1 2 3    SPM 98 -- 101 101 99   Minutes 22 -- 17 17 17    METs 2.3 -- 2.4 2.3 2.3     Arm Ergometer   Level 1 -- 1 2 3    RPM 45 -- 46 53 47   Minutes 17 -- 22 22 22    METs 1.5 -- 1.8 2 2      Home Exercise Plan   Plans to continue exercise at -- Home (comment) -- -- --   Frequency -- Add 2 additional days to program exercise sessions. -- -- --   Initial Home Exercises Provided -- 01/12/21 -- -- --   Row Name 02/26/21 1000             Response to Exercise   Blood Pressure (Admit) 148/60       Blood Pressure (Exercise) 172/60       Blood Pressure (Exit) 144/64        Heart Rate (Admit) 53 bpm       Heart Rate (Exercise) 95 bpm       Heart Rate (Exit) 62 bpm       Rating of Perceived Exertion (Exercise) 13       Duration Continue with 30 min of aerobic exercise without signs/symptoms of physical distress.       Intensity THRR unchanged               Progression   Progression Continue to progress workloads to maintain intensity without signs/symptoms of physical distress.               Resistance Training   Training Prescription Yes       Weight 4 lbs       Reps 10-15       Time 10 Minutes  NuStep   Level 3       SPM 95       Minutes 17       METs 2.2               Arm Ergometer   Level 3       RPM 48       Minutes 22       METs 2              Exercise Comments:   Exercise Comments    Row Name 12/27/20 1045 01/12/21 1034         Exercise Comments Patient completed first day of rehab. He tolerated exercise well with no complaints. He said that he enjoyed the session and looks forward to coming back and completing the program. Reviewed home exercise program with patient. He will start walking at home and start doing some light resistance training.             Exercise Goals and Review:   Exercise Goals    Row Name 12/11/20 1424 01/01/21 1248 01/29/21 1135 02/26/21 1042       Exercise Goals   Increase Physical Activity Yes Yes Yes Yes    Intervention Provide advice, education, support and counseling about physical activity/exercise needs.;Develop an individualized exercise prescription for aerobic and resistive training based on initial evaluation findings, risk stratification, comorbidities and participant's personal goals. Provide advice, education, support and counseling about physical activity/exercise needs.;Develop an individualized exercise prescription for aerobic and resistive training based on initial evaluation findings, risk stratification, comorbidities and participant's personal goals. Provide  advice, education, support and counseling about physical activity/exercise needs.;Develop an individualized exercise prescription for aerobic and resistive training based on initial evaluation findings, risk stratification, comorbidities and participant's personal goals. Provide advice, education, support and counseling about physical activity/exercise needs.;Develop an individualized exercise prescription for aerobic and resistive training based on initial evaluation findings, risk stratification, comorbidities and participant's personal goals.    Expected Outcomes Short Term: Attend rehab on a regular basis to increase amount of physical activity.;Long Term: Add in home exercise to make exercise part of routine and to increase amount of physical activity.;Long Term: Exercising regularly at least 3-5 days a week. Short Term: Attend rehab on a regular basis to increase amount of physical activity.;Long Term: Add in home exercise to make exercise part of routine and to increase amount of physical activity.;Long Term: Exercising regularly at least 3-5 days a week. Short Term: Attend rehab on a regular basis to increase amount of physical activity.;Long Term: Add in home exercise to make exercise part of routine and to increase amount of physical activity.;Long Term: Exercising regularly at least 3-5 days a week. Short Term: Attend rehab on a regular basis to increase amount of physical activity.;Long Term: Add in home exercise to make exercise part of routine and to increase amount of physical activity.;Long Term: Exercising regularly at least 3-5 days a week.    Increase Strength and Stamina Yes Yes Yes Yes    Intervention Provide advice, education, support and counseling about physical activity/exercise needs.;Develop an individualized exercise prescription for aerobic and resistive training based on initial evaluation findings, risk stratification, comorbidities and participant's personal goals. Provide advice,  education, support and counseling about physical activity/exercise needs.;Develop an individualized exercise prescription for aerobic and resistive training based on initial evaluation findings, risk stratification, comorbidities and participant's personal goals. Provide advice, education, support and counseling about physical activity/exercise needs.;Develop an individualized exercise prescription  for aerobic and resistive training based on initial evaluation findings, risk stratification, comorbidities and participant's personal goals. Provide advice, education, support and counseling about physical activity/exercise needs.;Develop an individualized exercise prescription for aerobic and resistive training based on initial evaluation findings, risk stratification, comorbidities and participant's personal goals.    Expected Outcomes Short Term: Increase workloads from initial exercise prescription for resistance, speed, and METs.;Short Term: Perform resistance training exercises routinely during rehab and add in resistance training at home;Long Term: Improve cardiorespiratory fitness, muscular endurance and strength as measured by increased METs and functional capacity ( ) Short Term: Increase workloads from initial exercise prescription for resistance, speed, and METs.;Short Term: Perform resistance training exercises routinely during rehab and add in resistance training at home;Long Term: Improve cardiorespiratory fitness, muscular endurance and strength as measured by increased METs and functional capacity ( ) Short Term: Increase workloads from initial exercise prescription for resistance, speed, and METs.;Short Term: Perform resistance training exercises routinely during rehab and add in resistance training at home;Long Term: Improve cardiorespiratory fitness, muscular endurance and strength as measured by increased METs and functional capacity ( ) Short Term: Increase workloads from initial exercise  prescription for resistance, speed, and METs.;Short Term: Perform resistance training exercises routinely during rehab and add in resistance training at home;Long Term: Improve cardiorespiratory fitness, muscular endurance and strength as measured by increased METs and functional capacity ( )    Able to understand and use rate of perceived exertion (RPE) scale Yes Yes Yes Yes    Intervention Provide education and explanation on how to use RPE scale Provide education and explanation on how to use RPE scale Provide education and explanation on how to use RPE scale Provide education and explanation on how to use RPE scale    Expected Outcomes Short Term: Able to use RPE daily in rehab to express subjective intensity level;Long Term:  Able to use RPE to guide intensity level when exercising independently Short Term: Able to use RPE daily in rehab to express subjective intensity level;Long Term:  Able to use RPE to guide intensity level when exercising independently Short Term: Able to use RPE daily in rehab to express subjective intensity level;Long Term:  Able to use RPE to guide intensity level when exercising independently Short Term: Able to use RPE daily in rehab to express subjective intensity level;Long Term:  Able to use RPE to guide intensity level when exercising independently    Knowledge and understanding of Target Heart Rate Range (THRR) Yes Yes Yes Yes    Intervention Provide education and explanation of THRR including how the numbers were predicted and where they are located for reference Provide education and explanation of THRR including how the numbers were predicted and where they are located for reference Provide education and explanation of THRR including how the numbers were predicted and where they are located for reference Provide education and explanation of THRR including how the numbers were predicted and where they are located for reference    Expected Outcomes Short Term: Able to  state/look up THRR;Long Term: Able to use THRR to govern intensity when exercising independently;Short Term: Able to use daily as guideline for intensity in rehab Short Term: Able to state/look up THRR;Long Term: Able to use THRR to govern intensity when exercising independently;Short Term: Able to use daily as guideline for intensity in rehab Short Term: Able to state/look up THRR;Long Term: Able to use THRR to govern intensity when exercising independently;Short Term: Able to use daily as guideline for intensity in rehab Short Term:  Able to state/look up THRR;Long Term: Able to use THRR to govern intensity when exercising independently;Short Term: Able to use daily as guideline for intensity in rehab    Able to check pulse independently Yes Yes Yes Yes    Intervention Provide education and demonstration on how to check pulse in carotid and radial arteries.;Review the importance of being able to check your own pulse for safety during independent exercise Provide education and demonstration on how to check pulse in carotid and radial arteries.;Review the importance of being able to check your own pulse for safety during independent exercise Provide education and demonstration on how to check pulse in carotid and radial arteries.;Review the importance of being able to check your own pulse for safety during independent exercise Provide education and demonstration on how to check pulse in carotid and radial arteries.;Review the importance of being able to check your own pulse for safety during independent exercise    Expected Outcomes Short Term: Able to explain why pulse checking is important during independent exercise;Long Term: Able to check pulse independently and accurately Short Term: Able to explain why pulse checking is important during independent exercise;Long Term: Able to check pulse independently and accurately Short Term: Able to explain why pulse checking is important during independent exercise;Long  Term: Able to check pulse independently and accurately Short Term: Able to explain why pulse checking is important during independent exercise;Long Term: Able to check pulse independently and accurately    Understanding of Exercise Prescription Yes Yes Yes Yes    Intervention Provide education, explanation, and written materials on patient's individual exercise prescription Provide education, explanation, and written materials on patient's individual exercise prescription Provide education, explanation, and written materials on patient's individual exercise prescription Provide education, explanation, and written materials on patient's individual exercise prescription    Expected Outcomes Short Term: Able to explain program exercise prescription;Long Term: Able to explain home exercise prescription to exercise independently Short Term: Able to explain program exercise prescription;Long Term: Able to explain home exercise prescription to exercise independently Short Term: Able to explain program exercise prescription;Long Term: Able to explain home exercise prescription to exercise independently Short Term: Able to explain program exercise prescription;Long Term: Able to explain home exercise prescription to exercise independently           Exercise Goals Re-Evaluation :  Exercise Goals Re-Evaluation    Row Name 01/01/21 1248 01/29/21 1135 02/26/21 1042         Exercise Goal Re-Evaluation   Exercise Goals Review Increase Physical Activity;Increase Strength and Stamina;Able to understand and use rate of perceived exertion (RPE) scale;Knowledge and understanding of Target Heart Rate Range (THRR);Able to check pulse independently;Understanding of Exercise Prescription Increase Physical Activity;Increase Strength and Stamina;Able to understand and use rate of perceived exertion (RPE) scale;Knowledge and understanding of Target Heart Rate Range (THRR);Able to check pulse independently;Understanding of  Exercise Prescription Increase Physical Activity;Increase Strength and Stamina;Able to understand and use rate of perceived exertion (RPE) scale;Knowledge and understanding of Target Heart Rate Range (THRR);Able to check pulse independently;Understanding of Exercise Prescription     Comments Pt has attended 3 exercise sessions. He has a positive attitude and works hard while he is in rehab. He is currently exercising at 2.3 METs on the stepper. His BP has been elevated at both rest and during exercise so far. If this continues to be a trend, we will inform his cardiologist. Will continue to monitor and progress as able. Pt has attended 11 exercise sessions. He continues to  push himself in rehab and has been able to increase his workloads. His resting BP has came down to within normal limits. He curretnly exercises at 2.3 METs on the stepper. Will continue to monitor and progress as able. Patient has completed 22 exercise sessions. He is progressing well and is able to increase his workloads well. He is exercising at home and says that is going well. We discussed increasing his exercise time at home to 25-30 min per day outside of rehab. He feels he is getting stronger. He is currently working at 2.2 METs on the NuStep. Will continue to monitor and progress as able.     Expected Outcomes Through exercise at rehab and by engaging in a home exercise program the patient will reach their goals. Through exercise at rehab and by engaging in a home exercise program the patient will reach their goals. Through exercise at rehab and by engaging in a home exercise program the patient will reach their goals.             Discharge Exercise Prescription (Final Exercise Prescription Changes):  Exercise Prescription Changes - 02/26/21 1000      Response to Exercise   Blood Pressure (Admit) 148/60    Blood Pressure (Exercise) 172/60    Blood Pressure (Exit) 144/64    Heart Rate (Admit) 53 bpm    Heart Rate (Exercise)  95 bpm    Heart Rate (Exit) 62 bpm    Rating of Perceived Exertion (Exercise) 13    Duration Continue with 30 min of aerobic exercise without signs/symptoms of physical distress.    Intensity THRR unchanged      Progression   Progression Continue to progress workloads to maintain intensity without signs/symptoms of physical distress.      Resistance Training   Training Prescription Yes    Weight 4 lbs    Reps 10-15    Time 10 Minutes      NuStep   Level 3    SPM 95    Minutes 17    METs 2.2      Arm Ergometer   Level 3    RPM 48    Minutes 22    METs 2           Nutrition:  Target Goals: Understanding of nutrition guidelines, daily intake of sodium 1500mg , cholesterol 200mg , calories 30% from fat and 7% or less from saturated fats, daily to have 5 or more servings of fruits and vegetables.  Biometrics:  Pre Biometrics - 02/26/21 1042      Pre Biometrics   Weight 97.8 kg            Nutrition Therapy Plan and Nutrition Goals:  Nutrition Therapy & Goals - 02/21/21 0918      Personal Nutrition Goals   Comments We will continue to provide nutritional heart healthy education through handouts.      Intervention Plan   Intervention Nutrition handout(s) given to patient.           Nutrition Assessments:  Nutrition Assessments - 12/11/20 1453      MEDFICTS Scores   Pre Score 63          MEDIFICTS Score Key:  ?70 Need to make dietary changes   40-70 Heart Healthy Diet  ? 40 Therapeutic Level Cholesterol Diet   Picture Your Plate Scores:  <16 Unhealthy dietary pattern with much room for improvement.  41-50 Dietary pattern unlikely to meet recommendations for good health and room for  improvement.  51-60 More healthful dietary pattern, with some room for improvement.   >60 Healthy dietary pattern, although there may be some specific behaviors that could be improved.    Nutrition Goals Re-Evaluation:   Nutrition Goals Discharge (Final  Nutrition Goals Re-Evaluation):   Psychosocial: Target Goals: Acknowledge presence or absence of significant depression and/or stress, maximize coping skills, provide positive support system. Participant is able to verbalize types and ability to use techniques and skills needed for reducing stress and depression.  Initial Review & Psychosocial Screening:  Initial Psych Review & Screening - 12/11/20 1449      Initial Review   Current issues with None Identified      Family Dynamics   Good Support System? Yes    Comments Patient has no psychosocial issues identified. His initial QOL score was 30.28% overall and his PHQ-9 score was 3. He denies any depression, anxiety or stress. He lives alone. He had a daughter and a son that he speaks with everyday and he reports having a special friend he likes to spend time with. He demonstrates a positive outlook about his health and his future. Will continue to monitor.      Barriers   Psychosocial barriers to participate in program There are no identifiable barriers or psychosocial needs.      Screening Interventions   Interventions Encouraged to exercise    Expected Outcomes Long Term goal: The participant improves quality of Life and PHQ9 Scores as seen by post scores and/or verbalization of changes           Quality of Life Scores:  Quality of Life - 12/11/20 1420      Quality of Life   Select Quality of Life      Quality of Life Scores   Health/Function Pre 22.73 %    Socioeconomic Pre 23.21 %    Psych/Spiritual Pre 24.43 %    Family Pre 26.1 %    GLOBAL Pre 23.68 %          Scores of 19 and below usually indicate a poorer quality of life in these areas.  A difference of  2-3 points is a clinically meaningful difference.  A difference of 2-3 points in the total score of the Quality of Life Index has been associated with significant improvement in overall quality of life, self-image, physical symptoms, and general health in studies  assessing change in quality of life.  PHQ-9: Recent Review Flowsheet Data    Depression screen [facility] 2/9 12/11/2020   Decreased Interest 0   Down, Depressed, Hopeless 0   PHQ - 2 Score 0   Altered sleeping 2   Tired, decreased energy 1   Change in appetite 0   Feeling bad or failure about yourself  0   Trouble concentrating 0   Moving slowly or fidgety/restless 0   Suicidal thoughts 0   PHQ-9 Score 3   Difficult doing work/chores Not difficult at all     Interpretation of Total Score  Total Score Depression Severity:  1-4 = Minimal depression, 5-9 = Mild depression, 10-14 = Moderate depression, 15-19 = Moderately severe depression, 20-27 = Severe depression   Psychosocial Evaluation and Intervention:  Psychosocial Evaluation - 12/28/20 0737      Psychosocial Evaluation & Interventions   Interventions Stress management education;Relaxation education;Encouraged to exercise with the program and follow exercise prescription    Comments Patient is new to the program completing 2 sessions. He continues to have no psychosocial issues identified.  He is glad to start the program and looks forward to feeling stronger and meetin his goals. Will continue to montior.    Expected Outcomes Patient will have no psychosocial issues identified at discaharge.    Continue Psychosocial Services  No Follow up required           Psychosocial Re-Evaluation:  Psychosocial Re-Evaluation    Row Name 01/24/21 [phone] 02/21/21 9604           Psychosocial Re-Evaluation   Current issues with None Identified None Identified      Comments Patient has completed 10 session and continues to have no psychosical issues identified. He continues to demonstate a positive attitude and is interactive with staff and others in his class. His bother passed away a few days ago and he have been out. Will continue to monitor. Patient has completed 20 session and continues to have no psychosical issues identified. He  continues to demonstate a positive attitude and is interactive with staff and others in his class.  Will continue to monitor.      Expected Outcomes Patient will have no psychosocial issues identified at discharge. Patient will have no psychosocial issues identified at discharge.      Interventions Stress management education;Encouraged to attend Cardiac Rehabilitation for the exercise;Relaxation education Stress management education;Encouraged to attend Cardiac Rehabilitation for the exercise;Relaxation education      Continue Psychosocial Services  No Follow up required No Follow up required             Psychosocial Discharge (Final Psychosocial Re-Evaluation):  Psychosocial Re-Evaluation - 02/21/21 0918      Psychosocial Re-Evaluation   Current issues with None Identified    Comments Patient has completed 20 session and continues to have no psychosical issues identified. He continues to demonstate a positive attitude and is interactive with staff and others in his class.  Will continue to monitor.    Expected Outcomes Patient will have no psychosocial issues identified at discharge.    Interventions Stress management education;Encouraged to attend Cardiac Rehabilitation for the exercise;Relaxation education    Continue Psychosocial Services  No Follow up required           Vocational Rehabilitation: Provide vocational rehab assistance to qualifying candidates.   Vocational Rehab Evaluation & Intervention:  Vocational Rehab - 12/11/20 1455      Initial Vocational Rehab Evaluation & Intervention   Assessment shows need for Vocational Rehabilitation No      Vocational Rehab Re-Evaulation   Comments Patient is retired and is not interested in returning to work. He does not need vocational rehab.           Education: Education Goals: Education classes will be provided on a weekly basis, covering required topics. Participant will state understanding/return demonstration of topics  presented.  Learning Barriers/Preferences:  Learning Barriers/Preferences - 12/11/20 1454      Learning Barriers/Preferences   Learning Barriers Reading    Learning Preferences Audio;Pictoral;Skilled Demonstration           Education Topics: Hypertension, Hypertension Reduction -Define heart disease and high blood pressure. Discus how high blood pressure affects the body and ways to reduce high blood pressure. Flowsheet Row CARDIAC REHAB PHASE II EXERCISE from 02/21/2021 in [place] CARDIAC REHABILITATION  Date 12/27/20  Educator DF  Instruction Review Code 1- Verbalizes Understanding      Exercise and Your Heart -Discuss why it is important to exercise, the FITT principles of exercise, normal and abnormal responses to exercise, and  how to exercise safely.   Angina -Discuss definition of angina, causes of angina, treatment of angina, and how to decrease risk of having angina. Flowsheet Row CARDIAC REHAB PHASE II EXERCISE from 02/21/2021 in [place] CARDIAC REHABILITATION  Date 01/10/21  Educator [facility] [place] [facility]  Instruction Review Code 1- Verbalizes Understanding      Cardiac Medications -Review what the following cardiac medications are used for, how they affect the body, and side effects that may occur when taking the medications.  Medications include Aspirin, Beta blockers, calcium channel blockers, ACE Inhibitors, angiotensin receptor blockers, diuretics, digoxin, and antihyperlipidemics. Flowsheet Row CARDIAC REHAB PHASE II EXERCISE from 02/21/2021 in [place] CARDIAC REHABILITATION  Date 01/17/21  Educator [facility]  Instruction Review Code 2- Demonstrated Understanding      Congestive Heart Failure -Discuss the definition of CHF, how to live with CHF, the signs and symptoms of CHF, and how keep track of weight and sodium intake. Flowsheet Row CARDIAC REHAB PHASE II EXERCISE from 02/21/2021 in [place] CARDIAC REHABILITATION  Date 01/24/21  Educator mk  Instruction Review Code 2-  Demonstrated Understanding      Heart Disease and Intimacy -Discus the effect sexual activity has on the heart, how changes occur during intimacy as we age, and safety during sexual activity. Flowsheet Row CARDIAC REHAB PHASE II EXERCISE from 02/21/2021 in [place] CARDIAC REHABILITATION  Date 01/31/21  Educator [facility]  Instruction Review Code 2- Demonstrated Understanding      Smoking Cessation / COPD -Discuss different methods to quit smoking, the health benefits of quitting smoking, and the definition of COPD. Flowsheet Row CARDIAC REHAB PHASE II EXERCISE from 02/21/2021 in [place] CARDIAC REHABILITATION  Date 02/07/21  Educator [facility]  Instruction Review Code 2- Demonstrated Understanding      Nutrition I: Fats -Discuss the types of cholesterol, what cholesterol does to the heart, and how cholesterol levels can be controlled. Flowsheet Row CARDIAC REHAB PHASE II EXERCISE from 02/21/2021 in [place] CARDIAC REHABILITATION  Date 02/14/21  Educator Handout  Instruction Review Code 1- Verbalizes Understanding      Nutrition II: Labels -Discuss the different components of food labels and how to read food label Flowsheet Row CARDIAC REHAB PHASE II EXERCISE from 02/21/2021 in [place] CARDIAC REHABILITATION  Date 02/21/21  Educator mk  Instruction Review Code 2- Demonstrated Understanding      Heart Parts/Heart Disease and PAD -Discuss the anatomy of the heart, the pathway of blood circulation through the heart, and these are affected by heart disease.   Stress I: Signs and Symptoms -Discuss the causes of stress, how stress may lead to anxiety and depression, and ways to limit stress.   Stress II: Relaxation -Discuss different types of relaxation techniques to limit stress.   Warning Signs of Stroke / TIA -Discuss definition of a stroke, what the signs and symptoms are of a stroke, and how to identify when someone is having stroke.   Knowledge Questionnaire Score:   Knowledge Questionnaire Score - 12/11/20 1455      Knowledge Questionnaire Score   Pre Score 12/24           Core Components/Risk Factors/Patient Goals at Admission:  Personal Goals and Risk Factors at Admission - 12/11/20 1456      Core Components/Risk Factors/Patient Goals on Admission    Weight Management Yes    Intervention Weight Management/Obesity: Establish reasonable short term and long term weight goals.    Admit Weight 229 lb (103.9 kg)    Goal  Weight: Short Term 219 lb (99.3 kg)    Goal Weight: Long Term 224 lb (101.6 kg)    Expected Outcomes Long Term: Adherence to nutrition and physical activity/exercise program aimed toward attainment of established weight goal    Diabetes Yes    Intervention Provide education about signs/symptoms and action to take for hypo/hyperglycemia.;Provide education about proper nutrition, including hydration, and aerobic/resistive exercise prescription along with prescribed medications to achieve blood glucose in normal ranges: Fasting glucose 65-99 mg/dL    Expected Outcomes Short Term: Participant verbalizes understanding of the signs/symptoms and immediate care of hyper/hypoglycemia, proper foot care and importance of medication, aerobic/resistive exercise and nutrition plan for blood glucose control.;Long Term: Attainment of HbA1C < 7%.    Personal Goal Other Yes    Personal Goal Get stronger; have more energy. Lose weight.    Intervention Patient will attend CR 3 days/week and supplement with exercise 2 days/week.    Expected Outcomes Patient will meet both personal and program goals.           Core Components/Risk Factors/Patient Goals Review:   Goals and Risk Factor Review    Row Name 12/28/20 0738 01/24/21 0855 02/21/21 0919         Core Components/Risk Factors/Patient Goals Review   Personal Goals Review Diabetes;Weight Management/Obesity Diabetes;Weight Management/Obesity Diabetes;Weight Management/Obesity     Review Patient is  new to progam completing 2 sessions. He has multiple risk factors for CAD and is participating in CR for risk modification. His last A1C on file was 6.7%. His personal goals for the program are to lose 10 lbs; get stronger; and have more energy. Will continue to monitor for progress. Patient has completed 10 sessions losing 8 lbs since he started the program. He is doing well with progressions and consistent attendance. He last A1C was 10/16/20 at 6.7%. His DM is managed with Metformin. His personal goals are to lose 10 bls; get stronger; and to have more energy. We will continue to monitor him as he works toward meeting these goals. Patient has completed 20 sessions maintaining his weight in past 30 day period. He continues to do well in the program with consistent attendance and progressions. No recent A1C's on file. He does not report home glucose readings. He say his cardiologist 2/24 for a routine check. Patient told MD he felt very good overall and is back to doing his activities. He ordered a lipid panel which patient has not completed yet. Patient says he is getting stronger and feels the program has helped  him alot. His personal goals for the program are to lost 10 lbs; get stronger; and have more energy. Patient has lost 12 lbs since his intiial visit. We will continue to monitor as he works toward meeting his goals.     Expected Outcomes Patient will complete the program meeting his personal and program goals. Patient will complete the program meeting his personal and program goals. Patient will complete the program meeting his personal and program goals.            Core Components/Risk Factors/Patient Goals at Discharge (Final Review):   Goals and Risk Factor Review - 02/21/21 0919      Core Components/Risk Factors/Patient Goals Review   Personal Goals Review Diabetes;Weight Management/Obesity    Review Patient has completed 20 sessions maintaining his weight in past 30 day period. He  continues to do well in the program with consistent attendance and progressions. No recent A1C's on file. He does not  report home glucose readings. He say his cardiologist 2/24 for a routine check. Patient told MD he felt very good overall and is back to doing his activities. He ordered a lipid panel which patient has not completed yet. Patient says he is getting stronger and feels the program has helped  him alot. His personal goals for the program are to lost 10 lbs; get stronger; and have more energy. Patient has lost 12 lbs since his intiial visit. We will continue to monitor as he works toward meeting his goals.    Expected Outcomes Patient will complete the program meeting his personal and program goals.           ITP Comments:   Comments: ITP REVIEW Pt is making expected progress toward Cardiac Rehab goals after completing 23 sessions. Recommend continued exercise, life style modification, education, and increased stamina and strength.

## 2021-03-02 ENCOUNTER — Encounter (HOSPITAL_COMMUNITY)
Admission: RE | Admit: 2021-03-02 | Discharge: 2021-03-02 | Disposition: A | Discharge status: Home / Self Care | Payer: Medicare Other | Source: Ambulatory Visit | Attending: Cardiovascular Disease | Admitting: Cardiovascular Disease

## 2021-03-02 DIAGNOSIS — I214 Non-ST elevation (NSTEMI) myocardial infarction: Secondary | ICD-10-CM | POA: Diagnosis not present

## 2021-03-02 DIAGNOSIS — Z951 Presence of aortocoronary bypass graft: Secondary | ICD-10-CM

## 2021-03-02 LAB — LIPID PANEL
Chol/HDL Ratio: 2.8 ratio (ref 0.0–5.0)
Cholesterol, Total: 116 mg/dL (ref 100–199)
HDL: 42 mg/dL (ref >39)
LDL Chol Calc (NIH): 49 mg/dL (ref 0–99)
Triglycerides: 146 mg/dL (ref 0–149)
VLDL Cholesterol Cal: 25 mg/dL (ref 5–40)

## 2021-03-02 NOTE — Progress Notes (Signed)
Daily Session Note  Patient Details  Name: Herbert Torres MRN: [id number] Date of Birth: [birth year] Referring Provider:   Flowsheet Row CARDIAC REHAB PHASE II ORIENTATION from 12/11/2020 in [place]  Referring Provider Dr. Oval Linsey      Encounter Date: 03/02/2021  Check In:  Session Check In - 03/02/21 0930      Check-In   Supervising physician immediately available to respond to emergencies [facility] MD immediately available    Physician(s) Dr. Domenic Polite    Location [facility]    Staff Present Hoy Register, MS, ACSM-CEP, Exercise Physiologist;Debra Wynetta Emery, RN, BSN    Virtual Visit No    Medication changes reported     No    Fall or balance concerns reported    No    Tobacco Cessation No Change    Warm-up and Cool-down Performed as group-led instruction    Resistance Training Performed Yes    VAD Patient? No    PAD/SET Patient? No      Pain Assessment   Currently in Pain? No/denies    Pain Score 0-No pain    Multiple Pain Sites No           Capillary Blood Glucose: No results found for this or any previous visit (from the past 24 hour(s)).    Social History   Tobacco Use  Smoking Status Former Smoker  Smokeless Tobacco Never Used  Tobacco Comment   quit over 30 years ago    Goals Met:  Independence with exercise equipment Exercise tolerated well No report of cardiac concerns or symptoms Strength training completed today  Goals Unmet:  Not Applicable  Comments: Check out 1030.   Dr. Kathie Dike is Medical Director for [facility] Pulmonary Rehab.

## 2021-03-05 ENCOUNTER — Encounter (HOSPITAL_COMMUNITY): Payer: Medicare Other

## 2021-03-07 ENCOUNTER — Encounter (HOSPITAL_COMMUNITY)
Admission: RE | Admit: 2021-03-07 | Discharge: 2021-03-07 | Disposition: A | Discharge status: Home / Self Care | Payer: Medicare Other | Source: Ambulatory Visit | Attending: Cardiovascular Disease | Admitting: Cardiovascular Disease

## 2021-03-07 ENCOUNTER — Other Ambulatory Visit: Payer: Self-pay

## 2021-03-07 DIAGNOSIS — I214 Non-ST elevation (NSTEMI) myocardial infarction: Secondary | ICD-10-CM | POA: Diagnosis not present

## 2021-03-07 DIAGNOSIS — Z951 Presence of aortocoronary bypass graft: Secondary | ICD-10-CM

## 2021-03-07 NOTE — Progress Notes (Signed)
Daily Session Note  Patient Details  Name: Herbert Torres MRN: [id number] Date of Birth: 11/15/1946 Referring Provider:   Flowsheet Row CARDIAC REHAB PHASE II ORIENTATION from 12/11/2020 in [place]  Referring Provider Dr. Oval Linsey      Encounter Date: 03/07/2021  Check In:  Session Check In - 03/07/21 0930      Check-In   Supervising physician immediately available to respond to emergencies [facility] MD immediately available    Physician(s) Dr. Harrington Challenger    Location [facility]    Staff Present Hoy Register, MS, ACSM-CEP, Exercise Physiologist;Georgiana Spillane Audria Nine, MS, Exercise Physiologist    Virtual Visit No    Medication changes reported     No    Fall or balance concerns reported    No    Tobacco Cessation No Change    Warm-up and Cool-down Performed as group-led instruction    Resistance Training Performed Yes    VAD Patient? No    PAD/SET Patient? No      Pain Assessment   Currently in Pain? No/denies    Pain Score 0-No pain    Multiple Pain Sites No           Capillary Blood Glucose: No results found for this or any previous visit (from the past 24 hour(s)).    Social History   Tobacco Use  Smoking Status Former Smoker  Smokeless Tobacco Never Used  Tobacco Comment   quit over 30 years ago    Goals Met:  Independence with exercise equipment Exercise tolerated well No report of cardiac concerns or symptoms Strength training completed today  Goals Unmet:  Not Applicable  Comments: check out 1030   Dr. Kathie Dike is Medical Director for [facility] Pulmonary Rehab.

## 2021-03-09 ENCOUNTER — Encounter (HOSPITAL_COMMUNITY)
Admission: RE | Admit: 2021-03-09 | Discharge: 2021-03-09 | Disposition: A | Discharge status: Home / Self Care | Payer: Medicare Other | Source: Ambulatory Visit | Attending: Cardiovascular Disease | Admitting: Cardiovascular Disease

## 2021-03-09 ENCOUNTER — Other Ambulatory Visit: Payer: Self-pay

## 2021-03-09 DIAGNOSIS — I214 Non-ST elevation (NSTEMI) myocardial infarction: Secondary | ICD-10-CM

## 2021-03-09 DIAGNOSIS — Z951 Presence of aortocoronary bypass graft: Secondary | ICD-10-CM

## 2021-03-09 NOTE — Progress Notes (Signed)
Daily Session Note  Patient Details  Name: Herbert Torres MRN: [id number] Date of Birth: 1946-07-08 Referring Provider:   Flowsheet Row CARDIAC REHAB PHASE II ORIENTATION from 12/11/2020 in [place]  Referring Provider Dr. Oval Linsey      Encounter Date: 03/09/2021  Check In:  Session Check In - 03/09/21 0930      Check-In   Supervising physician immediately available to respond to emergencies [facility] MD immediately available    Physician(s) Dr. Domenic Polite    Location [facility]    Staff Present Hoy Register, MS, ACSM-CEP, Exercise Physiologist;Beck Cofer Audria Nine, MS, Exercise Physiologist    Virtual Visit No    Medication changes reported     No    Fall or balance concerns reported    No    Tobacco Cessation No Change    Warm-up and Cool-down Performed as group-led instruction    Resistance Training Performed Yes    VAD Patient? No    PAD/SET Patient? No      Pain Assessment   Currently in Pain? No/denies    Pain Score 0-No pain    Multiple Pain Sites No           Capillary Blood Glucose: No results found for this or any previous visit (from the past 24 hour(s)).    Social History   Tobacco Use  Smoking Status Former Smoker  Smokeless Tobacco Never Used  Tobacco Comment   quit over 30 years ago    Goals Met:  Independence with exercise equipment Exercise tolerated well No report of cardiac concerns or symptoms Strength training completed today  Goals Unmet:  Not Applicable  Comments: check out 1030   Dr. Kathie Dike is Medical Director for [facility] Pulmonary Rehab.

## 2021-03-12 ENCOUNTER — Other Ambulatory Visit: Payer: Self-pay

## 2021-03-12 ENCOUNTER — Encounter (HOSPITAL_COMMUNITY)
Admission: RE | Admit: 2021-03-12 | Discharge: 2021-03-12 | Disposition: A | Discharge status: Home / Self Care | Payer: Medicare Other | Source: Ambulatory Visit | Attending: Cardiovascular Disease | Admitting: Cardiovascular Disease

## 2021-03-12 VITALS — Wt 213.6 lb

## 2021-03-12 DIAGNOSIS — I214 Non-ST elevation (NSTEMI) myocardial infarction: Secondary | ICD-10-CM | POA: Diagnosis not present

## 2021-03-12 DIAGNOSIS — Z951 Presence of aortocoronary bypass graft: Secondary | ICD-10-CM

## 2021-03-12 NOTE — Progress Notes (Signed)
Daily Session Note  Patient Details  Name: Herbert Torres MRN: [id number] Date of Birth: 01-Nov-1946 Referring Provider:   Flowsheet Row CARDIAC REHAB PHASE II ORIENTATION from 12/11/2020 in [place]  Referring Provider Dr. Oval Linsey      Encounter Date: 03/12/2021  Check In:  Session Check In - 03/12/21 0930      Check-In   Supervising physician immediately available to respond to emergencies [facility] MD immediately available    Physician(s) Dr. Domenic Polite    Location [facility]    Staff Present Hoy Register, MS, ACSM-CEP, Exercise Physiologist;Kinslei Labine Audria Nine, MS, Exercise Physiologist    Virtual Visit No    Medication changes reported     No    Fall or balance concerns reported    No    Tobacco Cessation No Change    Warm-up and Cool-down Performed as group-led instruction    Resistance Training Performed Yes    VAD Patient? No    PAD/SET Patient? No      Pain Assessment   Currently in Pain? No/denies    Pain Score 0-No pain    Multiple Pain Sites No           Capillary Blood Glucose: No results found for this or any previous visit (from the past 24 hour(s)).    Social History   Tobacco Use  Smoking Status Former Smoker  Smokeless Tobacco Never Used  Tobacco Comment   quit over 30 years ago    Goals Met:  Independence with exercise equipment Exercise tolerated well No report of cardiac concerns or symptoms Strength training completed today  Goals Unmet:  Not Applicable  Comments: check out 1030   Dr. Kathie Dike is Medical Director for [facility] Pulmonary Rehab.

## 2021-03-14 ENCOUNTER — Other Ambulatory Visit: Payer: Self-pay

## 2021-03-14 ENCOUNTER — Encounter (HOSPITAL_COMMUNITY)
Admission: RE | Admit: 2021-03-14 | Discharge: 2021-03-14 | Disposition: A | Discharge status: Home / Self Care | Payer: Medicare Other | Source: Ambulatory Visit | Attending: Cardiovascular Disease | Admitting: Cardiovascular Disease

## 2021-03-14 DIAGNOSIS — I214 Non-ST elevation (NSTEMI) myocardial infarction: Secondary | ICD-10-CM

## 2021-03-14 DIAGNOSIS — Z951 Presence of aortocoronary bypass graft: Secondary | ICD-10-CM

## 2021-03-14 NOTE — Progress Notes (Signed)
Daily Session Note  Patient Details  Name: Herbert Torres MRN: [id number] Date of Birth: 17-Mar-1946 Referring Provider:   Flowsheet Row CARDIAC REHAB PHASE II ORIENTATION from 12/11/2020 in [place]  Referring Provider Dr. Oval Linsey      Encounter Date: 03/14/2021  Check In:  Session Check In - 03/14/21 0930      Check-In   Supervising physician immediately available to respond to emergencies [facility] MD immediately available    Physician(s) Dr. Harl Bowie    Location [facility]    Staff Present Hoy Register, MS, ACSM-CEP, Exercise Physiologist;Chaunda Vandergriff Audria Nine, MS, Exercise Physiologist    Virtual Visit No    Medication changes reported     No    Fall or balance concerns reported    No    Tobacco Cessation No Change    Warm-up and Cool-down Performed as group-led instruction    Resistance Training Performed Yes    VAD Patient? No    PAD/SET Patient? No      Pain Assessment   Currently in Pain? No/denies    Pain Score 0-No pain    Multiple Pain Sites No           Capillary Blood Glucose: No results found for this or any previous visit (from the past 24 hour(s)).    Social History   Tobacco Use  Smoking Status Former Smoker  Smokeless Tobacco Never Used  Tobacco Comment   quit over 30 years ago    Goals Met:  Independence with exercise equipment Exercise tolerated well No report of cardiac concerns or symptoms Strength training completed today  Goals Unmet:  Not Applicable  Comments: check out 1030   Dr. Kathie Dike is Medical Director for [facility] Pulmonary Rehab.

## 2021-03-16 ENCOUNTER — Encounter (HOSPITAL_COMMUNITY)
Admission: RE | Admit: 2021-03-16 | Discharge: 2021-03-16 | Disposition: A | Discharge status: Home / Self Care | Payer: Medicare Other | Source: Ambulatory Visit | Attending: Cardiovascular Disease | Admitting: Cardiovascular Disease

## 2021-03-16 ENCOUNTER — Other Ambulatory Visit: Payer: Self-pay

## 2021-03-16 VITALS — Wt 213.6 lb

## 2021-03-16 DIAGNOSIS — I214 Non-ST elevation (NSTEMI) myocardial infarction: Secondary | ICD-10-CM

## 2021-03-16 DIAGNOSIS — Z951 Presence of aortocoronary bypass graft: Secondary | ICD-10-CM

## 2021-03-16 NOTE — Progress Notes (Signed)
Daily Session Note  Patient Details  Name: Herbert Torres MRN: [id number] Date of Birth: 09-Sep-1946 Referring Provider:   Flowsheet Row CARDIAC REHAB PHASE II ORIENTATION from 12/11/2020 in [place]  Referring Provider Dr. Oval Linsey      Encounter Date: 03/16/2021  Check In:  Session Check In - 03/16/21 0930      Check-In   Supervising physician immediately available to respond to emergencies [facility] MD immediately available    Physician(s) Dr. Harrington Challenger    Location [facility]    Staff Present Hoy Register, MS, ACSM-CEP, Exercise Physiologist;Abena Erdman Audria Nine, MS, Exercise Physiologist    Virtual Visit No    Medication changes reported     No    Fall or balance concerns reported    No    Tobacco Cessation No Change    Warm-up and Cool-down Performed as group-led instruction    Resistance Training Performed Yes    VAD Patient? No    PAD/SET Patient? No      Pain Assessment   Currently in Pain? No/denies    Pain Score 0-No pain    Multiple Pain Sites No           Capillary Blood Glucose: No results found for this or any previous visit (from the past 24 hour(s)).    Social History   Tobacco Use  Smoking Status Former Smoker  Smokeless Tobacco Never Used  Tobacco Comment   quit over 30 years ago    Goals Met:  Independence with exercise equipment Exercise tolerated well No report of cardiac concerns or symptoms Strength training completed today  Goals Unmet:  Not Applicable  Comments: check out 1030   Dr. Kathie Dike is Medical Director for [facility] Pulmonary Rehab.

## 2021-03-20 ENCOUNTER — Telehealth: Payer: Self-pay | Admitting: Cardiovascular Disease

## 2021-03-20 DIAGNOSIS — I4819 Other persistent atrial fibrillation: Secondary | ICD-10-CM

## 2021-03-20 DIAGNOSIS — Z79899 Other long term (current) drug therapy: Secondary | ICD-10-CM

## 2021-03-20 MED ORDER — APIXABAN 5 MG PO TABS: 5.0000 mg | ORAL_TABLET | Freq: Two times a day (BID) | ORAL | 0 refills | Status: AC

## 2021-03-20 NOTE — Progress Notes (Signed)
Discharge Progress Report  Patient Details  Name: Herbert Torres MRN: [id number] Date of Birth: [birth year] Referring Provider:   Flowsheet Row CARDIAC REHAB PHASE II ORIENTATION from 12/11/2020 in [place]  Referring Provider Dr. Oval Linsey       Number of Visits: 30  Reason for Discharge:  Patient reached a stable level of exercise. Patient independent in their exercise. Patient has met program and personal goals.  Smoking History:  Social History   Tobacco Use  Smoking Status Former Smoker  Smokeless Tobacco Never Used  Tobacco Comment   quit over 30 years ago    Diagnosis:  S/P CABG x 4  NSTEMI (non-ST elevated myocardial infarction) ([place])  ADL [facility]:   Initial Exercise Prescription:  Initial Exercise Prescription - 12/11/20 1400      Date of Initial Exercise RX and Referring Provider   Date 12/11/20    Referring Provider Dr. Oval Linsey    Expected Discharge Date 03/16/21      NuStep   Level 1    SPM 80    Minutes 22      Arm Ergometer   Level 1    RPM 60    Minutes 17      Prescription Details   Frequency (times per week) 2    Duration Progress to 30 minutes of continuous aerobic without signs/symptoms of physical distress      Intensity   THRR 40-80% of Max Heartrate 58-115    Ratings of Perceived Exertion 11-13    Perceived Dyspnea 0-4      Resistance Training   Training Prescription Yes    Weight 3 lbs    Reps 10-15           Discharge Exercise Prescription (Final Exercise Prescription Changes):  Exercise Prescription Changes - 03/12/21 1000      Response to Exercise   Blood Pressure (Admit) 126/54    Blood Pressure (Exercise) 144/62    Blood Pressure (Exit) 116/64    Heart Rate (Admit) 55 bpm    Heart Rate (Exercise) 95 bpm    Heart Rate (Exit) 64 bpm    Rating of Perceived Exertion (Exercise) 13    Duration Continue with 30 min of aerobic exercise without signs/symptoms of physical distress.     Intensity THRR unchanged      Progression   Progression Continue to progress workloads to maintain intensity without signs/symptoms of physical distress.      Resistance Training   Training Prescription Yes    Weight 4 lbs    Reps 10-15    Time 10 Minutes      NuStep   Level 3    SPM 98    Minutes 17    METs 2.5      Arm Ergometer   Level 3    RPM 50    Minutes 22    METs 2.1           Functional Capacity:  6 Minute Walk    Row Name 12/11/20 1421 03/16/21 1010       6 Minute Walk   Phase Initial Discharge    Distance 1200 feet 1200 feet    Walk Time 6 minutes 6 minutes    # of Rest Breaks 0 0    MPH 2.27 2.3    METS 2.41 2.31    RPE 14 11    VO2 Peak 8.44 8.07    Symptoms No No    Resting HR 60 bpm  68 bpm    Resting BP 140/50 138/64    Resting Oxygen Saturation  95 % 97 %    Exercise Oxygen Saturation  during 6 min walk 97 % 96 %    Max Ex. HR 80 bpm 72 bpm    Max Ex. BP 180/72 156/60    2 Minute Post BP 156/64 144/50           Psychological, QOL, Others - Outcomes: PHQ 2/9: Depression screen [facility] 2/9 03/20/2021 12/11/2020  Decreased Interest 0 0  Down, Depressed, Hopeless 0 0  PHQ - 2 Score 0 0  Altered sleeping 3 2  Tired, decreased energy 1 1  Change in appetite 3 0  Feeling bad or failure about yourself  0 0  Trouble concentrating 1 0  Moving slowly or fidgety/restless 1 0  Suicidal thoughts 0 0  PHQ-9 Score 9 3  Difficult doing work/chores Somewhat difficult Not difficult at all    Quality of Life:  Quality of Life - 03/20/21 1611      Quality of Life   Select Quality of Life      Quality of Life Scores   Health/Function Pre 22.73 %    Health/Function Post 24.63 %    Health/Function % Change 8.36 %    Socioeconomic Pre 23.21 %    Socioeconomic Post 22.88 %    Socioeconomic % Change  -1.42 %    Psych/Spiritual Pre 24.43 %    Psych/Spiritual Post 23.57 %    Psych/Spiritual % Change -3.52 %    Family Pre 26.1 %    Family Post  24.6 %    Family % Change -5.75 %    GLOBAL Pre 23.68 %    GLOBAL Post 24.01 %    GLOBAL % Change 1.39 %           Personal Goals: Goals established at orientation with interventions provided to work toward goal.  Personal Goals and Risk Factors at Admission - 12/11/20 1456      Core Components/Risk Factors/Patient Goals on Admission    Weight Management Yes    Intervention Weight Management/Obesity: Establish reasonable short term and long term weight goals.    Admit Weight 229 lb (103.9 kg)    Goal Weight: Short Term 219 lb (99.3 kg)    Goal Weight: Long Term 224 lb (101.6 kg)    Expected Outcomes Long Term: Adherence to nutrition and physical activity/exercise program aimed toward attainment of established weight goal    Diabetes Yes    Intervention Provide education about signs/symptoms and action to take for hypo/hyperglycemia.;Provide education about proper nutrition, including hydration, and aerobic/resistive exercise prescription along with prescribed medications to achieve blood glucose in normal ranges: Fasting glucose 65-99 mg/dL    Expected Outcomes Short Term: Participant verbalizes understanding of the signs/symptoms and immediate care of hyper/hypoglycemia, proper foot care and importance of medication, aerobic/resistive exercise and nutrition plan for blood glucose control.;Long Term: Attainment of HbA1C < 7%.    Personal Goal Other Yes    Personal Goal Get stronger; have more energy. Lose weight.    Intervention Patient will attend CR 3 days/week and supplement with exercise 2 days/week.    Expected Outcomes Patient will meet both personal and program goals.            Personal Goals Discharge:  Goals and Risk Factor Review    Row Name 12/28/20 0738 01/24/21 0855 02/21/21 0919 03/20/21 1620       Core Components/Risk  Factors/Patient Goals Review   Personal Goals Review Diabetes;Weight Management/Obesity Diabetes;Weight Management/Obesity Diabetes;Weight  Management/Obesity Diabetes;Weight Management/Obesity    Review Patient is new to progam completing 2 sessions. He has multiple risk factors for CAD and is participating in CR for risk modification. His last A1C on file was 6.7%. His personal goals for the program are to lose 10 lbs; get stronger; and have more energy. Will continue to monitor for progress. Patient has completed 10 sessions losing 8 lbs since he started the program. He is doing well with progressions and consistent attendance. He last A1C was 10/16/20 at 6.7%. His DM is managed with Metformin. His personal goals are to lose 10 bls; get stronger; and to have more energy. We will continue to monitor him as he works toward meeting these goals. Patient has completed 20 sessions maintaining his weight in past 30 day period. He continues to do well in the program with consistent attendance and progressions. No recent A1C's on file. He does not report home glucose readings. He say his cardiologist 2/24 for a routine check. Patient told MD he felt very good overall and is back to doing his activities. He ordered a lipid panel which patient has not completed yet. Patient says he is getting stronger and feels the program has helped  him alot. His personal goals for the program are to lost 10 lbs; get stronger; and have more energy. Patient has lost 12 lbs since his intiial visit. We will continue to monitor as he works toward meeting his goals. Patient had completed 30 sessions at discharge. He did very well with attendance in the program as well as progressing exercise intensities. He had a goal to lose 10lbs in the program and he lost 14lbs since the start of the program. He also got a lot stronger throughout the program. He says that the program has helped him with his ADLs.    Expected Outcomes Patient will complete the program meeting his personal and program goals. Patient will complete the program meeting his personal and program goals. Patient will  complete the program meeting his personal and program goals. --           Exercise Goals and Review:  Exercise Goals    Row Name 12/11/20 1424 01/01/21 1248 01/29/21 1135 02/26/21 1042       Exercise Goals   Increase Physical Activity Yes Yes Yes Yes    Intervention Provide advice, education, support and counseling about physical activity/exercise needs.;Develop an individualized exercise prescription for aerobic and resistive training based on initial evaluation findings, risk stratification, comorbidities and participant's personal goals. Provide advice, education, support and counseling about physical activity/exercise needs.;Develop an individualized exercise prescription for aerobic and resistive training based on initial evaluation findings, risk stratification, comorbidities and participant's personal goals. Provide advice, education, support and counseling about physical activity/exercise needs.;Develop an individualized exercise prescription for aerobic and resistive training based on initial evaluation findings, risk stratification, comorbidities and participant's personal goals. Provide advice, education, support and counseling about physical activity/exercise needs.;Develop an individualized exercise prescription for aerobic and resistive training based on initial evaluation findings, risk stratification, comorbidities and participant's personal goals.    Expected Outcomes Short Term: Attend rehab on a regular basis to increase amount of physical activity.;Long Term: Add in home exercise to make exercise part of routine and to increase amount of physical activity.;Long Term: Exercising regularly at least 3-5 days a week. Short Term: Attend rehab on a regular basis to increase amount of physical activity.;Long  Term: Add in home exercise to make exercise part of routine and to increase amount of physical activity.;Long Term: Exercising regularly at least 3-5 days a week. Short Term: Attend  rehab on a regular basis to increase amount of physical activity.;Long Term: Add in home exercise to make exercise part of routine and to increase amount of physical activity.;Long Term: Exercising regularly at least 3-5 days a week. Short Term: Attend rehab on a regular basis to increase amount of physical activity.;Long Term: Add in home exercise to make exercise part of routine and to increase amount of physical activity.;Long Term: Exercising regularly at least 3-5 days a week.    Increase Strength and Stamina Yes Yes Yes Yes    Intervention Provide advice, education, support and counseling about physical activity/exercise needs.;Develop an individualized exercise prescription for aerobic and resistive training based on initial evaluation findings, risk stratification, comorbidities and participant's personal goals. Provide advice, education, support and counseling about physical activity/exercise needs.;Develop an individualized exercise prescription for aerobic and resistive training based on initial evaluation findings, risk stratification, comorbidities and participant's personal goals. Provide advice, education, support and counseling about physical activity/exercise needs.;Develop an individualized exercise prescription for aerobic and resistive training based on initial evaluation findings, risk stratification, comorbidities and participant's personal goals. Provide advice, education, support and counseling about physical activity/exercise needs.;Develop an individualized exercise prescription for aerobic and resistive training based on initial evaluation findings, risk stratification, comorbidities and participant's personal goals.    Expected Outcomes Short Term: Increase workloads from initial exercise prescription for resistance, speed, and METs.;Short Term: Perform resistance training exercises routinely during rehab and add in resistance training at home;Long Term: Improve cardiorespiratory  fitness, muscular endurance and strength as measured by increased METs and functional capacity (6MWT) Short Term: Increase workloads from initial exercise prescription for resistance, speed, and METs.;Short Term: Perform resistance training exercises routinely during rehab and add in resistance training at home;Long Term: Improve cardiorespiratory fitness, muscular endurance and strength as measured by increased METs and functional capacity (6MWT) Short Term: Increase workloads from initial exercise prescription for resistance, speed, and METs.;Short Term: Perform resistance training exercises routinely during rehab and add in resistance training at home;Long Term: Improve cardiorespiratory fitness, muscular endurance and strength as measured by increased METs and functional capacity (6MWT) Short Term: Increase workloads from initial exercise prescription for resistance, speed, and METs.;Short Term: Perform resistance training exercises routinely during rehab and add in resistance training at home;Long Term: Improve cardiorespiratory fitness, muscular endurance and strength as measured by increased METs and functional capacity (6MWT)    Able to understand and use rate of perceived exertion (RPE) scale Yes Yes Yes Yes    Intervention Provide education and explanation on how to use RPE scale Provide education and explanation on how to use RPE scale Provide education and explanation on how to use RPE scale Provide education and explanation on how to use RPE scale    Expected Outcomes Short Term: Able to use RPE daily in rehab to express subjective intensity level;Long Term:  Able to use RPE to guide intensity level when exercising independently Short Term: Able to use RPE daily in rehab to express subjective intensity level;Long Term:  Able to use RPE to guide intensity level when exercising independently Short Term: Able to use RPE daily in rehab to express subjective intensity level;Long Term:  Able to use RPE to  guide intensity level when exercising independently Short Term: Able to use RPE daily in rehab to express subjective intensity level;Long Term:  Able to use RPE to guide intensity level when exercising independently    Knowledge and understanding of Target Heart Rate Range (THRR) Yes Yes Yes Yes    Intervention Provide education and explanation of THRR including how the numbers were predicted and where they are located for reference Provide education and explanation of THRR including how the numbers were predicted and where they are located for reference Provide education and explanation of THRR including how the numbers were predicted and where they are located for reference Provide education and explanation of THRR including how the numbers were predicted and where they are located for reference    Expected Outcomes Short Term: Able to state/look up THRR;Long Term: Able to use THRR to govern intensity when exercising independently;Short Term: Able to use daily as guideline for intensity in rehab Short Term: Able to state/look up THRR;Long Term: Able to use THRR to govern intensity when exercising independently;Short Term: Able to use daily as guideline for intensity in rehab Short Term: Able to state/look up THRR;Long Term: Able to use THRR to govern intensity when exercising independently;Short Term: Able to use daily as guideline for intensity in rehab Short Term: Able to state/look up THRR;Long Term: Able to use THRR to govern intensity when exercising independently;Short Term: Able to use daily as guideline for intensity in rehab    Able to check pulse independently Yes Yes Yes Yes    Intervention Provide education and demonstration on how to check pulse in carotid and radial arteries.;Review the importance of being able to check your own pulse for safety during independent exercise Provide education and demonstration on how to check pulse in carotid and radial arteries.;Review the importance of being  able to check your own pulse for safety during independent exercise Provide education and demonstration on how to check pulse in carotid and radial arteries.;Review the importance of being able to check your own pulse for safety during independent exercise Provide education and demonstration on how to check pulse in carotid and radial arteries.;Review the importance of being able to check your own pulse for safety during independent exercise    Expected Outcomes Short Term: Able to explain why pulse checking is important during independent exercise;Long Term: Able to check pulse independently and accurately Short Term: Able to explain why pulse checking is important during independent exercise;Long Term: Able to check pulse independently and accurately Short Term: Able to explain why pulse checking is important during independent exercise;Long Term: Able to check pulse independently and accurately Short Term: Able to explain why pulse checking is important during independent exercise;Long Term: Able to check pulse independently and accurately    Understanding of Exercise Prescription Yes Yes Yes Yes    Intervention Provide education, explanation, and written materials on patient's individual exercise prescription Provide education, explanation, and written materials on patient's individual exercise prescription Provide education, explanation, and written materials on patient's individual exercise prescription Provide education, explanation, and written materials on patient's individual exercise prescription    Expected Outcomes Short Term: Able to explain program exercise prescription;Long Term: Able to explain home exercise prescription to exercise independently Short Term: Able to explain program exercise prescription;Long Term: Able to explain home exercise prescription to exercise independently Short Term: Able to explain program exercise prescription;Long Term: Able to explain home exercise prescription to  exercise independently Short Term: Able to explain program exercise prescription;Long Term: Able to explain home exercise prescription to exercise independently           Exercise Goals Re-Evaluation:  Exercise Goals Re-Evaluation    [place] Name 01/01/21 1248 01/29/21 1135 02/26/21 1042 03/20/21 1615       Exercise Goal Re-Evaluation   Exercise Goals Review Increase Physical Activity;Increase Strength and Stamina;Able to understand and use rate of perceived exertion (RPE) scale;Knowledge and understanding of Target Heart Rate Range (THRR);Able to check pulse independently;Understanding of Exercise Prescription Increase Physical Activity;Increase Strength and Stamina;Able to understand and use rate of perceived exertion (RPE) scale;Knowledge and understanding of Target Heart Rate Range (THRR);Able to check pulse independently;Understanding of Exercise Prescription Increase Physical Activity;Increase Strength and Stamina;Able to understand and use rate of perceived exertion (RPE) scale;Knowledge and understanding of Target Heart Rate Range (THRR);Able to check pulse independently;Understanding of Exercise Prescription Increase Physical Activity;Increase Strength and Stamina;Able to understand and use rate of perceived exertion (RPE) scale;Knowledge and understanding of Target Heart Rate Range (THRR);Able to check pulse independently;Understanding of Exercise Prescription    Comments Pt has attended 3 exercise sessions. He has a positive attitude and works hard while he is in rehab. He is currently exercising at 2.3 METs on the stepper. His BP has been elevated at both rest and during exercise so far. If this continues to be a trend, we will inform his cardiologist. Will continue to monitor and progress as able. Pt has attended 11 exercise sessions. He continues to push himself in rehab and has been able to increase his workloads. His resting BP has came down to within normal limits. He curretnly exercises at  2.3 METs on the stepper. Will continue to monitor and progress as able. Patient has completed 22 exercise sessions. He is progressing well and is able to increase his workloads well. He is exercising at home and says that is going well. We discussed increasing his exercise time at home to 25-30 min per day outside of rehab. He feels he is getting stronger. He is currently working at 2.2 METs on the NuStep. Will continue to monitor and progress as able. patient has completed 30 sessions before discharge. He progressed well throughou this time in the program. He plans to continue to exercise at home by walking with a friend. He felt the program helped him get stronger and helped him improve ADLs. He ended exercising at rehab at a 2.31 MET level.    Expected Outcomes Through exercise at rehab and by engaging in a home exercise program the patient will reach their goals. Through exercise at rehab and by engaging in a home exercise program the patient will reach their goals. Through exercise at rehab and by engaging in a home exercise program the patient will reach their goals. --           Nutrition & Weight - Outcomes:  Pre Biometrics - 03/16/21 1012      Pre Biometrics   Weight 96.9 kg    Waist Circumference 43.25 inches    Hip Circumference 44.5 inches    Waist to Hip Ratio 0.97 %    Triceps Skinfold 13 mm    % Body Fat 28.5 %    Grip Strength 32.9 kg    Flexibility 0 in    Single Leg Stand 4.88 seconds            Nutrition:  Nutrition Therapy & Goals - 02/21/21 0918      Personal Nutrition Goals   Comments We will continue to provide nutritional heart healthy education through handouts.      Intervention Plan   Intervention Nutrition handout(s) given to patient.  Nutrition Discharge:  Nutrition Assessments - 03/20/21 1612      MEDFICTS Scores   Pre Score 63    Post Score 33    Score Difference -30           Education Questionnaire Score:  Knowledge  Questionnaire Score - 03/20/21 1612      Knowledge Questionnaire Score   Pre Score 12/24    Post Score 20/24          Patient graduated from [place] today on 03-16-21 after completing 30 sessions. They achieved LTG of 30 minutes of aerobic exercise at Max Met level of 2.31/ All patients vitals are WNL. Discharge instruction has been reviewed in detail and patient stated an understanding of material given. Patient plans to exercise at home. Cardiac Rehab staff will make f/u call. Patient had no complaints of any abnormal S/S or pain on their exit visit.     Goals reviewed with patient; copy given to patient.

## 2021-03-20 NOTE — Telephone Encounter (Signed)
Spoke to patient daughter Zenovia Jordan. She states patient was  Accepted for  Patient assistance in Dec 2021. She was expecting  Medication to come , [employer] representative informed her the application was only good until Dec 2021. Patient would need to reapply. RN recommend Marcelino Duster to go had and fill out patient assistance ,so if Company needs paperwork office can fax information , Fax number given,and who to send it too.  Marcelino Duster verbalized understanding.

## 2021-03-20 NOTE — Telephone Encounter (Signed)
Patient calling the office for samples of medication:   1.  What medication and dosage are you requesting samples for? apixaban (ELIQUIS) 5 MG TABS tablet  2.  Are you currently out of this medication? Will be out Friday.

## 2021-03-27 ENCOUNTER — Other Ambulatory Visit: Payer: Self-pay | Admitting: Physician Assistant

## 2021-04-23 ENCOUNTER — Other Ambulatory Visit: Payer: Self-pay | Admitting: Cardiovascular Disease

## 2021-06-09 ENCOUNTER — Other Ambulatory Visit: Payer: Self-pay | Admitting: Physician Assistant

## 2021-06-12 ENCOUNTER — Other Ambulatory Visit: Payer: Self-pay | Admitting: Physician Assistant

## 2021-06-13 ENCOUNTER — Telehealth: Payer: Self-pay | Admitting: Cardiovascular Disease

## 2021-06-13 MED ORDER — AMIODARONE HCL 200 MG PO TABS
200.0000 mg | ORAL_TABLET | Freq: Every day | ORAL | 2 refills | Status: DC
Start: 2021-06-13 — End: 2021-09-04

## 2021-06-13 NOTE — Telephone Encounter (Signed)
*  STAT* If patient is at the pharmacy, call can be transferred to refill team.   1. Which medications need to be refilled? (please list name of each medication and dose if known) new prescription for Amiodarone- changing pharmacy  2. Which pharmacy/location (including street and city if local pharmacy) is medication to be sent to?[facility] [phone]  3. Do they need a 30 day or 90 day supply? 90 days and refills

## 2021-06-13 NOTE — Telephone Encounter (Signed)
Let patient know that I sent in refills for his Amiodarone over to the pharmacy.

## 2021-08-01 ENCOUNTER — Other Ambulatory Visit: Payer: Self-pay | Admitting: Cardiovascular Disease

## 2021-08-01 NOTE — Telephone Encounter (Signed)
Rx request sent to pharmacy.  

## 2021-09-03 NOTE — Progress Notes (Signed)
Cardiology Office Note:    Date:  09/04/2021   ID:  Gilles, Trimpe [birth year], MRN [id number]  PCP:  Center, [facility] Providers Cardiologist:  Chilton Si, MD     Referring MD: Center, [facility]*   No chief complaint on file.   History of Present Illness:    Herbert Torres is a 75 y.o. male with a hx of coronary artery disease, diabetes mellitus, GERD, hyperlipidemia, and hypertension, here for post-hospital follow-up. He presented to the hospital 09/2020 with NSTEMI. Cath revealed multi-vessel CAD. He underwent CABG x4 and left atrial clipping on 10/18/20. His hospitalization was complicated by post-op atrial fibrillation. He converted to sinus rhythm on amiodarone. He also had a pleural effusion that required drainage. He went back into atrial fibrillation and had a cardioversion 10/28/20. He followed up with Edd Fabian, NP 01/2020 and was doing well.  He is accompanied by his son. Today, he is feeling good overall. At home his blood pressure is averaging 135 systolic, and his heart rate is usually in the 50s. His blood pressure is typically higher in clinic. He denies any recurrent episodes of atrial fibrillation. Prior to his hospital visit he was on amiodarone due to intermittent flutters and arrhythmia. He lost weight mostly due to his previous hospital stay, and has not regained weight. For exercise he is walking 1.5 miles every morning, and feels well while exercising. His breathing is good, he reports not needing to pant when we walks. Also, he is experiencing urinary frequency, but is not currently on a diuretic. He denies any chest pain, or shortness of breath. No lightheadedness, headaches, syncope, orthopnea, or PND. Also has no lower extremity edema.   Past Medical History:  Diagnosis Date   Coronary artery disease    Diabetes mellitus without complication (HCC)    GERD (gastroesophageal reflux disease)    HOH (hard of  hearing)    AIDS   Hyperlipidemia    Hypertension     Past Surgical History:  Procedure Laterality Date   APPENDECTOMY     CARDIOVERSION N/A 10/27/2020   Procedure: CARDIOVERSION;  Surgeon: Lewayne Bunting, MD;  Location: [facility] ENDOSCOPY;  Service: Cardiovascular;  Laterality: N/A;   CATARACT EXTRACTION W/PHACO Right 02/26/2018   Procedure: CATARACT EXTRACTION PHACO AND INTRAOCULAR LENS PLACEMENT (IOC);  Surgeon: Nevada Crane, MD;  Location: [facility] ORS;  Service: Ophthalmology;  Laterality: Right;  fluid pak loT# [id number]  exp10/31/2020 [place]   00:38.9 AP%   16.3 CDE   6.33   CLIPPING OF ATRIAL APPENDAGE Left 10/18/2020   Procedure: CLIPPING OF LEFT ATRIAL APPENDAGE USING ATRICURE ATRICLIP SIZE 40;  Surgeon: Corliss Skains, MD;  Location: [facility] OR;  Service: Open Heart Surgery;  Laterality: Left;   COLONOSCOPY     CORONARY ARTERY BYPASS GRAFT N/A 10/18/2020   Procedure: CORONARY ARTERY BYPASS GRAFTING (CABG) TIMES FOUR USING LEFT INTERNAL MAMMARY ARTERY AND RIGHT LEG GREATER SAPHENOUS VEIN HARVESTED ENDOSCOPICALLY Flowtrack only;  Surgeon: Corliss Skains, MD;  Location: [facility] OR;  Service: Open Heart Surgery;  Laterality: N/A;   IR THORACENTESIS ASP PLEURAL SPACE W/IMG GUIDE  10/23/2020   LEFT HEART CATH AND CORONARY ANGIOGRAPHY N/A 10/16/2020   Procedure: LEFT HEART CATH AND CORONARY ANGIOGRAPHY;  Surgeon: Marykay Lex, MD;  Location: [facility] INVASIVE CV LAB;  Service: Cardiovascular;  Laterality: N/A;   TEE WITHOUT CARDIOVERSION N/A 10/18/2020   Procedure: TRANSESOPHAGEAL ECHOCARDIOGRAM (TEE);  Surgeon: Corliss Skains, MD;  Location:  [facility] OR;  Service: Open Heart Surgery;  Laterality: N/A;    Current Medications: Current Meds  Medication Sig   apixaban (ELIQUIS) 5 MG TABS tablet Take 1 tablet (5 mg total) by mouth in the morning and at bedtime.   aspirin EC 81 MG EC tablet Take 1 tablet (81 mg total) by mouth daily. Swallow whole.   dapagliflozin propanediol (FARXIGA) 10 MG  TABS tablet Take 1 tablet (10 mg total) by mouth daily before breakfast.   digoxin (LANOXIN) 0.25 MG tablet TAKE 1 TABLET BY MOUTH EVERY DAY   doxazosin (CARDURA) 2 MG tablet Take 1 tablet (2 mg total) by mouth at bedtime.   ezetimibe (ZETIA) 10 MG tablet TAKE 1 TABLET BY MOUTH EVERY DAY   metFORMIN (GLUCOPHAGE) 1000 MG tablet Take 1,000 mg by mouth 2 (two) times daily.   omeprazole (PRILOSEC) 20 MG capsule Take 20 mg by mouth daily.   rosuvastatin (CRESTOR) 20 MG tablet Take 1 tablet (20 mg total) by mouth daily.   [DISCONTINUED] amiodarone (PACERONE) 200 MG tablet Take 1 tablet (200 mg total) by mouth daily.   [DISCONTINUED] carvedilol (COREG) 6.25 MG tablet TAKE 1 TABLET BY MOUTH 2 TIMES DAILY WITH A MEAL.     Allergies:   Atorvastatin   Social History   Socioeconomic History   Marital status: Divorced    Spouse name: Not on file   Number of children: Not on file   Years of education: Not on file   Highest education level: Not on file  Occupational History   Not on file  Tobacco Use   Smoking status: Former   Smokeless tobacco: Never   Tobacco comments:    quit over 30 years ago  Vaping Use   Vaping Use: Never used  Substance and Sexual Activity   Alcohol use: No   Drug use: Not on file   Sexual activity: Not on file  Other Topics Concern   Not on file  Social History Narrative   Not on file   Social Determinants of Health   Financial Resource Strain: Not on file  Food Insecurity: Not on file  Transportation Needs: Not on file  Physical Activity: Not on file  Stress: Not on file  Social Connections: Not on file     Family History: The patient's family history includes Heart attack in his brother, father, mother, and sister.  ROS:   Please see the history of present illness.    (+) Urinary frequency All other systems reviewed and are negative.  EKGs/Labs/Other Studies Reviewed:    The following studies were reviewed today:  Intraoperative TEE  10/18/2020: POST-OP IMPRESSIONS  - Aorta: The aorta appears unchanged from pre-bypass.  - Aortic Valve: The aortic valve appears unchanged from pre-bypass.  - Mitral Valve: The mitral valve appears unchanged from pre-bypass.  - Tricuspid Valve: The tricuspid valve appears unchanged from pre-bypass.  - Interatrial Septum: The interatrial septum appears unchanged from  pre-bypass.   Echo 10/16/2020:  1. Left ventricular ejection fraction, by estimation, is 50 to 55%. The  left ventricle has low normal function. The left ventricle demonstrates  regional wall motion abnormalities (see scoring diagram/findings for  description). Left ventricular diastolic   parameters are consistent with Grade I diastolic dysfunction (impaired  relaxation).   2. Right ventricular systolic function is normal. The right ventricular  size is mildly enlarged.   3. Left atrial size was mild to moderately dilated.   4. The mitral valve is grossly normal. No evidence  of mitral valve  regurgitation.   5. The aortic valve was not well visualized. Aortic valve regurgitation  is not visualized.   6. The inferior vena cava is normal in size with <50% respiratory  variability, suggesting right atrial pressure of 8 mmHg.   Comparison(s): No prior Echocardiogram.   LHC 10/16/2020: Mid RCA to Dist RCA lesion is 100% stenosed. RV Branch lesion is 90% stenosed. --------------------------- Prox LAD to Mid LAD lesion is 80% stenosed with 70% stenosed side branch in 1st Diag & 1st Sept lesion is 70% stenosed. Mid LAD lesion is 75% stenosed. --------------------------- Prox Cx lesion is 75% stenosed. Lat 3rd Mrg lesion is 80% stenosed. Diminutive 4th Mrg lesion is 90% stenosed. Dist Cx / OM lesion is 90% stenosed. =================== There is no aortic valve stenosis. LV end diastolic pressure is normal. The left ventricular systolic function is normal. There is trivial (1+) mitral regurgitation.   SUMMARY Severe  multivessel CAD:  Possible subacute occlusion or CTO of RCA after RV marginal branch that has an 90% stenosis, with minimal left to right collaterals;  Focal eccentric calcified 80% stenosis of the LAD at the takeoff of SP1 (1st Sept) and D1 (1st Diag) along with eccentric 70% mid LAD, and  75% proximal major LCx that has diffuse areas of relatively severe stenosis in the sidebranches. Relatively normal EF with mild inferior hypokinesis.  Relatively normal LVEDP.     RECOMMENDATIONS With diabetes and existing A. fib on Eliquis already, best option for this gentleman is CVTS consult for CABG. Continue aggressive risk factor modification.   Results reviewed with Dr. [place].  CVTS consult called  EKG:   09/04/2021: Sinus bradycardia. Rate 49 bpm. Non-specific T wave abnormalities.  Recent Labs: 10/15/2020: ALT 23; B Natriuretic Peptide 154.0 10/19/2020: Magnesium 1.9 10/23/2020: Hemoglobin 11.7; Platelets 441 10/24/2020: TSH 0.975 10/27/2020: BUN 29; Creatinine, Ser 1.28; Potassium 4.0; Sodium 132  Recent Lipid Panel    Component Value Date/Time   CHOL 116 03/01/2021 0827   TRIG 146 03/01/2021 0827   HDL 42 03/01/2021 0827   CHOLHDL 2.8 03/01/2021 0827   CHOLHDL 5.7 10/16/2020 0148   VLDL 46 (H) 10/16/2020 0148   LDLCALC 49 03/01/2021 0827     Risk Assessment/Calculations:    CHA2DS2-VASc Score = 5   This indicates a 7.2% annual risk of stroke. The patient's score is based upon: CHF History: 0 HTN History: 1 Diabetes History: 1 Stroke History: 0 Vascular Disease History: 1 Age Score: 2 Gender Score: 0          Physical Exam:    Wt Readings from Last 3 Encounters:  09/04/21 210 lb 8 oz (95.5 kg)  03/16/21 213 lb 10 oz (96.9 kg)  03/12/21 213 lb 10 oz (96.9 kg)    VS:  BP (!) 156/62 (BP Location: Right Arm, Patient Position: Sitting)   Pulse (!) 49   Ht 6\' 1"  (1.854 m)   Wt 210 lb 8 oz (95.5 kg)   BMI 27.77 kg/m  , BMI Body mass index is 27.77 kg/m. GENERAL:   Well appearing HEENT: Pupils equal round and reactive, fundi not visualized, oral mucosa unremarkable NECK:  No jugular venous distention, waveform within normal limits, carotid upstroke brisk and symmetric, no bruits LUNGS:  Clear to auscultation bilaterally HEART:  RRR.  PMI not displaced or sustained,S1 and S2 within normal limits, no S3, no S4, no clicks, no rubs, no murmurs ABD:  Flat, positive bowel sounds normal in frequency in pitch, no bruits,  no rebound, no guarding, no midline pulsatile mass, no hepatomegaly, no splenomegaly EXT:  2 plus pulses throughout, no edema, no cyanosis no clubbing SKIN:  No rashes no nodules NEURO:  Cranial nerves II through XII grossly intact, motor grossly intact throughout PSYCH:  Cognitively intact, oriented to person place and time   ASSESSMENT:    1. Therapeutic drug monitoring   2. Essential hypertension   3. Paroxysmal atrial fibrillation (HCC)   4. S/P CABG x 4    PLAN:    Essential hypertension Blood pressure was poorly controlled in the office.  At home he reports that his in the 130s.  It should be less than 130/80.  He is bradycardic on his EKG. he is asymptomatic, but this is too low.  We will reduce the carvedilol to 3.125 mg twice daily.  He also reports issues likely related to his prostate.  We will add doxazosin 2 mg nightly.  He will track his blood pressures and bring to follow-up with pharmacist.  Given his diabetes, ideally he should be on an ARB or ACE inhibitor.  However he reports medication fatigue so we will start with this.  Atrial fibrillation (HCC) No recent episodes.  Reducing carvedilol due to bradycardia.  He was started on amiodarone in the hospital for postoperative atrial fibrillation, though he had A. fib prior to admission.  He underwent left atrial appendage clipping.  We will stop the amiodarone due to concern for long-term side effects.  If he has recurrent atrial fibrillation, would prefer an alternate  antiarrhythmic such as dofetilide.  Continue Eliquis.  S/P CABG x 4 He is doing well status post CABG.  No angina.  Check fasting lipids and CMP today.  LDL goal is less than 70.  Continue rosuvastatin and Zetia.  Continue Farxiga.        Disposition: FU with APP/PharmD in 1 month. FU in [place] clinic in 6 months per pt preference.   Medication Adjustments/Labs and Tests Ordered: Current medicines are reviewed at length with the patient today.  Concerns regarding medicines are outlined above.   Orders Placed This Encounter  Procedures   TSH   Lipid panel   Comprehensive metabolic panel   EKG 12-Lead    Meds ordered this encounter  Medications   doxazosin (CARDURA) 2 MG tablet    Sig: Take 1 tablet (2 mg total) by mouth at bedtime.    Dispense:  90 tablet    Refill:  1   carvedilol (COREG) 3.125 MG tablet    Sig: Take 1 tablet (3.125 mg total) by mouth 2 (two) times daily with a meal.    Dispense:  180 tablet    Refill:  3    D/C 6.25 MG RX, NEW DOSE    Patient Instructions  Medication Instructions:  STOP AMIODARONE   DECREASE YOUR CARVEDILOL TO 3.125 MG TWICE A DAY   START DOXAZOSIN 2 MG AT BEDTIME   *If you need a refill on your cardiac medications before your next appointment, please call your pharmacy*  Lab Work: FASTING LP/CMET/TSH   If you have labs (blood work) drawn today and your tests are completely normal, you will receive your results only by: MyChart Message (if you have MyChart) OR A paper copy in the mail If you have any lab test that is abnormal or we need to change your treatment, we will call you to review the results.  Testing/Procedures: NONE   Follow-Up: At [facility], you and your health needs  are our priority.  As part of our continuing mission to provide you with exceptional heart care, we have created designated Provider Care Teams.  These Care Teams include your primary Cardiologist (physician) and Advanced Practice Providers  (APPs -  Physician Assistants and Nurse Practitioners) who all work together to provide you with the care you need, when you need it.  We recommend signing up for the patient portal called "MyChart".  Sign up information is provided on this After Visit Summary.  MyChart is used to connect with patients for Virtual Visits (Telemedicine).  Patients are able to view lab/test results, encounter notes, upcoming appointments, etc.  Non-urgent messages can be sent to your provider as well.   To learn more about what you can do with MyChart, go to [email or web address].    Your next appointment:   6 month(s)  The format for your next appointment:   In Person  Provider:   AT THE [place] OFFICE  Your physician recommends that you schedule a follow-up appointment in:1 MONTH WITH PHARM D OR APP IN  OFFICE       I,Mathew Stumpf,acting as a scribe for Chilton Si, MD.,have documented all relevant documentation on the behalf of Chilton Si, MD,as directed by  Chilton Si, MD while in the presence of Chilton Si, MD.  I, Aletta Edmunds C. Duke Salvia, MD have reviewed all documentation for this visit.  The documentation of the exam, diagnosis, procedures, and orders on 09/04/2021 are all accurate and complete.   Signed, Chilton Si, MD  09/04/2021 9:51 AM    [facility]

## 2021-09-04 ENCOUNTER — Other Ambulatory Visit: Payer: Self-pay

## 2021-09-04 ENCOUNTER — Encounter (HOSPITAL_BASED_OUTPATIENT_CLINIC_OR_DEPARTMENT_OTHER): Payer: Self-pay | Admitting: Cardiovascular Disease

## 2021-09-04 ENCOUNTER — Ambulatory Visit (HOSPITAL_BASED_OUTPATIENT_CLINIC_OR_DEPARTMENT_OTHER): Payer: Medicare Other | Admitting: Cardiovascular Disease

## 2021-09-04 ENCOUNTER — Telehealth (HOSPITAL_BASED_OUTPATIENT_CLINIC_OR_DEPARTMENT_OTHER): Payer: Self-pay | Admitting: Cardiovascular Disease

## 2021-09-04 VITALS — BP 156/62 | HR 49 | Ht 73.0 in | Wt 210.5 lb

## 2021-09-04 DIAGNOSIS — Z951 Presence of aortocoronary bypass graft: Secondary | ICD-10-CM | POA: Diagnosis not present

## 2021-09-04 DIAGNOSIS — I10 Essential (primary) hypertension: Secondary | ICD-10-CM | POA: Diagnosis not present

## 2021-09-04 DIAGNOSIS — Z5181 Encounter for therapeutic drug level monitoring: Secondary | ICD-10-CM | POA: Diagnosis not present

## 2021-09-04 DIAGNOSIS — I480 Paroxysmal atrial fibrillation: Secondary | ICD-10-CM | POA: Diagnosis not present

## 2021-09-04 MED ORDER — DOXAZOSIN MESYLATE 2 MG PO TABS: 2.0000 mg | ORAL_TABLET | Freq: Every day | ORAL | 1 refills | Status: DC

## 2021-09-04 MED ORDER — CARVEDILOL 3.125 MG PO TABS: 3.1250 mg | ORAL_TABLET | Freq: Two times a day (BID) | ORAL | 3 refills | Status: DC

## 2021-09-04 NOTE — Assessment & Plan Note (Addendum)
He is doing well status post CABG.  No angina.  Check fasting lipids and CMP today.  LDL goal is less than 70.  Continue rosuvastatin and Zetia.  Continue Farxiga.

## 2021-09-04 NOTE — Assessment & Plan Note (Addendum)
No recent episodes.  Reducing carvedilol due to bradycardia.  He was started on amiodarone in the hospital for postoperative atrial fibrillation, though he had A. fib prior to admission.  He underwent left atrial appendage clipping.  We will stop the amiodarone due to concern for long-term side effects.  If he has recurrent atrial fibrillation, would prefer an alternate antiarrhythmic such as dofetilide.  Continue Eliquis.

## 2021-09-04 NOTE — Assessment & Plan Note (Signed)
Blood pressure was poorly controlled in the office.  At home he reports that his in the 130s.  It should be less than 130/80.  He is bradycardic on his EKG. he is asymptomatic, but this is too low.  We will reduce the carvedilol to 3.125 mg twice daily.  He also reports issues likely related to his prostate.  We will add doxazosin 2 mg nightly.  He will track his blood pressures and bring to follow-up with pharmacist.  Given his diabetes, ideally he should be on an ARB or ACE inhibitor.  However he reports medication fatigue so we will start with this.

## 2021-09-04 NOTE — Telephone Encounter (Signed)
Patient is requesting to transfer care to our [place] location---Dr. Duke Salvia is ok with the transfer

## 2021-09-04 NOTE — Patient Instructions (Signed)
Medication Instructions:  STOP AMIODARONE   DECREASE YOUR CARVEDILOL TO 3.125 MG TWICE A DAY   START DOXAZOSIN 2 MG AT BEDTIME   *If you need a refill on your cardiac medications before your next appointment, please call your pharmacy*  Lab Work: FASTING LP/CMET/TSH   If you have labs (blood work) drawn today and your tests are completely normal, you will receive your results only by: MyChart Message (if you have MyChart) OR A paper copy in the mail If you have any lab test that is abnormal or we need to change your treatment, we will call you to review the results.  Testing/Procedures: NONE   Follow-Up: At [facility], you and your health needs are our priority.  As part of our continuing mission to provide you with exceptional heart care, we have created designated Provider Care Teams.  These Care Teams include your primary Cardiologist (physician) and Advanced Practice Providers (APPs -  Physician Assistants and Nurse Practitioners) who all work together to provide you with the care you need, when you need it.  We recommend signing up for the patient portal called "MyChart".  Sign up information is provided on this After Visit Summary.  MyChart is used to connect with patients for Virtual Visits (Telemedicine).  Patients are able to view lab/test results, encounter notes, upcoming appointments, etc.  Non-urgent messages can be sent to your provider as well.   To learn more about what you can do with MyChart, go to [email or web address].    Your next appointment:   6 month(s)  The format for your next appointment:   In Person  Provider:   AT THE [place] OFFICE  Your physician recommends that you schedule a follow-up appointment in:1 MONTH WITH PHARM D OR APP IN [place] OFFICE

## 2021-09-04 NOTE — Telephone Encounter (Signed)
Spoke with patient regarding chang in March 2023 appointment----new appointment date and time-----Thursday 03/21/22 at 8:45 am with Dr. Rennis Golden.  Will  mail information to patient and he voiced his understanding.

## 2021-09-04 NOTE — Telephone Encounter (Signed)
Left message for patient to call and discuss rescheduling his 03/11/22 appointment in the [place] office

## 2021-09-05 LAB — LIPID PANEL
Chol/HDL Ratio: 2.6 ratio (ref 0.0–5.0)
Cholesterol, Total: 110 mg/dL (ref 100–199)
HDL: 42 mg/dL (ref >39)
LDL Chol Calc (NIH): 44 mg/dL (ref 0–99)
Triglycerides: 135 mg/dL (ref 0–149)
VLDL Cholesterol Cal: 24 mg/dL (ref 5–40)

## 2021-09-05 LAB — TSH: TSH: 2.53 u[IU]/mL (ref 0.450–4.500)

## 2021-09-05 LAB — COMPREHENSIVE METABOLIC PANEL
ALT: 8 IU/L (ref 0–44)
AST: 12 IU/L (ref 0–40)
Albumin/Globulin Ratio: 1.6 (ref 1.2–2.2)
Albumin: 4.6 g/dL (ref 3.7–4.7)
Alkaline Phosphatase: 56 IU/L (ref 44–121)
BUN/Creatinine Ratio: 12 (ref 10–24)
BUN: 13 mg/dL (ref 8–27)
Bilirubin Total: 0.2 mg/dL (ref 0.0–1.2)
CO2: 22 mmol/L (ref 20–29)
Calcium: 9.5 mg/dL (ref 8.6–10.2)
Chloride: 96 mmol/L (ref 96–106)
Creatinine, Ser: 1.1 mg/dL (ref 0.76–1.27)
Globulin, Total: 2.8 g/dL (ref 1.5–4.5)
Glucose: 168 mg/dL — ABNORMAL HIGH (ref 65–99)
Potassium: 5.6 mmol/L — ABNORMAL HIGH (ref 3.5–5.2)
Sodium: 136 mmol/L (ref 134–144)
Total Protein: 7.4 g/dL (ref 6.0–8.5)
eGFR: 70 mL/min/{1.73_m2} (ref >59)

## 2021-09-06 ENCOUNTER — Telehealth (HOSPITAL_BASED_OUTPATIENT_CLINIC_OR_DEPARTMENT_OTHER): Payer: Self-pay | Admitting: *Deleted

## 2021-09-06 DIAGNOSIS — I10 Essential (primary) hypertension: Secondary | ICD-10-CM

## 2021-09-06 DIAGNOSIS — E875 Hyperkalemia: Secondary | ICD-10-CM

## 2021-09-06 NOTE — Telephone Encounter (Signed)
-----   Message from Chilton Si, MD sent at 09/06/2021  5:19 PM EDT ----- His cholesterol is great.  Thyroid is normal.  Potassium is a little high.  Try to avoid foods high in potassium like bananas, spinach, broccoli, avocados, potatoes, dried fruits, and squash.  Repeat BMP in a week.

## 2021-09-06 NOTE — Telephone Encounter (Signed)
Advised patient of results, verbalized understanding. BMET orders placed

## 2021-09-20 ENCOUNTER — Other Ambulatory Visit (HOSPITAL_COMMUNITY)
Admission: RE | Admit: 2021-09-20 | Discharge: 2021-09-20 | Disposition: A | Discharge status: Home / Self Care | Payer: Medicare Other | Source: Ambulatory Visit | Attending: Cardiovascular Disease | Admitting: Cardiovascular Disease

## 2021-09-20 ENCOUNTER — Other Ambulatory Visit: Payer: Self-pay

## 2021-09-20 DIAGNOSIS — I10 Essential (primary) hypertension: Secondary | ICD-10-CM | POA: Diagnosis present

## 2021-09-20 DIAGNOSIS — E875 Hyperkalemia: Secondary | ICD-10-CM | POA: Diagnosis present

## 2021-09-20 LAB — BASIC METABOLIC PANEL
Anion gap: 11 (ref 5–15)
BUN: 21 mg/dL (ref 8–23)
CO2: 24 mmol/L (ref 22–32)
Calcium: 8.9 mg/dL (ref 8.9–10.3)
Chloride: 100 mmol/L (ref 98–111)
Creatinine, Ser: 1 mg/dL (ref 0.61–1.24)
GFR, Estimated: >60 mL/min (ref >60)
Glucose, Bld: 141 mg/dL — ABNORMAL HIGH (ref 70–99)
Potassium: 4.8 mmol/L (ref 3.5–5.1)
Sodium: 135 mmol/L (ref 135–145)

## 2021-10-13 ENCOUNTER — Other Ambulatory Visit: Payer: Self-pay | Admitting: Physician Assistant

## 2021-10-19 ENCOUNTER — Ambulatory Visit (HOSPITAL_BASED_OUTPATIENT_CLINIC_OR_DEPARTMENT_OTHER): Payer: Medicare Other | Admitting: Pharmacist Clinician (PhC)/ Clinical Pharmacy Specialist

## 2021-10-19 ENCOUNTER — Encounter (HOSPITAL_BASED_OUTPATIENT_CLINIC_OR_DEPARTMENT_OTHER): Payer: Self-pay | Admitting: Pharmacist Clinician (PhC)/ Clinical Pharmacy Specialist

## 2021-10-19 ENCOUNTER — Other Ambulatory Visit: Payer: Self-pay

## 2021-10-19 DIAGNOSIS — I10 Essential (primary) hypertension: Secondary | ICD-10-CM | POA: Diagnosis not present

## 2021-10-19 MED ORDER — DOXAZOSIN MESYLATE 4 MG PO TABS: 4.0000 mg | ORAL_TABLET | Freq: Every day | ORAL | 3 refills | Status: DC

## 2021-10-19 NOTE — Patient Instructions (Addendum)
Return for a a follow up appointment January 13 at 10:30 am - [place] office   Check your blood pressure at home no more than twice daily and keep record of the readings.  Take your BP meds as follows:  Increase doxazosin to 4 mg each night.  Bring all of your meds, your BP cuff and your record of home blood pressures to your next appointment.  Exercise as you're able, try to walk approximately 30 minutes per day.  Keep salt intake to a minimum, especially watch canned and prepared boxed foods.  Eat more fresh fruits and vegetables and fewer canned items.  Avoid eating in fast food restaurants.    HOW TO TAKE YOUR BLOOD PRESSURE: Rest 5 minutes before taking your blood pressure.  Don't smoke or drink caffeinated beverages for at least 30 minutes before. Take your blood pressure before (not after) you eat. Sit comfortably with your back supported and both feet on the floor (don't cross your legs). Elevate your arm to heart level on a table or a desk. Use the proper sized cuff. It should fit smoothly and snugly around your bare upper arm. There should be enough room to slip a fingertip under the cuff. The bottom edge of the cuff should be 1 inch above the crease of the elbow. Ideally, take 3 measurements at one sitting and record the average.

## 2021-10-19 NOTE — Progress Notes (Signed)
10/19/2021 Herbert Torres 1946/10/01 [id number]   HPI:  Herbert Torres is a 75 y.o. male patient of Dr Herbert Torres, with a PMH below who presents today for advanced hypertension clinic follow up.  He was seen by Dr. Duke Torres last month, at which time his pressure was noted to be 156/62.  Patient noted that home BP readings were much better, averaging in the 130's systolic.  At that visit, his pulse was 49, and carvedilol was decreased from 6.25 to 3.125 mg twice daily.  Because of complaints of urinary frequency, he was also given doxazosin 2 mg nightly.  He should also be on an ACEI/ARB due to both DM and prior MI, however patient was reporting medication fatigue, so it was held off.    Today he returns for follow up, with his daughter.  He doesn't have any home blood pressure readings with him, but notes that they are mostly "fine" at home.  No side effects reported with starting the doxazosin, and states compliance with medications.  Daughter notes that she puts his medications in weekly pill minders for him and they are appropriately empty each week.    Past Medical History: CAD 10/21 NSTEMI, CABG x 4,   DM2 10/21 A1c 6.7, now on dapagliflozin and metformin  hyperlipidemia 9/22 LDL 44, on rosuvastatin, ezetimibe  AF Noted post CABG, left atrial clipping done at CABG, CHADS2-VASc score 5, on Eliquis 5  GERD Omeprazole 20 mg     Blood Pressure Goal:  130/80  Current Medications: carvedilol 3.125 mg bid, doxazosin 2 mg qhs,   Family Hx: 9 siblings, all with hypertension, several with MI/death, father died from heart issues at 70, mother with hypertension, but no heart disease, died at 44; 2 children, 35, 41, neither with issues at this time  Social Hx: former smoker, quit 20+ years ago, no alcohol, coffee (whole pot each morning) , occasional tea  Diet: neighbor ladies bring meals to him several times each week, does not add salt, gets vegetables regulary  Exercise: 1.5 miles/day,  until knee issues recently, hoping to restart soon  Home BP readings: no readings today; per recall 150 highest. Lowest 128, diastolic all WNL  Intolerances: atorvastatin - myalgia (knees)  Labs: 9/22 - Na 135, K 4.8, Glu 141, BN 21, SCr 1. GFR > 60   Wt Readings from Last 3 Encounters:  10/19/21 208 lb 6.4 oz (94.5 kg)  09/04/21 210 lb 8 oz (95.5 kg)  03/16/21 213 lb 10 oz (96.9 kg)   BP Readings from Last 3 Encounters:  10/19/21 (!) 142/60  09/04/21 (!) 156/62  02/15/21 136/72   Pulse Readings from Last 3 Encounters:  10/19/21 62  09/04/21 (!) 49  02/15/21 70    Current Outpatient Medications  Medication Sig Dispense Refill   doxazosin (CARDURA) 4 MG tablet Take 1 tablet (4 mg total) by mouth daily. 90 tablet 3   apixaban (ELIQUIS) 5 MG TABS tablet Take 1 tablet (5 mg total) by mouth in the morning and at bedtime. 28 tablet 0   aspirin EC 81 MG EC tablet Take 1 tablet (81 mg total) by mouth daily. Swallow whole. 30 tablet 11   carvedilol (COREG) 3.125 MG tablet Take 1 tablet (3.125 mg total) by mouth 2 (two) times daily with a meal. 180 tablet 3   dapagliflozin propanediol (FARXIGA) 10 MG TABS tablet Take 1 tablet (10 mg total) by mouth daily before breakfast. 30 tablet 3   digoxin (LANOXIN) 0.25  MG tablet TAKE 1 TABLET BY MOUTH EVERY DAY 90 tablet 0   ezetimibe (ZETIA) 10 MG tablet TAKE 1 TABLET BY MOUTH EVERY DAY 90 tablet 2   metFORMIN (GLUCOPHAGE) 1000 MG tablet Take 1,000 mg by mouth 2 (two) times daily.  4   omeprazole (PRILOSEC) 20 MG capsule Take 20 mg by mouth daily.  3   rosuvastatin (CRESTOR) 20 MG tablet Take 1 tablet (20 mg total) by mouth daily. 90 tablet 3   No current facility-administered medications for this visit.    Allergies  Allergen Reactions   Atorvastatin Other (See Comments)    C/o myalgias in knees    Past Medical History:  Diagnosis Date   Coronary artery disease    Diabetes mellitus without complication (HCC)    GERD (gastroesophageal  reflux disease)    HOH (hard of hearing)    AIDS   Hyperlipidemia    Hypertension     Blood pressure (!) 142/60, pulse 62, height 6' (1.829 m), weight 208 lb 6.4 oz (94.5 kg).  Essential hypertension Patient with essential hypertension, still not at BP goal on carvedilol and doxazosin.  He has not noticed much improvement in urinary frequency since starting the doxazosin.  He is also hesitant to start any further medications.  Will have him increase the doxazosin to 4 mg nightly for now and stressed the need to keep a BP log over the next few months.  Will see him back in 2 months, and if no improvement will consider adding low dose ACE1/ARB or CCB for better BP control.     Herbert Torres PharmD CPP [facility] [address] [place], [place] [address] [phone]

## 2021-10-19 NOTE — Assessment & Plan Note (Signed)
Patient with essential hypertension, still not at BP goal on carvedilol and doxazosin.  He has not noticed much improvement in urinary frequency since starting the doxazosin.  He is also hesitant to start any further medications.  Will have him increase the doxazosin to 4 mg nightly for now and stressed the need to keep a BP log over the next few months.  Will see him back in 2 months, and if no improvement will consider adding low dose ACE1/ARB or CCB for better BP control.

## 2021-10-28 ENCOUNTER — Other Ambulatory Visit: Payer: Self-pay | Admitting: Cardiovascular Disease

## 2022-01-04 ENCOUNTER — Ambulatory Visit: Payer: Medicare Other

## 2022-01-06 IMAGING — DX DG CHEST 1V PORT
1 series · 1 of 1 positions shown · non-contrast
Comparison: October 18, 2020

CLINICAL DATA: Status postextubation. Recent coronary artery bypass
grafting

EXAM:
PORTABLE CHEST 1 VIEW

[chest]
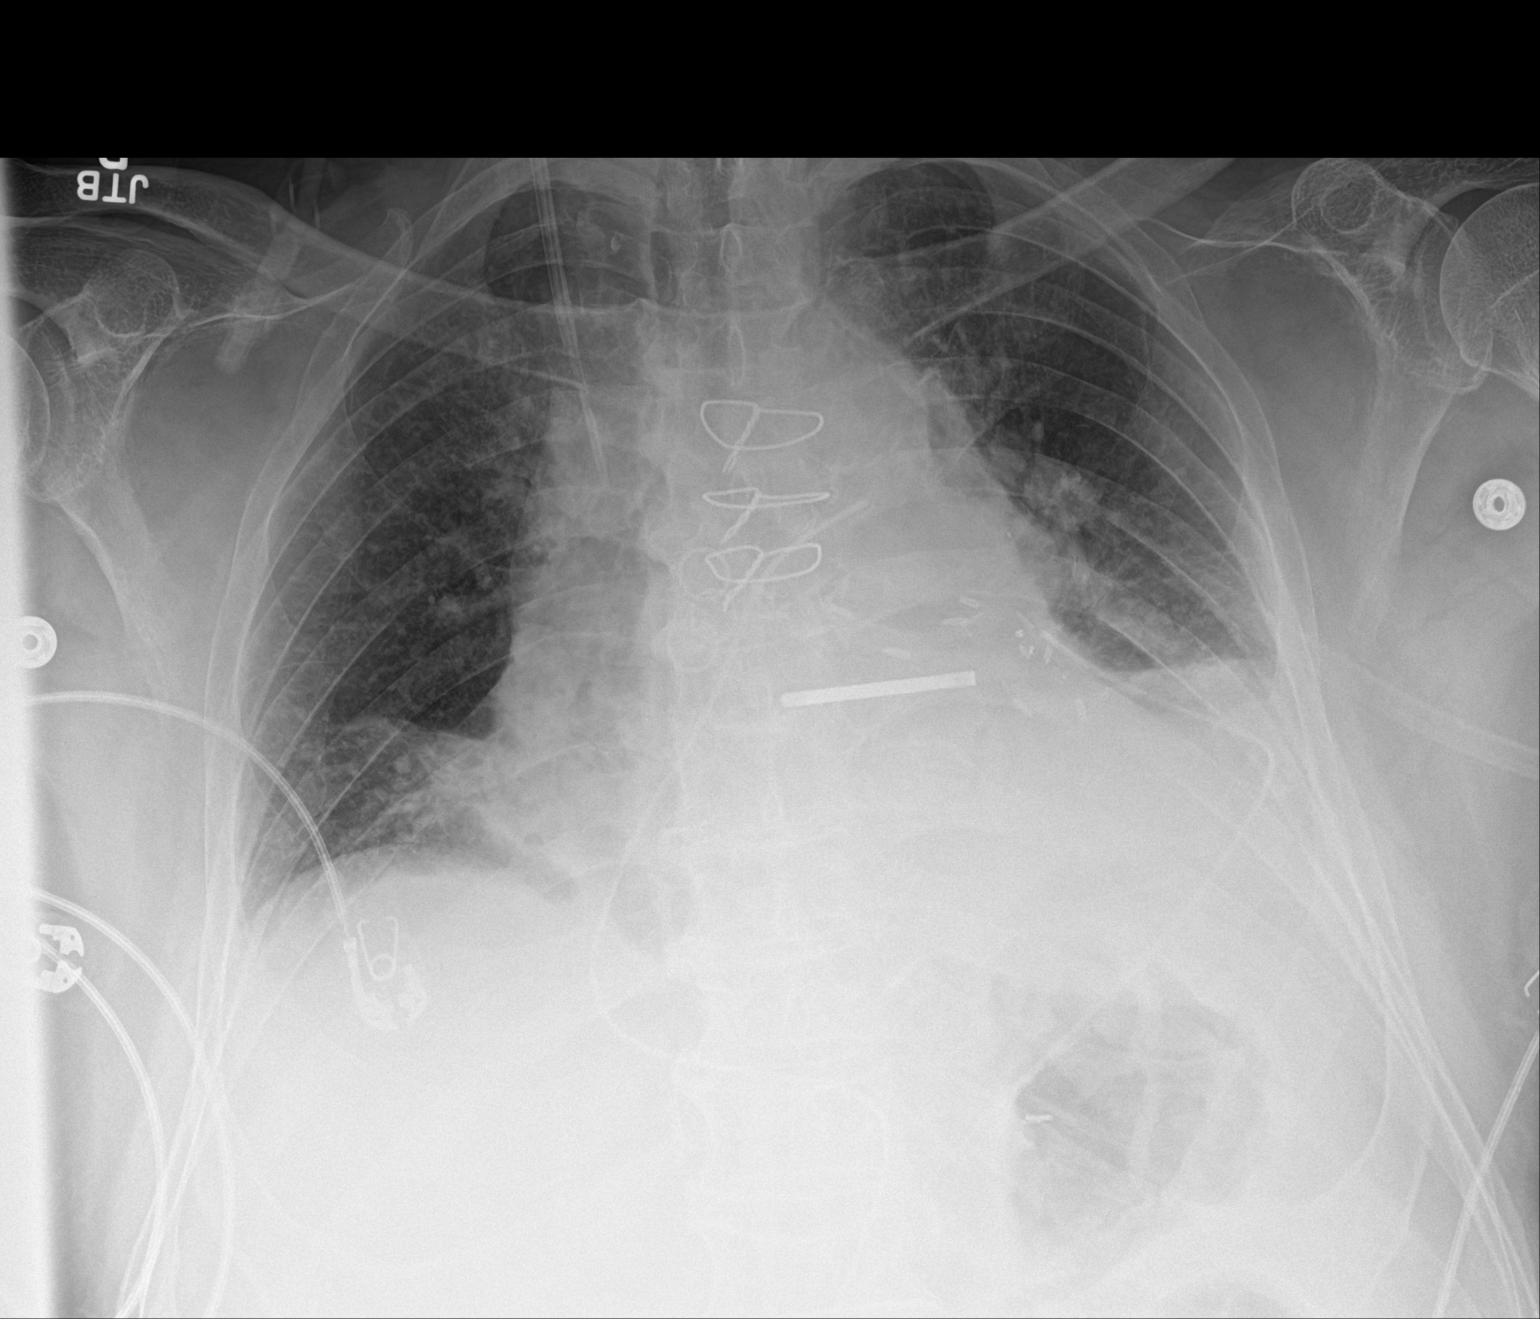

[1 of 1 positions shown; findings below may reference images not displayed]

FINDINGS: Endotracheal tube and nasogastric tube have been removed. Central
catheter tip is in the superior vena cava. Left chest tube and
mediastinal drain are unchanged in position. No pneumothorax. There
is a persistent left pleural effusion. There is atelectatic change
with patchy consolidation in each lung base. No new opacity evident.
There is cardiomegaly with pulmonary vascularity normal. Patient is
status post coronary artery bypass grafting with left atrial
appendage clamp present. No adenopathy. No bone lesions.
IMPRESSION: Tube and catheter positions as described without pneumothorax. Small
left pleural effusion with areas of atelectasis and patchy
consolidation in the lung bases. A degree of pneumonia in the lung
bases cannot be excluded, although the opacity in the bases may
entirely be due to atelectatic change. No new opacity evident.
Stable cardiomegaly and postoperative changes.

## 2022-01-07 IMAGING — DX DG CHEST 1V PORT
1 series · 1 of 1 positions shown · non-contrast
Comparison: 10/19/2020.

CLINICAL DATA: Fall post heart surgery.

EXAM:
PORTABLE CHEST 1 VIEW

[chest ap]
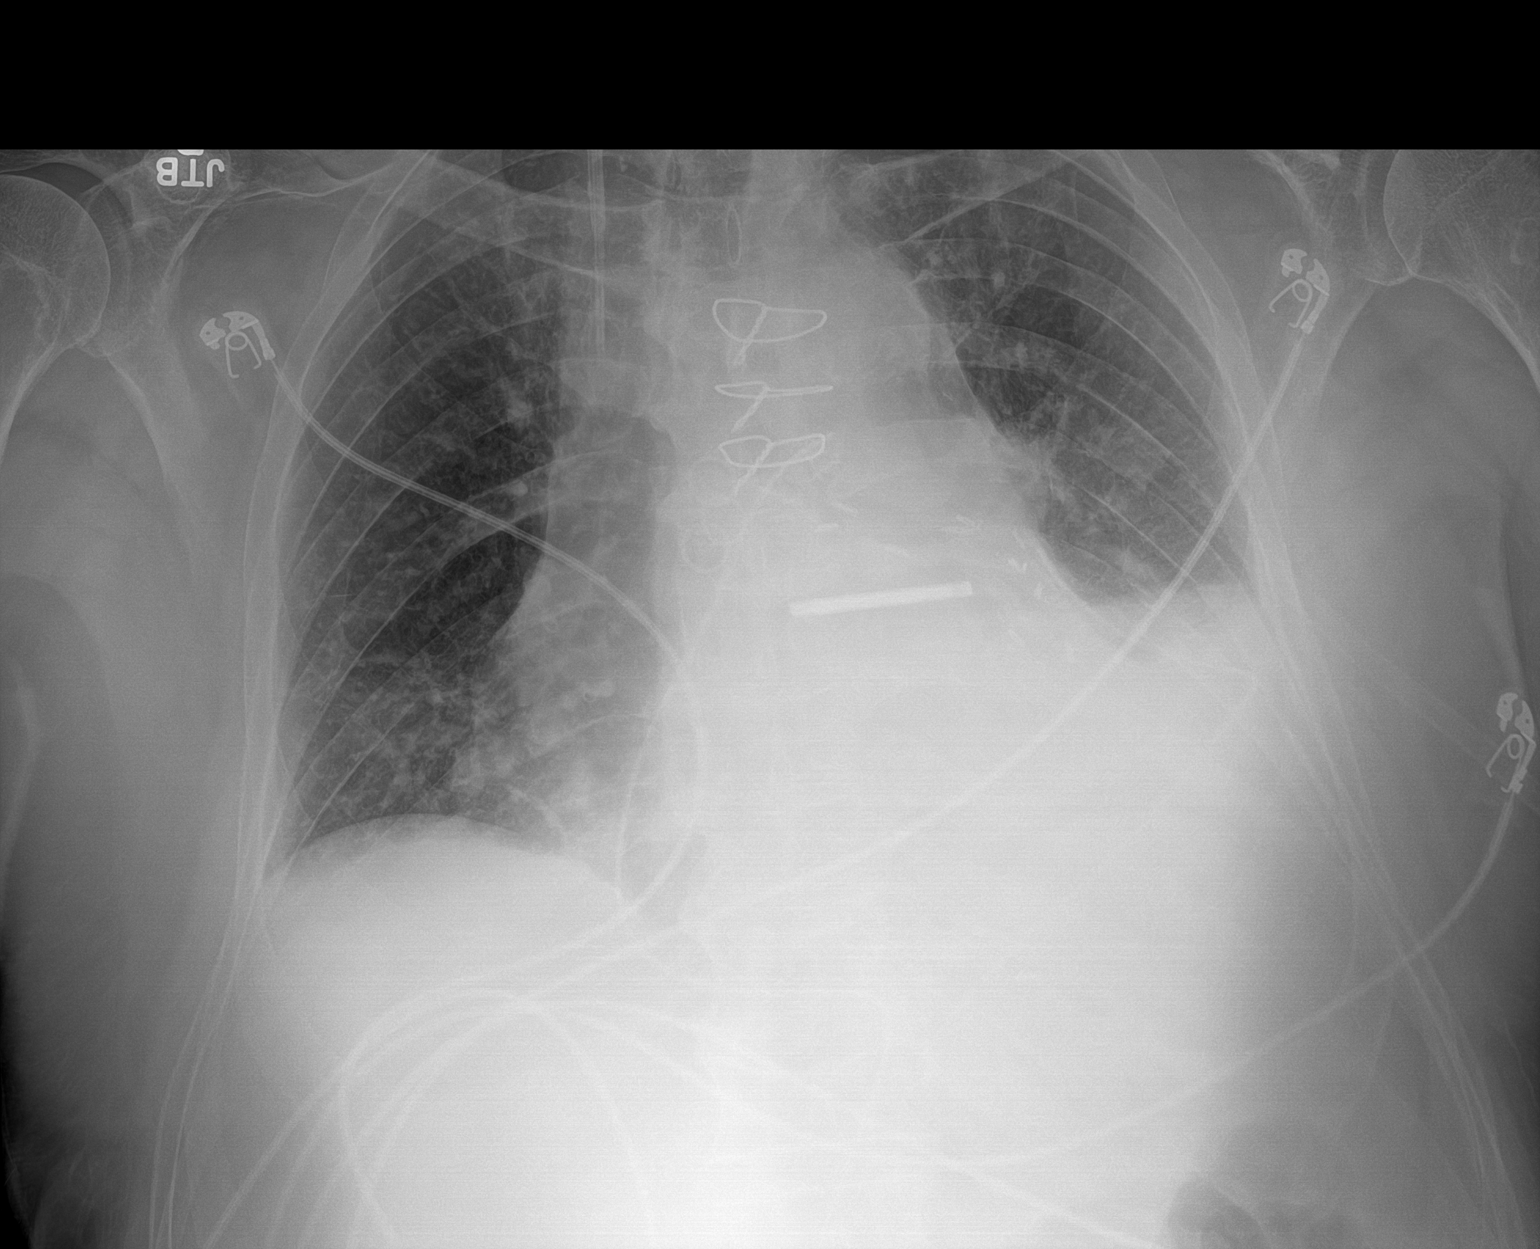

[1 of 1 positions shown; findings below may reference images not displayed]

FINDINGS: Right IJ line stable position. Mediastinal drainage tube and left
chest tube in stable position. Prior CABG. Stable cardiomegaly.
Persistent bibasilar atelectasis. Right base atelectasis has
improved from prior exam. Left base atelectasis remains prominent.
Small left pleural effusion. No pneumothorax.
IMPRESSION: 1. Right IJ line stable position. Mediastinal drainage tube and left
chest tube in stable position. No pneumothorax.
2. Persistent bibasilar atelectasis. Right base atelectasis has
improved from prior exam. Persistent prominent left base
atelectasis. Small left pleural effusion.

## 2022-01-10 IMAGING — DX DG CHEST 1V
1 series · 1 of 1 positions shown · non-contrast
Comparison: Earlier today

CLINICAL DATA: Thoracentesis on the left

EXAM:
CHEST  1 VIEW

[chest ap]
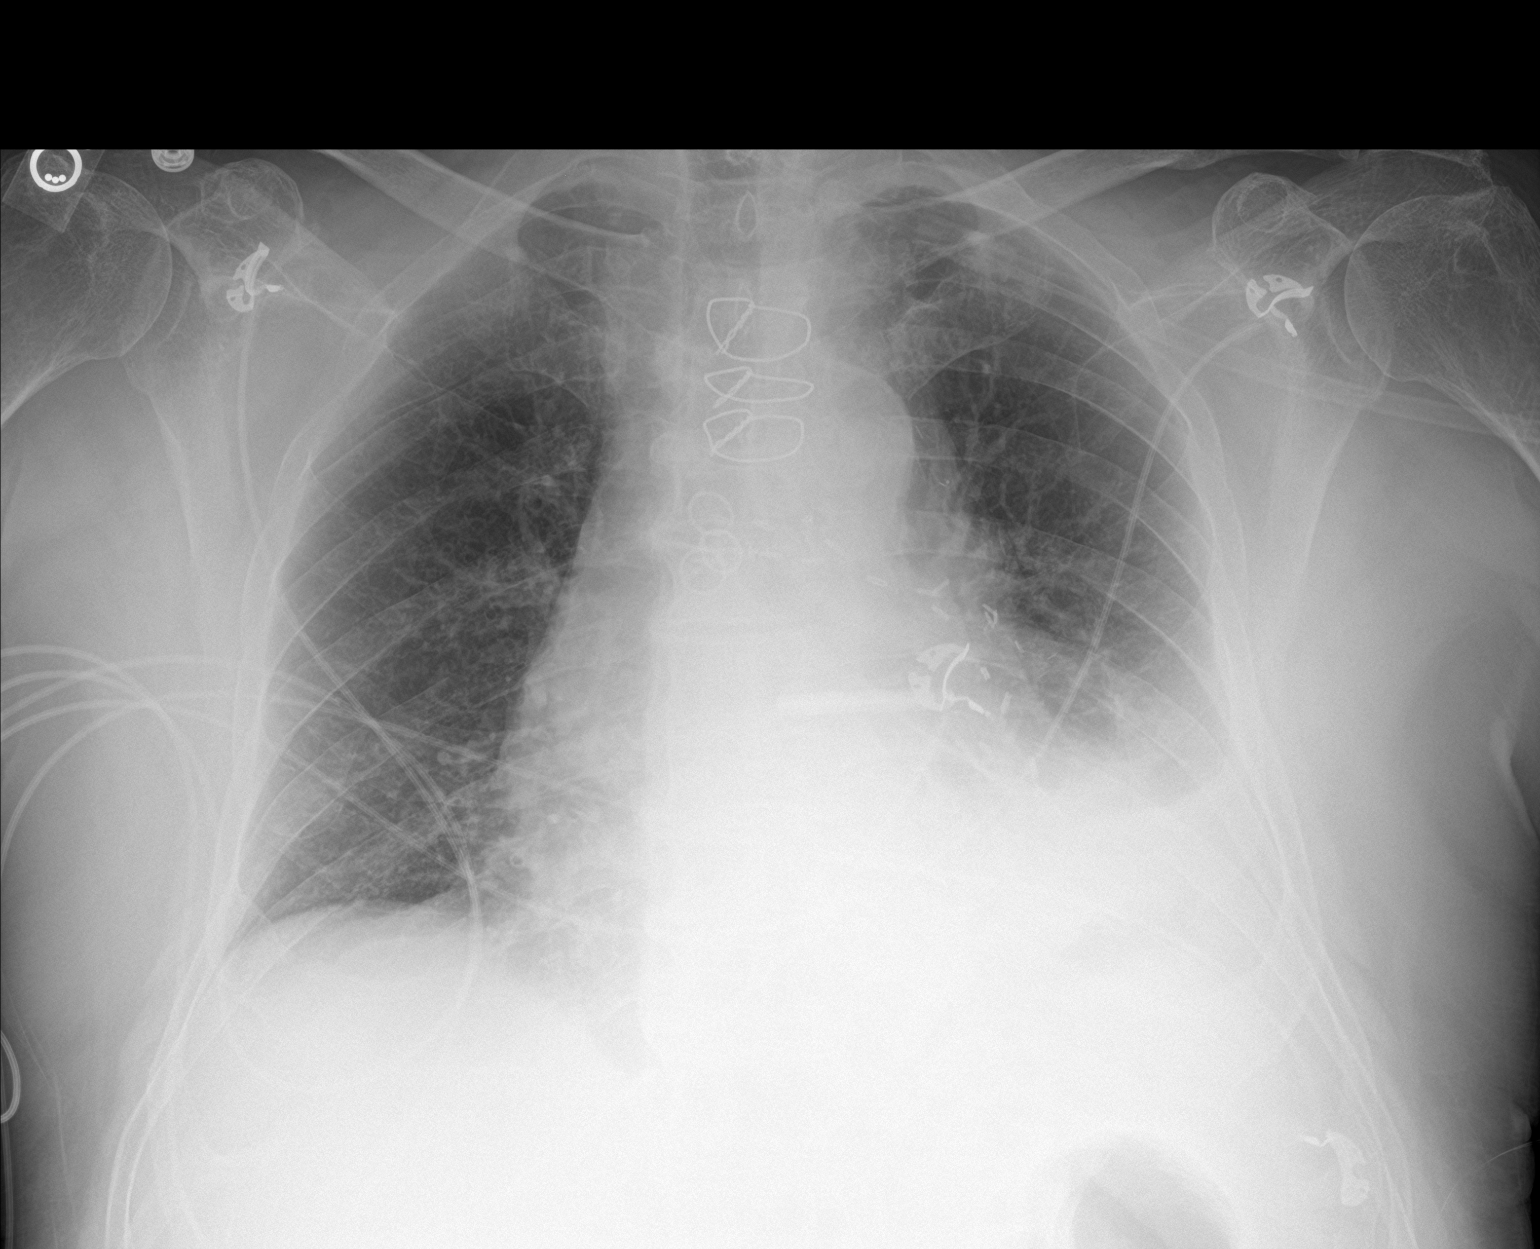

[1 of 1 positions shown; findings below may reference images not displayed]

FINDINGS: No pneumothorax or visible re-expansion edema. Tubing traverses the
left apex. The left pleural effusion has diminished but not
resolved. The right chest is relatively clear. Cardiopericardial
enlargement. CABG and left atrial clipping.
IMPRESSION: No complicating feature of thoracentesis.

## 2022-02-01 ENCOUNTER — Ambulatory Visit: Payer: Medicare Other

## 2022-02-18 IMAGING — DX DG CHEST 2V
2 series · 2 of 2 positions shown · non-contrast
Comparison: October 24, 2020.

CLINICAL DATA: Status post CABG x4

EXAM:
CHEST - 2 VIEW

[dg chest 2 view (1 of 2)]
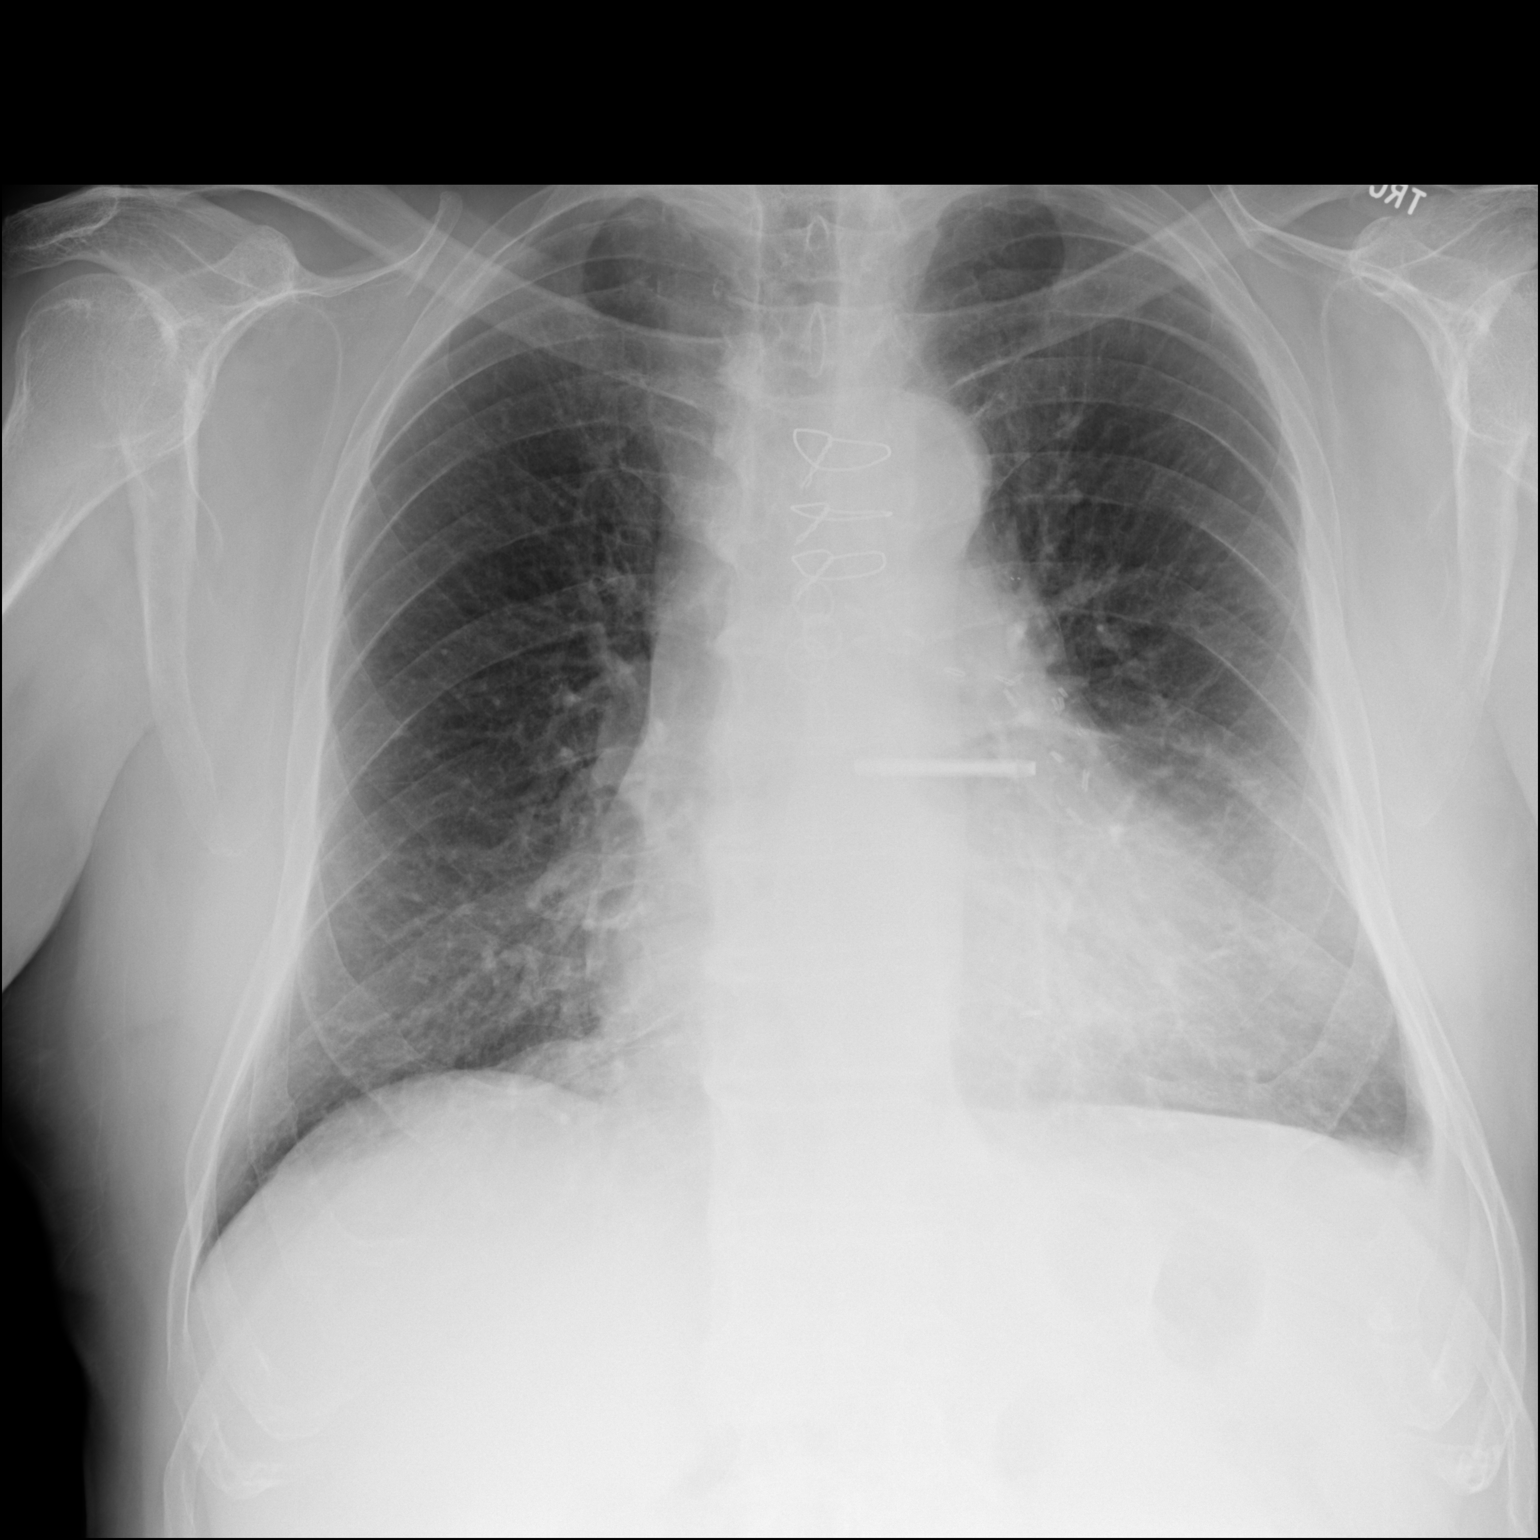

[dg chest 2 view (2 of 2)]
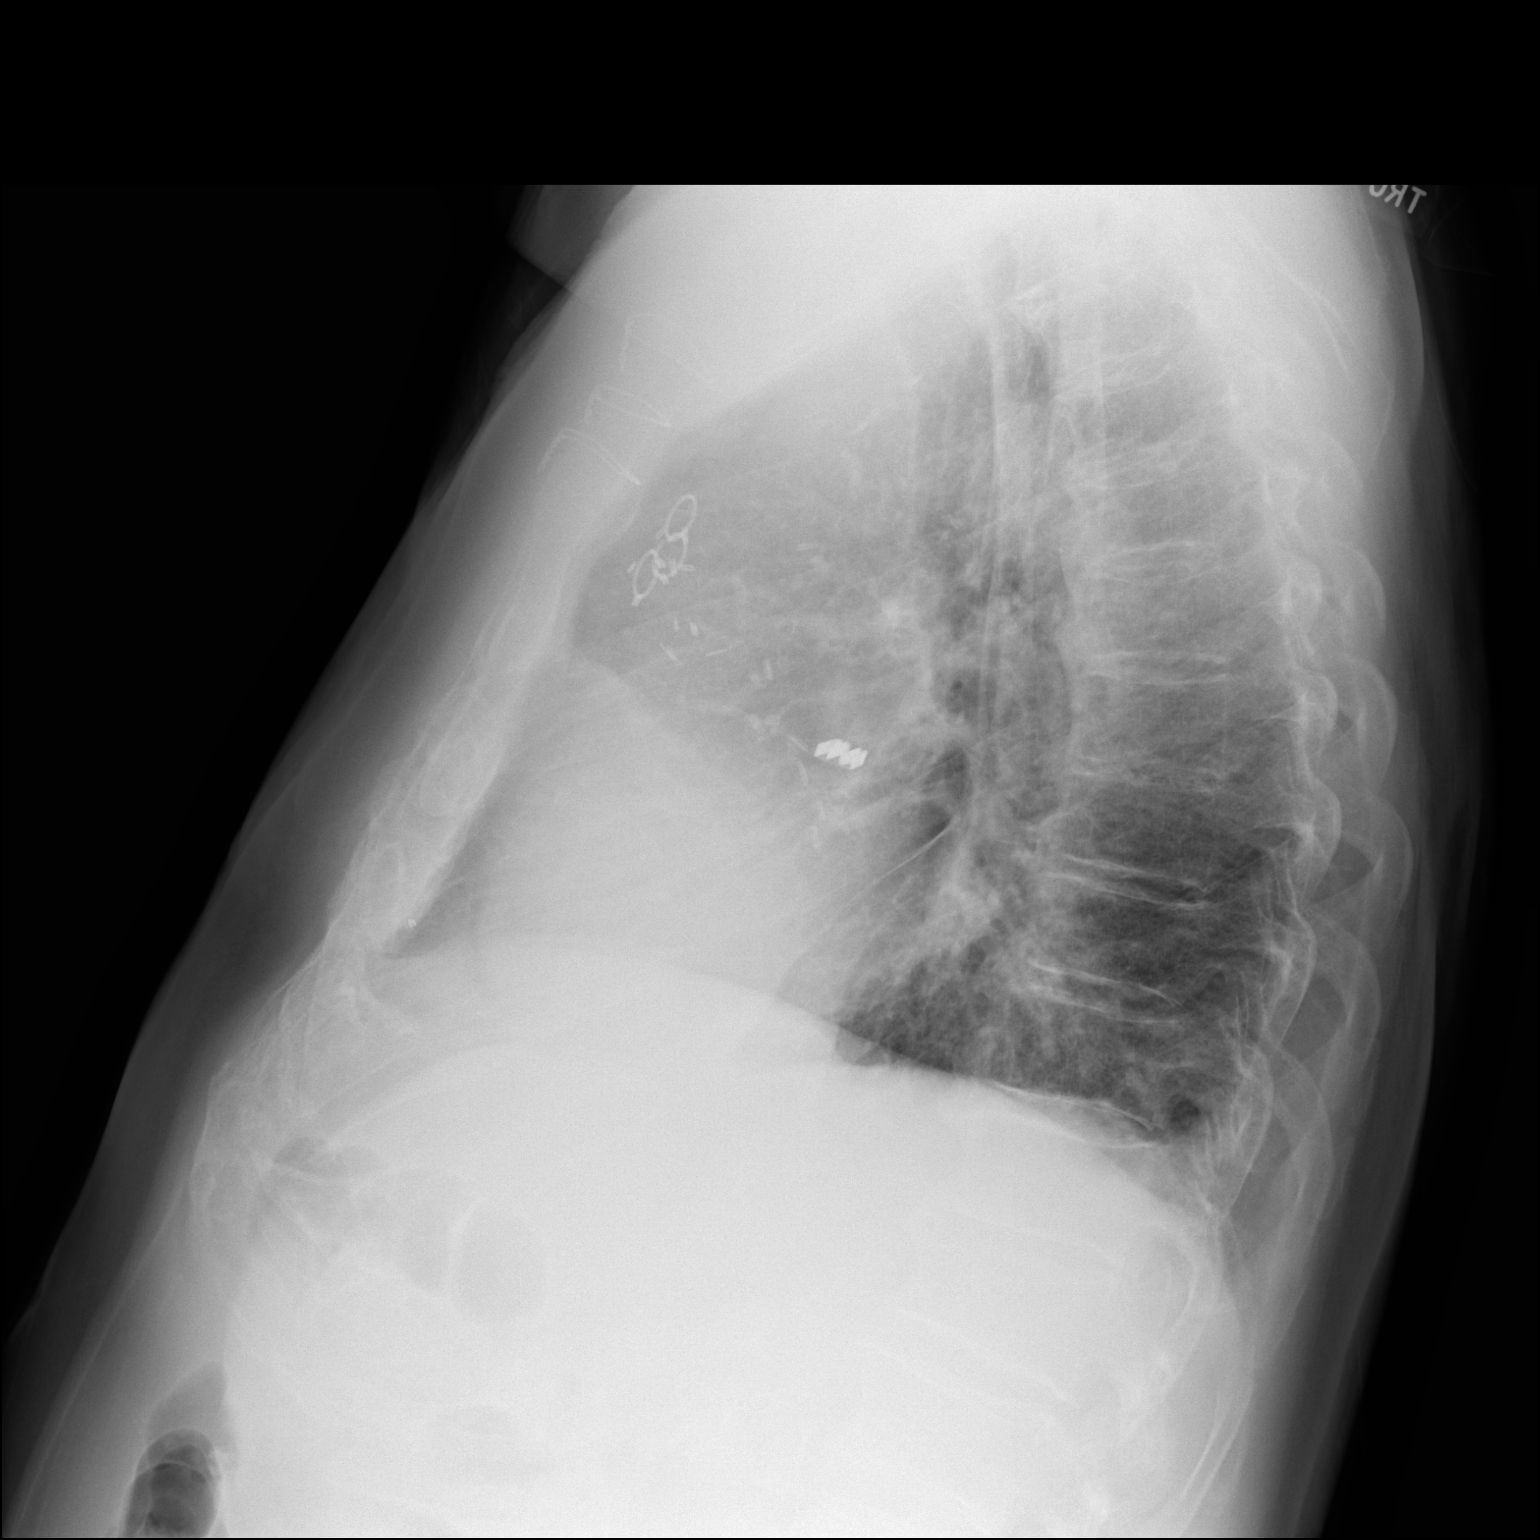

[2 of 2 positions shown; findings below may reference images not displayed]

FINDINGS: Enlarged cardiac silhouette. CABG with median sternotomy. Small left
pleural effusion. No focal consolidation. Small layering left
pleural effusion. No acute osseous abnormality.
IMPRESSION: 1. Small left pleural effusion. Otherwise, no acute cardiopulmonary
disease.
2. Cardiomegaly.

## 2022-03-11 ENCOUNTER — Ambulatory Visit: Payer: Medicare Other | Admitting: Cardiology

## 2022-03-21 ENCOUNTER — Other Ambulatory Visit (INDEPENDENT_AMBULATORY_CARE_PROVIDER_SITE_OTHER): Payer: Medicare Other

## 2022-03-21 ENCOUNTER — Other Ambulatory Visit (HOSPITAL_COMMUNITY)
Admission: RE | Admit: 2022-03-21 | Discharge: 2022-03-21 | Disposition: A | Discharge status: Home / Self Care | Payer: Medicare Other | Source: Ambulatory Visit | Attending: Cardiology | Admitting: Cardiology

## 2022-03-21 ENCOUNTER — Ambulatory Visit: Payer: Medicare Other | Admitting: Internal Medicine

## 2022-03-21 ENCOUNTER — Encounter: Payer: Self-pay | Admitting: *Deleted

## 2022-03-21 ENCOUNTER — Telehealth: Payer: Self-pay | Admitting: Cardiology

## 2022-03-21 ENCOUNTER — Ambulatory Visit: Payer: Medicare Other | Admitting: Cardiology

## 2022-03-21 ENCOUNTER — Encounter: Payer: Self-pay | Admitting: Cardiology

## 2022-03-21 VITALS — BP 118/58 | HR 80 | Ht 72.0 in | Wt 207.0 lb

## 2022-03-21 DIAGNOSIS — E7800 Pure hypercholesterolemia, unspecified: Secondary | ICD-10-CM

## 2022-03-21 DIAGNOSIS — Z79899 Other long term (current) drug therapy: Secondary | ICD-10-CM | POA: Diagnosis present

## 2022-03-21 DIAGNOSIS — I10 Essential (primary) hypertension: Secondary | ICD-10-CM

## 2022-03-21 DIAGNOSIS — I2510 Atherosclerotic heart disease of native coronary artery without angina pectoris: Secondary | ICD-10-CM | POA: Diagnosis not present

## 2022-03-21 DIAGNOSIS — I480 Paroxysmal atrial fibrillation: Secondary | ICD-10-CM | POA: Diagnosis not present

## 2022-03-21 DIAGNOSIS — I2583 Coronary atherosclerosis due to lipid rich plaque: Secondary | ICD-10-CM

## 2022-03-21 LAB — COMPREHENSIVE METABOLIC PANEL
ALT: 9 U/L (ref 0–44)
AST: 13 U/L — ABNORMAL LOW (ref 15–41)
Albumin: 4.4 g/dL (ref 3.5–5.0)
Alkaline Phosphatase: 43 U/L (ref 38–126)
Anion gap: 13 (ref 5–15)
BUN: 17 mg/dL (ref 8–23)
CO2: 22 mmol/L (ref 22–32)
Calcium: 8.8 mg/dL — ABNORMAL LOW (ref 8.9–10.3)
Chloride: 99 mmol/L (ref 98–111)
Creatinine, Ser: 1.11 mg/dL (ref 0.61–1.24)
GFR, Estimated: >60 mL/min (ref >60)
Glucose, Bld: 151 mg/dL — ABNORMAL HIGH (ref 70–99)
Potassium: 4.3 mmol/L (ref 3.5–5.1)
Sodium: 134 mmol/L — ABNORMAL LOW (ref 135–145)
Total Bilirubin: 0.5 mg/dL (ref 0.3–1.2)
Total Protein: 7.9 g/dL (ref 6.5–8.1)

## 2022-03-21 LAB — CBC
HCT: 39.5 % (ref 39.0–52.0)
Hemoglobin: 12.1 g/dL — ABNORMAL LOW (ref 13.0–17.0)
MCH: 26.8 pg (ref 26.0–34.0)
MCHC: 30.6 g/dL (ref 30.0–36.0)
MCV: 87.4 fL (ref 80.0–100.0)
Platelets: 276 10*3/uL (ref 150–400)
RBC: 4.52 MIL/uL (ref 4.22–5.81)
RDW: 13.8 % (ref 11.5–15.5)
WBC: 7.7 10*3/uL (ref 4.0–10.5)
nRBC: 0 % (ref 0.0–0.2)

## 2022-03-21 LAB — LIPID PANEL
Cholesterol: 104 mg/dL (ref 0–200)
HDL: 37 mg/dL — ABNORMAL LOW (ref >40)
LDL Cholesterol: 35 mg/dL (ref 0–99)
Total CHOL/HDL Ratio: 2.8 RATIO
Triglycerides: 161 mg/dL — ABNORMAL HIGH (ref <150)
VLDL: 32 mg/dL (ref 0–40)

## 2022-03-21 LAB — DIGOXIN LEVEL: Digoxin Level: 1.6 ng/mL (ref 0.8–2.0)

## 2022-03-21 MED ORDER — METOPROLOL SUCCINATE ER 25 MG PO TB24: 25.0000 mg | ORAL_TABLET | Freq: Every day | ORAL | 3 refills | Status: DC

## 2022-03-21 NOTE — Patient Instructions (Signed)
Medication Instructions:  ? ?Stop Coreg  ?Start Toprol XL 25 mg Daily  ? ?*If you need a refill on your cardiac medications before your next appointment, please call your pharmacy* ? ? ?Lab Work: ?Your physician recommends that you return for lab work in: Fasting  ? ?If you have labs (blood work) drawn today and your tests are completely normal, you will receive your results only by: ?MyChart Message (if you have MyChart) OR ?A paper copy in the mail ?If you have any lab test that is abnormal or we need to change your treatment, we will call you to review the results. ? ? ?Testing/Procedures: ?Your physician has requested that you have an echocardiogram. Echocardiography is a painless test that uses sound waves to create images of your heart. It provides your doctor with information about the size and shape of your heart and how well your heart?s chambers and valves are working. This procedure takes approximately one hour. There are no restrictions for this procedure. ? ?Your physician has recommended that you have a Cardioversion (DCCV). Electrical Cardioversion uses a jolt of electricity to your heart either through paddles or wired patches attached to your chest. This is a controlled, usually prescheduled, procedure. Defibrillation is done under light anesthesia in the hospital, and you usually go home the day of the procedure. This is done to get your heart back into a normal rhythm. You are not awake for the procedure. Please see the instruction sheet given to you today. ? ? ? ?Follow-Up: ?At [facility], you and your health needs are our priority.  As part of our continuing mission to provide you with exceptional heart care, we have created designated Provider Care Teams.  These Care Teams include your primary Cardiologist (physician) and Advanced Practice Providers (APPs -  Physician Assistants and Nurse Practitioners) who all work together to provide you with the care you need, when you need it. ? ?We  recommend signing up for the patient portal called "MyChart".  Sign up information is provided on this After Visit Summary.  MyChart is used to connect with patients for Virtual Visits (Telemedicine).  Patients are able to view lab/test results, encounter notes, upcoming appointments, etc.  Non-urgent messages can be sent to your provider as well.   ?To learn more about what you can do with MyChart, go to [email or web address].   ? ?Your next appointment:   ?1 -2 week(s) ? ?The format for your next appointment:   ?In Person ? ?Provider:   ?You may see Armanda Magic, MD  or one of the following Advanced Practice Providers on your designated Care Team:   ?Randall An, PA-C  ?Jacolyn Reedy, PA-C   ? ? ?Other Instructions ?Thank you for choosing [facility]! ?  ? ?

## 2022-03-21 NOTE — Telephone Encounter (Signed)
Spoke with daughter,copied pcp ?

## 2022-03-21 NOTE — Progress Notes (Addendum)
?Cardiology Office Note:   ? ?Date:  03/21/2022  ? ?ID:  Herbert Torres, DOB Nov 15, 1946, MRN [id number] ? ?PCP:  [facility] ?  ?[facility] Providers ?Cardiologist:  Chilton Si, MD    ? ?Referring MD: [facility]*  ? ?Chief Complaint  ?Patient presents with  ? Coronary Artery Disease  ? Hypertension  ? Atrial Fibrillation  ? Hyperlipidemia  ? ? ? ?History of Present Illness:   ? ?Herbert Torres is a 76 y.o. male with a hx of coronary artery disease, diabetes mellitus, GERD, hyperlipidemia, and hypertension.  He also has a history of CAD status post NSTEMI in 2021. Cath revealed multi-vessel CAD. He underwent CABG x4 and left atrial clipping on 10/18/20. His hospitalization was complicated by post-op atrial fibrillation. He converted to sinus rhythm on amiodarone.  He went back into atrial fibrillation and had a cardioversion 10/28/20.  ? ?He is here today for followup.  He tells me that he has recently been having palpitations during the day and at night which wake him up.  He also has been having problems with dizziness throughout the day.  He describes it as a swimmy headed.  He notices it when he gets up in the am.  He thinks it is vertigo and feels like he is off balance.  He has had some allergies problems and allergic rhinitis.  This has been going on for about a month.  He tells me that he had vague symptoms when he had his MI and had no CP or SOB.  He recently started having DOE and has had to stop walking around the track due to significant SOB.  He denies any anginal chest pain or pressure, PND, orthopnea, LE edema or syncope. He is compliant with his meds and is tolerating meds with no SE.    ? ? ?Past Medical History:  ?Diagnosis Date  ? Coronary artery disease   ? Diabetes mellitus without complication (HCC)   ? GERD (gastroesophageal reflux disease)   ? HOH (hard of hearing)   ? AIDS  ? Hyperlipidemia   ? Hypertension   ? ? ?Past Surgical History:  ?Procedure  Laterality Date  ? APPENDECTOMY    ? CARDIOVERSION N/A 10/27/2020  ? Procedure: CARDIOVERSION;  Surgeon: Lewayne Bunting, MD;  Location: [facility] ENDOSCOPY;  Service: Cardiovascular;  Laterality: N/A;  ? CATARACT EXTRACTION W/PHACO Right 02/26/2018  ? Procedure: CATARACT EXTRACTION PHACO AND INTRAOCULAR LENS PLACEMENT (IOC);  Surgeon: Nevada Crane, MD;  Location: [facility] ORS;  Service: Ophthalmology;  Laterality: Right;  fluid pak loT# [id number] H  exp10/31/2020 ?[place]   00:38.9 ?AP%   16.3 ?CDE   6.33  ? CLIPPING OF ATRIAL APPENDAGE Left 10/18/2020  ? Procedure: CLIPPING OF LEFT ATRIAL APPENDAGE USING ATRICURE ATRICLIP SIZE 40;  Surgeon: Corliss Skains, MD;  Location: [facility] OR;  Service: Open Heart Surgery;  Laterality: Left;  ? COLONOSCOPY    ? CORONARY ARTERY BYPASS GRAFT N/A 10/18/2020  ? Procedure: CORONARY ARTERY BYPASS GRAFTING (CABG) TIMES FOUR USING LEFT INTERNAL MAMMARY ARTERY AND RIGHT LEG GREATER SAPHENOUS VEIN HARVESTED ENDOSCOPICALLY Flowtrack only;  Surgeon: Corliss Skains, MD;  Location: [facility] OR;  Service: Open Heart Surgery;  Laterality: N/A;  ? IR THORACENTESIS ASP PLEURAL SPACE W/IMG GUIDE  10/23/2020  ? LEFT HEART CATH AND CORONARY ANGIOGRAPHY N/A 10/16/2020  ? Procedure: LEFT HEART CATH AND CORONARY ANGIOGRAPHY;  Surgeon: Marykay Lex, MD;  Location: [facility] INVASIVE CV LAB;  Service: Cardiovascular;  Laterality: N/A;  ? TEE WITHOUT CARDIOVERSION N/A 10/18/2020  ? Procedure: TRANSESOPHAGEAL ECHOCARDIOGRAM (TEE);  Surgeon: Corliss Skains, MD;  Location: [facility] OR;  Service: Open Heart Surgery;  Laterality: N/A;  ? ? ?Current Medications: ?Current Meds  ?Medication Sig  ? apixaban (ELIQUIS) 5 MG TABS tablet Take 1 tablet (5 mg total) by mouth in the morning and at bedtime.  ? aspirin EC 81 MG EC tablet Take 1 tablet (81 mg total) by mouth daily. Swallow whole.  ? carvedilol (COREG) 3.125 MG tablet Take 1 tablet (3.125 mg total) by mouth 2 (two) times daily with a meal.  ? cetirizine (ZYRTEC) 10 MG  tablet Take 10 mg by mouth daily.  ? digoxin (LANOXIN) 0.25 MG tablet TAKE 1 TABLET BY MOUTH EVERY DAY  ? doxazosin (CARDURA) 4 MG tablet Take 1 tablet (4 mg total) by mouth daily.  ? empagliflozin (JARDIANCE) 25 MG TABS tablet Take by mouth daily.  ? ezetimibe (ZETIA) 10 MG tablet TAKE 1 TABLET BY MOUTH EVERY DAY  ? losartan (COZAAR) 25 MG tablet Take 25 mg by mouth daily.  ? metFORMIN (GLUCOPHAGE) 1000 MG tablet Take 1,000 mg by mouth 2 (two) times daily.  ? omeprazole (PRILOSEC) 20 MG capsule Take 20 mg by mouth daily.  ? rosuvastatin (CRESTOR) 20 MG tablet Take 1 tablet (20 mg total) by mouth daily.  ? tamsulosin (FLOMAX) 0.4 MG CAPS capsule Take 0.4 mg by mouth.  ? TRADJENTA 5 MG TABS tablet Take 5 mg by mouth daily.  ? Vitamin D, Cholecalciferol, 25 MCG (1000 UT) TABS Take by mouth.  ?  ? ?Allergies:   Atorvastatin  ? ?Social History  ? ?Socioeconomic History  ? Marital status: Divorced  ?  Spouse name: Not on file  ? Number of children: Not on file  ? Years of education: Not on file  ? Highest education level: Not on file  ?Occupational History  ? Not on file  ?Tobacco Use  ? Smoking status: Former  ? Smokeless tobacco: Never  ? Tobacco comments:  ?  quit over 30 years ago  ?Vaping Use  ? Vaping Use: Never used  ?Substance and Sexual Activity  ? Alcohol use: No  ? Drug use: Not on file  ? Sexual activity: Not on file  ?Other Topics Concern  ? Not on file  ?Social History Narrative  ? Not on file  ? ?Social Determinants of Health  ? ?Financial Resource Strain: Not on file  ?Food Insecurity: Not on file  ?Transportation Needs: Not on file  ?Physical Activity: Not on file  ?Stress: Not on file  ?Social Connections: Not on file  ?  ? ?Family History: ?The patient's family history includes Heart attack in his brother, father, mother, and sister. ? ?ROS:   ?Please see the history of present illness.    ?(+) Urinary frequency ?All other systems reviewed and are negative. ? ?EKGs/Labs/Other Studies Reviewed:   ? ?The  following studies were reviewed today: ? ?Intraoperative TEE 10/18/2020: ?POST-OP IMPRESSIONS  ?- Aorta: The aorta appears unchanged from pre-bypass.  ?- Aortic Valve: The aortic valve appears unchanged from pre-bypass.  ?- Mitral Valve: The mitral valve appears unchanged from pre-bypass.  ?- Tricuspid Valve: The tricuspid valve appears unchanged from pre-bypass.  ?- Interatrial Septum: The interatrial septum appears unchanged from  ?pre-bypass.  ? ?Echo 10/16/2020: ? 1. Left ventricular ejection fraction, by estimation, is 50 to 55%. The  ?left ventricle has low normal function. The left ventricle demonstrates  ?  regional wall motion abnormalities (see scoring diagram/findings for  ?description). Left ventricular diastolic  ? parameters are consistent with Grade I diastolic dysfunction (impaired  ?relaxation).  ? 2. Right ventricular systolic function is normal. The right ventricular  ?size is mildly enlarged.  ? 3. Left atrial size was mild to moderately dilated.  ? 4. The mitral valve is grossly normal. No evidence of mitral valve  ?regurgitation.  ? 5. The aortic valve was not well visualized. Aortic valve regurgitation  ?is not visualized.  ? 6. The inferior vena cava is normal in size with <50% respiratory  ?variability, suggesting right atrial pressure of 8 mmHg.  ? ?Comparison(s): No prior Echocardiogram.  ? ?LHC 10/16/2020: ?Mid RCA to Dist RCA lesion is 100% stenosed. ?RV Branch lesion is 90% stenosed. ?--------------------------- ?Prox LAD to Mid LAD lesion is 80% stenosed with 70% stenosed side branch in 1st Diag & 1st Sept lesion is 70% stenosed. ?Mid LAD lesion is 75% stenosed. ?--------------------------- ?Prox Cx lesion is 75% stenosed. ?Lat 3rd Mrg lesion is 80% stenosed. ?Diminutive 4th Mrg lesion is 90% stenosed. ?Dist Cx / OM lesion is 90% stenosed. ?=================== ?There is no aortic valve stenosis. ?LV end diastolic pressure is normal. ?The left ventricular systolic function is  normal. ?There is trivial (1+) mitral regurgitation. ?  ?SUMMARY ?Severe multivessel CAD:  ?Possible subacute occlusion or CTO of RCA after RV marginal branch that has an 90% stenosis, with minimal left to right col

## 2022-03-21 NOTE — Telephone Encounter (Signed)
Patient's daughter returning call for lab results. °

## 2022-03-21 NOTE — Addendum Note (Signed)
Addended by: Kerney Elbe on: 03/21/2022 09:55 AM ? ? Modules accepted: Orders ? ?

## 2022-03-22 ENCOUNTER — Other Ambulatory Visit: Payer: Self-pay | Admitting: Cardiology

## 2022-03-22 ENCOUNTER — Telehealth: Payer: Self-pay | Admitting: Cardiology

## 2022-03-22 DIAGNOSIS — I4819 Other persistent atrial fibrillation: Secondary | ICD-10-CM

## 2022-03-22 NOTE — Telephone Encounter (Signed)
Daughter of the patient called. The patient is scheduled for two follow up appointments , 04/12/22 and 04/16/22. She wanted to know if both appointments were necessary. ? ?If possible, the daughter would like to cancel the appointment 04/12/22 as she has a conflict with that day. Please let the Daughter know what the office decides. ? ?The Daughter can be reached through the MyChart portal as well  ?

## 2022-03-22 NOTE — Patient Instructions (Signed)
? ? ? ? ? LONALD TROIANI ? 03/22/2022  ?  ? @PREFPERIOPPHARMACY @ ? ? Your procedure is scheduled on  03/27/2022. ? ? Report to 05/27/2022 at  0915 A.M. ? ? Call this number if you have problems the morning of surgery: ? [phone] ? ? Remember: ? Do not eat or drink after midnight. ? ?DO NOT miss any doses of eliquis before your procedure. ?  ?  ? Take these medicines the morning of surgery with A SIP OF WATER ? ?zyrtec, digoxin, omeprazole, flomax. ? ?  ? Do not wear jewelry, make-up or nail polish. ? Do not wear lotions, powders, or perfumes, or deodorant. ? Do not shave 48 hours prior to surgery.  Men may shave face and neck. ? Do not bring valuables to the hospital. ? [facility] is not responsible for any belongings or valuables. ? ?Contacts, dentures or bridgework may not be worn into surgery.  Leave your suitcase in the car.  After surgery it may be brought to your room. ? ?For patients admitted to the hospital, discharge time will be determined by your treatment team. ? ?Patients discharged the day of surgery will not be allowed to drive home and must have someone with them for 24 hours.  ? ? ?Special instructions:   DO NOT smoke tobacco or vape for 24 hours before your procedure. ? ?Please read over the following fact sheets that you were given. ?Anesthesia Post-op Instructions and Care and Recovery After Surgery ?  ? ? ? Electrical Cardioversion ?Electrical cardioversion is the delivery of a jolt of electricity to restore a normal rhythm to the heart. A rhythm that is too fast or is not regular keeps the heart from pumping well. In this procedure, sticky patches or metal paddles are placed on the chest to deliver electricity to the heart from a device. ?This procedure may be done in an emergency if: ?There is low or no blood pressure as a result of the heart rhythm. ?Normal rhythm must be restored as fast as possible to protect the brain and heart from further damage. ?It may save a life. ?This may also  be a scheduled procedure for irregular or fast heart rhythms that are not immediately life-threatening. ?Tell a health care provider about: ?Any allergies you have. ?All medicines you are taking, including vitamins, herbs, eye drops, creams, and over-the-counter medicines. ?Any problems you or family members have had with anesthetic medicines. ?Any blood disorders you have. ?Any surgeries you have had. ?Any medical conditions you have. ?Whether you are pregnant or may be pregnant. ?What are the risks? ?Generally, this is a safe procedure. However, problems may occur, including: ?Allergic reactions to medicines. ?A blood clot that breaks free and travels to other parts of your body. ?The possible return of an abnormal heart rhythm within hours or days after the procedure. ?Your heart stopping (cardiac arrest). This is rare. ?What happens before the procedure? ?Medicines ?Your health care provider may have you start taking: ?Blood-thinning medicines (anticoagulants) so your blood does not clot as easily. ?Medicines to help stabilize your heart rate and rhythm. ?Ask your health care provider about: ?Changing or stopping your regular medicines. This is especially important if you are taking diabetes medicines or blood thinners. ?Taking medicines such as aspirin and ibuprofen. These medicines can thin your blood. Do not take these medicines unless your health care provider tells you to take them. ?Taking over-the-counter medicines, vitamins, herbs, and supplements. ?General instructions ?Follow instructions from your  health care provider about eating or drinking restrictions. ?Plan to have someone take you home from the hospital or clinic. ?If you will be going home right after the procedure, plan to have someone with you for 24 hours. ?Ask your health care provider what steps will be taken to help prevent infection. These may include washing your skin with a germ-killing soap. ?What happens during the procedure? ? ?An  IV will be inserted into one of your veins. ?Sticky patches (electrodes) or metal paddles may be placed on your chest. ?You will be given a medicine to help you relax (sedative). ?An electrical shock will be delivered. ?The procedure may vary among health care providers and hospitals. ?What can I expect after the procedure? ?Your blood pressure, heart rate, breathing rate, and blood oxygen level will be monitored until you leave the hospital or clinic. ?Your heart rhythm will be watched to make sure it does not change. ?You may have some redness on the skin where the shocks were given. ?Follow these instructions at home: ?Do not drive for 24 hours if you were given a sedative during your procedure. ?Take over-the-counter and prescription medicines only as told by your health care provider. ?Ask your health care provider how to check your pulse. Check it often. ?Rest for 48 hours after the procedure or as told by your health care provider. ?Avoid or limit your caffeine use as told by your health care provider. ?Keep all follow-up visits as told by your health care provider. This is important. ?Contact a health care provider if: ?You feel like your heart is beating too quickly or your pulse is not regular. ?You have a serious muscle cramp that does not go away. ?Get help right away if: ?You have discomfort in your chest. ?You are dizzy or you feel faint. ?You have trouble breathing or you are short of breath. ?Your speech is slurred. ?You have trouble moving an arm or leg on one side of your body. ?Your fingers or toes turn cold or blue. ?Summary ?Electrical cardioversion is the delivery of a jolt of electricity to restore a normal rhythm to the heart. ?This procedure may be done right away in an emergency or may be a scheduled procedure if the condition is not an emergency. ?Generally, this is a safe procedure. ?After the procedure, check your pulse often as told by your health care provider. ?This information is not  intended to replace advice given to you by your health care provider. Make sure you discuss any questions you have with your health care provider. ?Document Revised: 07/12/2019 Document Reviewed: 07/12/2019 ?Elsevier Patient Education ? 2022 [employer] ?Monitored Anesthesia Care, Care After ?This sheet gives you information about how to care for yourself after your procedure. Your health care provider may also give you more specific instructions. If you have problems or questions, contact your health care provider. ?What can I expect after the procedure? ?After the procedure, it is common to have: ?Tiredness. ?Forgetfulness about what happened after the procedure. ?Impaired judgment for important decisions. ?Nausea or vomiting. ?Some difficulty with balance. ?Follow these instructions at home: ?For the time period you were told by your health care provider: ?  ?Rest as needed. ?Do not participate in activities where you could fall or become injured. ?Do not drive or use machinery. ?Do not drink alcohol. ?Do not take sleeping pills or medicines that cause drowsiness. ?Do not make important decisions or sign legal documents. ?Do not take care of children on your own. ?  Eating and drinking ?Follow the diet that is recommended by your health care provider. ?Drink enough fluid to keep your urine pale yellow. ?If you vomit: ?Drink water, juice, or soup when you can drink without vomiting. ?Make sure you have little or no nausea before eating solid foods. ?General instructions ?Have a responsible adult stay with you for the time you are told. It is important to have someone help care for you until you are awake and alert. ?Take over-the-counter and prescription medicines only as told by your health care provider. ?If you have sleep apnea, surgery and certain medicines can increase your risk for breathing problems. Follow instructions from your health care provider about wearing your sleep device: ?Anytime you are  sleeping, including during daytime naps. ?While taking prescription pain medicines, sleeping medicines, or medicines that make you drowsy. ?Avoid smoking. ?Keep all follow-up visits as told by your health car

## 2022-03-22 NOTE — Telephone Encounter (Signed)
Canceled 4/21 appt per daughters request. Pt has appt with B. Strader, PA-C on 04/16/22. ?

## 2022-03-25 ENCOUNTER — Encounter (HOSPITAL_COMMUNITY): Payer: Self-pay

## 2022-03-25 ENCOUNTER — Encounter (HOSPITAL_COMMUNITY)
Admission: RE | Admit: 2022-03-25 | Discharge: 2022-03-25 | Disposition: A | Discharge status: Home / Self Care | Payer: Medicare Other | Source: Ambulatory Visit | Attending: Cardiology | Admitting: Cardiology

## 2022-03-25 DIAGNOSIS — I4819 Other persistent atrial fibrillation: Secondary | ICD-10-CM | POA: Diagnosis not present

## 2022-03-25 DIAGNOSIS — Z01812 Encounter for preprocedural laboratory examination: Secondary | ICD-10-CM | POA: Insufficient documentation

## 2022-03-25 LAB — BASIC METABOLIC PANEL
Anion gap: 8 (ref 5–15)
BUN: 11 mg/dL (ref 8–23)
CO2: 24 mmol/L (ref 22–32)
Calcium: 8.6 mg/dL — ABNORMAL LOW (ref 8.9–10.3)
Chloride: 107 mmol/L (ref 98–111)
Creatinine, Ser: 0.86 mg/dL (ref 0.61–1.24)
GFR, Estimated: >60 mL/min (ref >60)
Glucose, Bld: 118 mg/dL — ABNORMAL HIGH (ref 70–99)
Potassium: 3.9 mmol/L (ref 3.5–5.1)
Sodium: 139 mmol/L (ref 135–145)

## 2022-03-25 LAB — PROTIME-INR
INR: 1.2 (ref 0.8–1.2)
Prothrombin Time: 14.7 seconds (ref 11.4–15.2)

## 2022-03-27 ENCOUNTER — Ambulatory Visit (HOSPITAL_COMMUNITY): Payer: Medicare Other | Admitting: Anesthesiology

## 2022-03-27 ENCOUNTER — Other Ambulatory Visit: Payer: Self-pay

## 2022-03-27 ENCOUNTER — Encounter (HOSPITAL_COMMUNITY)
Admission: RE | Disposition: A | Discharge status: Home / Self Care | Payer: Self-pay | Source: Home / Self Care | Attending: Cardiology

## 2022-03-27 ENCOUNTER — Encounter (HOSPITAL_COMMUNITY): Payer: Self-pay | Admitting: Cardiology

## 2022-03-27 ENCOUNTER — Ambulatory Visit (HOSPITAL_COMMUNITY)
Admission: RE | Admit: 2022-03-27 | Discharge: 2022-03-27 | Disposition: A | Discharge status: Home / Self Care | Payer: Medicare Other | Attending: Cardiology | Admitting: Cardiology

## 2022-03-27 ENCOUNTER — Ambulatory Visit (HOSPITAL_BASED_OUTPATIENT_CLINIC_OR_DEPARTMENT_OTHER): Payer: Medicare Other | Admitting: Anesthesiology

## 2022-03-27 DIAGNOSIS — I4819 Other persistent atrial fibrillation: Secondary | ICD-10-CM

## 2022-03-27 DIAGNOSIS — I2510 Atherosclerotic heart disease of native coronary artery without angina pectoris: Secondary | ICD-10-CM | POA: Insufficient documentation

## 2022-03-27 DIAGNOSIS — I252 Old myocardial infarction: Secondary | ICD-10-CM | POA: Insufficient documentation

## 2022-03-27 DIAGNOSIS — I2583 Coronary atherosclerosis due to lipid rich plaque: Secondary | ICD-10-CM | POA: Diagnosis not present

## 2022-03-27 DIAGNOSIS — Z7901 Long term (current) use of anticoagulants: Secondary | ICD-10-CM | POA: Insufficient documentation

## 2022-03-27 DIAGNOSIS — I480 Paroxysmal atrial fibrillation: Secondary | ICD-10-CM | POA: Insufficient documentation

## 2022-03-27 DIAGNOSIS — Z951 Presence of aortocoronary bypass graft: Secondary | ICD-10-CM | POA: Insufficient documentation

## 2022-03-27 DIAGNOSIS — E7800 Pure hypercholesterolemia, unspecified: Secondary | ICD-10-CM | POA: Insufficient documentation

## 2022-03-27 DIAGNOSIS — I10 Essential (primary) hypertension: Secondary | ICD-10-CM | POA: Diagnosis not present

## 2022-03-27 DIAGNOSIS — Z79899 Other long term (current) drug therapy: Secondary | ICD-10-CM | POA: Insufficient documentation

## 2022-03-27 HISTORY — PX: CARDIOVERSION: SHX1299

## 2022-03-27 LAB — GLUCOSE, CAPILLARY: Glucose-Capillary: 154 mg/dL — ABNORMAL HIGH (ref 70–99)

## 2022-03-27 SURGERY — CARDIOVERSION
Anesthesia: General

## 2022-03-27 MED ORDER — ORAL CARE MOUTH RINSE: 15.0000 mL | Freq: Once | OROMUCOSAL | Status: AC

## 2022-03-27 MED ORDER — LACTATED RINGERS IV SOLN: INTRAVENOUS | Status: DC

## 2022-03-27 MED ORDER — PROPOFOL 10 MG/ML IV BOLUS
INTRAVENOUS | Status: DC | PRN
Start: 2022-03-27 — End: 2022-03-27
  Administered 2022-03-27 (×2): 50 mg via INTRAVENOUS

## 2022-03-27 MED ORDER — CHLORHEXIDINE GLUCONATE 0.12 % MT SOLN
15.0000 mL | Freq: Once | OROMUCOSAL | Status: AC
  Administered 2022-03-27: 15 mL via OROMUCOSAL

## 2022-03-27 NOTE — Anesthesia Postprocedure Evaluation (Signed)
Anesthesia Post Note ? ?Patient: Herbert Torres ? ?Procedure(s) Performed: CARDIOVERSION ? ?Patient location during evaluation: PACU ?Anesthesia Type: General ?Level of consciousness: awake and alert and oriented ?Pain management: pain level controlled ?Vital Signs Assessment: post-procedure vital signs reviewed and stable ?Respiratory status: spontaneous breathing, nonlabored ventilation and respiratory function stable ?Cardiovascular status: blood pressure returned to baseline and stable ?Postop Assessment: no apparent nausea or vomiting ?Anesthetic complications: no ? ? ?No notable events documented. ? ? ?Last Vitals:  ?Vitals:  ? 03/27/22 1046 03/27/22 1100  ?BP: (!) 138/55 (!) 124/59  ?Pulse: 65 66  ?Resp: 18 (!) 21  ?Temp: 36.4 ?C   ?SpO2: 100% 98%  ?  ?Last Pain:  ?Vitals:  ? 03/27/22 1046  ?TempSrc:   ?PainSc: 0-No pain  ? ? ?  ?  ?  ?  ?  ?  ? ?Marquelle Balow C Aislyn Hayse ? ? ? ? ?

## 2022-03-27 NOTE — Anesthesia Preprocedure Evaluation (Signed)
Anesthesia Evaluation  ?Patient identified by MRN, date of birth, ID band ?Patient awake ? ? ? ?Reviewed: ?Allergy & Precautions, NPO status , Patient's Chart, lab work & pertinent test results, reviewed documented beta blocker date and time  ? ?Airway ?Mallampati: II ? ?TM Distance: >3 FB ?Neck ROM: Full ? ? ? Dental ? ?(+) Lower Dentures, Upper Dentures ?  ?Pulmonary ?neg pulmonary ROS, former smoker,  ?  ?Pulmonary exam normal ?breath sounds clear to auscultation ? ? ? ? ? ? Cardiovascular ?hypertension, Pt. on medications and Pt. on home beta blockers ?+ CAD, + Past MI and + CABG  ?+ dysrhythmias Atrial Fibrillation  ?Rhythm:Irregular Rate:Normal ? ? ?  ?Neuro/Psych ?negative neurological ROS ? negative psych ROS  ? GI/Hepatic ?Neg liver ROS, GERD  Medicated,  ?Endo/Other  ?negative endocrine ROSdiabetes, Well Controlled, Type 2, Oral Hypoglycemic Agents ? Renal/GU ?negative Renal ROS  ?negative genitourinary ?  ?Musculoskeletal ?negative musculoskeletal ROS ?(+)  ? Abdominal ?  ?Peds ?negative pediatric ROS ?(+)  Hematology ?negative hematology ROS ?(+)   ?Anesthesia Other Findings ? ? Reproductive/Obstetrics ?negative OB ROS ? ?  ? ? ? ? ? ? ? ? ? ? ? ? ? ?  ?  ? ? ? ? ? ? ? ? ?Anesthesia Physical ?Anesthesia Plan ? ?ASA: 3 ? ?Anesthesia Plan: General  ? ?Post-op Pain Management: Minimal or no pain anticipated  ? ?Induction: Intravenous ? ?PONV Risk Score and Plan: TIVA ? ?Airway Management Planned: Nasal Cannula and Natural Airway ? ?Additional Equipment:  ? ?Intra-op Plan:  ? ?Post-operative Plan:  ? ?Informed Consent: I have reviewed the patients History and Physical, chart, labs and discussed the procedure including the risks, benefits and alternatives for the proposed anesthesia with the patient or authorized representative who has indicated his/her understanding and acceptance.  ? ? ? ? ? ?Plan Discussed with: CRNA and Surgeon ? ?Anesthesia Plan Comments:    ? ? ? ? ? ? ?Anesthesia Quick Evaluation ? ?

## 2022-03-27 NOTE — CV Procedure (Addendum)
CV Procedure Note ? ?Procedure: Electrical cardioversion ?Physician: Dr Dina Rich ?Indication: Peristent atrial fibrillation ? ? ?Patient was brought to the procedure suite after appropriate consent was obtained. I independently confirmed with patient he had not missed any doses of eliquis within the last 3 weeks. Defib pads placed in the anterior and posterior position. Sedation achieved with the assistance of anesthesiology, for details please refer to there note. Succesfully cardioverted to normal sinus rhythm with a single synchronized 200j shock. Cardiopulmonary monitoring performed throughout the procedure, he tolerated well without complications. ? ?Rates in recovery 55-60, we will d/c his digoxin. Continue toprol at current dose.  ? ? ?Dina Rich MD ?

## 2022-03-27 NOTE — Progress Notes (Signed)
Electrical Cardioversion Procedure Note ?LENNART GLADISH ?[id number] ?1946/02/14 ? ?Procedure: Electrical Cardioversion ?Indications:  Atrial Fibrillation ? ?Procedure Details ?Consent: Risks of procedure as well as the alternatives and risks of each were explained to the (patient/caregiver).  Consent for procedure obtained. ?Time Out: Verified patient identification, verified procedure, site/side was marked, verified correct patient position, special equipment/implants available, medications/allergies/relevent history reviewed, required imaging and test results available.  Performed ? ?Patient placed on cardiac monitor, pulse oximetry, supplemental oxygen as necessary.  ?Sedation given: propofol by CRNA ?Pacer pads placed anterior and posterior chest. ? ?Cardioverted 1 time(s).  ?Cardioverted at 200J.at 1040 ? ?Evaluation ?Findings: Post procedure EKG shows: NSR with PACs at rate of 69 ?Complications: None ?Patient did tolerate procedure well. ? ? ?Andrey Cota ?03/27/2022, 11:31 AM ? ? ? ?

## 2022-03-27 NOTE — Transfer of Care (Signed)
Immediate Anesthesia Transfer of Care Note ? ?Patient: Herbert Torres ? ?Procedure(s) Performed: CARDIOVERSION ? ?Patient Location: PACU ? ?Anesthesia Type:MAC ? ?Level of Consciousness: awake, alert , oriented and patient cooperative ? ?Airway & Oxygen Therapy: Patient Spontanous Breathing and Patient connected to nasal cannula oxygen ? ?Post-op Assessment: Report given to RN, Post -op Vital signs reviewed and stable and Patient moving all extremities X 4 ? ?Post vital signs: Reviewed and stable ? ?Last Vitals:  ?Vitals Value Taken Time  ?BP    ?Temp    ?Pulse    ?Resp    ?SpO2    ? ? ?Last Pain:  ?Vitals:  ? 03/27/22 0959  ?TempSrc: Oral  ?PainSc: 0-No pain  ?   ? ?  ? ?Complications: No notable events documented. ?

## 2022-03-27 NOTE — H&P (Signed)
Patient presents for electrical cardioversion for history of atrial fibrillation. Has been on eliquis at home for anticoagulation.Please see referenced clinic note below for full medical history.  Plan for cardioversion today with the assistance of anethesiology.  ? ?Cardiology Office Note:   ?  ?Date:  03/21/2022  ?  ?ID:  Herbert Torres, DOB 1946/01/14, MRN [id number] ?  ?PCP:  [facility] ?             ?[facility] Providers ?Cardiologist:  Chilton Si, MD    ?  ?Referring MD: [facility]*  ?  ?   ?Chief Complaint  ?Patient presents with  ? Coronary Artery Disease  ? Hypertension  ? Atrial Fibrillation  ? Hyperlipidemia  ?  ?  ?  ?History of Present Illness:   ?  ?Herbert Torres is a 76 y.o. male with a hx of coronary artery disease, diabetes mellitus, GERD, hyperlipidemia, and hypertension.  He also has a history of CAD status post NSTEMI in 2021. Cath revealed multi-vessel CAD. He underwent CABG x4 and left atrial clipping on 10/18/20. His hospitalization was complicated by post-op atrial fibrillation. He converted to sinus rhythm on amiodarone.  He went back into atrial fibrillation and had a cardioversion 10/28/20.  ?  ?He is here today for followup.  He tells me that he has recently been having palpitations during the day and at night which wake him up.  He also has been having problems with dizziness throughout the day.  He describes it as a swimmy headed.  He notices it when he gets up in the am.  He thinks it is vertigo and feels like he is off balance.  He has had some allergies problems and allergic rhinitis.  This has been going on for about a month.  He tells me that he had vague symptoms when he had his MI and had no CP or SOB.  He recently started having DOE and has had to stop walking around the track due to significant SOB.  He denies any anginal chest pain or pressure, PND, orthopnea, LE edema or syncope. He is compliant with his meds and is tolerating meds  with no SE.    ?  ?  ?    ?Past Medical History:  ?Diagnosis Date  ? Coronary artery disease    ? Diabetes mellitus without complication (HCC)    ? GERD (gastroesophageal reflux disease)    ? HOH (hard of hearing)    ?  AIDS  ? Hyperlipidemia    ? Hypertension    ?  ?  ?     ?Past Surgical History:  ?Procedure Laterality Date  ? APPENDECTOMY      ? CARDIOVERSION N/A 10/27/2020  ?  Procedure: CARDIOVERSION;  Surgeon: Lewayne Bunting, MD;  Location: [facility] ENDOSCOPY;  Service: Cardiovascular;  Laterality: N/A;  ? CATARACT EXTRACTION W/PHACO Right 02/26/2018  ?  Procedure: CATARACT EXTRACTION PHACO AND INTRAOCULAR LENS PLACEMENT (IOC);  Surgeon: Nevada Crane, MD;  Location: [facility] ORS;  Service: Ophthalmology;  Laterality: Right;  fluid pak loT# [id number] H  exp10/31/2020 ?[place]   00:38.9 ?AP%   16.3 ?CDE   6.33  ? CLIPPING OF ATRIAL APPENDAGE Left 10/18/2020  ?  Procedure: CLIPPING OF LEFT ATRIAL APPENDAGE USING ATRICURE ATRICLIP SIZE 40;  Surgeon: Corliss Skains, MD;  Location: [facility] OR;  Service: Open Heart Surgery;  Laterality: Left;  ? COLONOSCOPY      ? CORONARY ARTERY BYPASS GRAFT N/A  10/18/2020  ?  Procedure: CORONARY ARTERY BYPASS GRAFTING (CABG) TIMES FOUR USING LEFT INTERNAL MAMMARY ARTERY AND RIGHT LEG GREATER SAPHENOUS VEIN HARVESTED ENDOSCOPICALLY Flowtrack only;  Surgeon: Corliss Skains, MD;  Location: [facility] OR;  Service: Open Heart Surgery;  Laterality: N/A;  ? IR THORACENTESIS ASP PLEURAL SPACE W/IMG GUIDE   10/23/2020  ? LEFT HEART CATH AND CORONARY ANGIOGRAPHY N/A 10/16/2020  ?  Procedure: LEFT HEART CATH AND CORONARY ANGIOGRAPHY;  Surgeon: Marykay Lex, MD;  Location: [facility] INVASIVE CV LAB;  Service: Cardiovascular;  Laterality: N/A;  ? TEE WITHOUT CARDIOVERSION N/A 10/18/2020  ?  Procedure: TRANSESOPHAGEAL ECHOCARDIOGRAM (TEE);  Surgeon: Corliss Skains, MD;  Location: [facility] OR;  Service: Open Heart Surgery;  Laterality: N/A;  ?  ?  ?Current Medications: ?Active Medications  ?    ?Current Meds   ?Medication Sig  ? apixaban (ELIQUIS) 5 MG TABS tablet Take 1 tablet (5 mg total) by mouth in the morning and at bedtime.  ? aspirin EC 81 MG EC tablet Take 1 tablet (81 mg total) by mouth daily. Swallow whole.  ? carvedilol (COREG) 3.125 MG tablet Take 1 tablet (3.125 mg total) by mouth 2 (two) times daily with a meal.  ? cetirizine (ZYRTEC) 10 MG tablet Take 10 mg by mouth daily.  ? digoxin (LANOXIN) 0.25 MG tablet TAKE 1 TABLET BY MOUTH EVERY DAY  ? doxazosin (CARDURA) 4 MG tablet Take 1 tablet (4 mg total) by mouth daily.  ? empagliflozin (JARDIANCE) 25 MG TABS tablet Take by mouth daily.  ? ezetimibe (ZETIA) 10 MG tablet TAKE 1 TABLET BY MOUTH EVERY DAY  ? losartan (COZAAR) 25 MG tablet Take 25 mg by mouth daily.  ? metFORMIN (GLUCOPHAGE) 1000 MG tablet Take 1,000 mg by mouth 2 (two) times daily.  ? omeprazole (PRILOSEC) 20 MG capsule Take 20 mg by mouth daily.  ? rosuvastatin (CRESTOR) 20 MG tablet Take 1 tablet (20 mg total) by mouth daily.  ? tamsulosin (FLOMAX) 0.4 MG CAPS capsule Take 0.4 mg by mouth.  ? TRADJENTA 5 MG TABS tablet Take 5 mg by mouth daily.  ? Vitamin D, Cholecalciferol, 25 MCG (1000 UT) TABS Take by mouth.  ?  ?  ?  ?Allergies:   Atorvastatin  ?  ?Social History  ?  ?     ?Socioeconomic History  ? Marital status: Divorced  ?    Spouse name: Not on file  ? Number of children: Not on file  ? Years of education: Not on file  ? Highest education level: Not on file  ?Occupational History  ? Not on file  ?Tobacco Use  ? Smoking status: Former  ? Smokeless tobacco: Never  ? Tobacco comments:  ?    quit over 30 years ago  ?Vaping Use  ? Vaping Use: Never used  ?Substance and Sexual Activity  ? Alcohol use: No  ? Drug use: Not on file  ? Sexual activity: Not on file  ?Other Topics Concern  ? Not on file  ?Social History Narrative  ? Not on file  ?  ?Social Determinants of Health  ?  ?Financial Resource Strain: Not on file  ?Food Insecurity: Not on file  ?Transportation Needs: Not on file   ?Physical Activity: Not on file  ?Stress: Not on file  ?Social Connections: Not on file  ?  ?  ?Family History: ?The patient's family history includes Heart attack in his brother, father, mother, and sister. ?  ?ROS:   ?Please  see the history of present illness.    ?(+) Urinary frequency ?All other systems reviewed and are negative. ?  ?EKGs/Labs/Other Studies Reviewed:   ?  ?The following studies were reviewed today: ?  ?Intraoperative TEE 10/18/2020: ?POST-OP IMPRESSIONS  ?- Aorta: The aorta appears unchanged from pre-bypass.  ?- Aortic Valve: The aortic valve appears unchanged from pre-bypass.  ?- Mitral Valve: The mitral valve appears unchanged from pre-bypass.  ?- Tricuspid Valve: The tricuspid valve appears unchanged from pre-bypass.  ?- Interatrial Septum: The interatrial septum appears unchanged from  ?pre-bypass.  ?  ?Echo 10/16/2020: ? 1. Left ventricular ejection fraction, by estimation, is 50 to 55%. The  ?left ventricle has low normal function. The left ventricle demonstrates  ?regional wall motion abnormalities (see scoring diagram/findings for  ?description). Left ventricular diastolic  ? parameters are consistent with Grade I diastolic dysfunction (impaired  ?relaxation).  ? 2. Right ventricular systolic function is normal. The right ventricular  ?size is mildly enlarged.  ? 3. Left atrial size was mild to moderately dilated.  ? 4. The mitral valve is grossly normal. No evidence of mitral valve  ?regurgitation.  ? 5. The aortic valve was not well visualized. Aortic valve regurgitation  ?is not visualized.  ? 6. The inferior vena cava is normal in size with <50% respiratory  ?variability, suggesting right atrial pressure of 8 mmHg.  ? ?Comparison(s): No prior Echocardiogram.  ?  ?LHC 10/16/2020: ?Mid RCA to Dist RCA lesion is 100% stenosed. ?RV Kimberlyn Quiocho lesion is 90% stenosed. ?--------------------------- ?Prox LAD to Mid LAD lesion is 80% stenosed with 70% stenosed side Anitha Kreiser in 1st Diag & 1st Sept  lesion is 70% stenosed. ?Mid LAD lesion is 75% stenosed. ?--------------------------- ?Prox Cx lesion is 75% stenosed. ?Lat 3rd Mrg lesion is 80% stenosed. ?Diminutive 4th Mrg lesion is 90% stenosed. ?Dist Cx / OM

## 2022-03-28 ENCOUNTER — Ambulatory Visit: Payer: Medicare Other | Admitting: Cardiology

## 2022-04-02 ENCOUNTER — Telehealth: Payer: Self-pay | Admitting: Cardiology

## 2022-04-02 ENCOUNTER — Encounter (HOSPITAL_COMMUNITY): Payer: Self-pay | Admitting: Cardiology

## 2022-04-02 NOTE — Addendum Note (Signed)
Addendum  created 04/02/22 1100 by Shanon Payor, CRNA  ? Intraprocedure Event edited, Intraprocedure Staff edited  ?  ?

## 2022-04-02 NOTE — Telephone Encounter (Signed)
Left message with daughter asking if doxazosin was for htn or prostrate. ?

## 2022-04-02 NOTE — Telephone Encounter (Signed)
?  STAT if patient feels like he/she is going to faint  ? ?Are you dizzy now? No  ? ?Do you feel faint or have you passed out? No  ? ?Do you have any other symptoms? None  ? ?Have you checked your HR and BP (record if available)? 92/40 ? ?Pt's daughter calling, she said. Pt called him this morning because he has dizzy spells. Also, pt mention on Saturday night pt had a heart flutter. They are concern with Pt's low BP and they wanted to check in pt this causing by new meds metoprolol  ?

## 2022-04-02 NOTE — Telephone Encounter (Signed)
Yes, daughter confirmed patient is taking doxazosin. ?

## 2022-04-02 NOTE — Telephone Encounter (Signed)
Office visit from 03/21/22 stated continue Eliquis and Coreg. At visit discharge, patient was instructed to stop coreg and start Toprol xl 25 mg qd. ? ?Daughter states it was after starting Metoprolol patient has c/o dizziness but today was the first time he checked his BP and found it to be 92/40. He did not note heart rate. ? ? ? ?Please advise. ?

## 2022-04-03 MED ORDER — DOXAZOSIN MESYLATE 2 MG PO TABS: 2.0000 mg | ORAL_TABLET | Freq: Every day | ORAL | 3 refills | Status: DC

## 2022-04-03 NOTE — Telephone Encounter (Signed)
Daughter was returning call. Please advise  ?

## 2022-04-03 NOTE — Telephone Encounter (Signed)
Daughter Sharyn Lull notified and voiced understanding to decrease Doxazosin to 2 mg Daily and call with BP's and Hr in one week.  ?

## 2022-04-03 NOTE — Telephone Encounter (Signed)
Spoke with daughter who states that pt was prescribed Doxazosin by Dr. Duke Salvia for his heart. The only med that he is taking for his prostate is Flomax. Please advise.  ?

## 2022-04-05 ENCOUNTER — Ambulatory Visit (HOSPITAL_COMMUNITY)
Admission: RE | Admit: 2022-04-05 | Discharge: 2022-04-05 | Disposition: A | Discharge status: Home / Self Care | Payer: Medicare Other | Source: Ambulatory Visit | Attending: Cardiology | Admitting: Cardiology

## 2022-04-05 DIAGNOSIS — I480 Paroxysmal atrial fibrillation: Secondary | ICD-10-CM | POA: Insufficient documentation

## 2022-04-05 LAB — ECHOCARDIOGRAM COMPLETE
AR max vel: 2.09 cm2
AV Area VTI: 2.23 cm2
AV Area mean vel: 2.18 cm2
AV Mean grad: 5 mmHg
AV Peak grad: 10.8 mmHg
Ao pk vel: 1.64 m/s
Area-P 1/2: 3.01 cm2
Calc EF: 52.9 %
MV VTI: 2.4 cm2
S' Lateral: 3.5 cm
Single Plane A2C EF: 48.2 %
Single Plane A4C EF: 52.6 %

## 2022-04-05 NOTE — Progress Notes (Incomplete)
*  PRELIMINARY RESULTS* ?Echocardiogram ?2D Echocardiogram has been performed. ? ?Herbert Torres ?04/05/2022, 11:38 AM ?

## 2022-04-12 ENCOUNTER — Ambulatory Visit: Payer: Medicare Other | Admitting: Student

## 2022-04-16 ENCOUNTER — Encounter: Payer: Self-pay | Admitting: Student

## 2022-04-16 ENCOUNTER — Ambulatory Visit: Payer: Medicare Other | Admitting: Student

## 2022-04-16 VITALS — BP 132/66 | HR 107 | Wt 208.0 lb

## 2022-04-16 DIAGNOSIS — I480 Paroxysmal atrial fibrillation: Secondary | ICD-10-CM

## 2022-04-16 DIAGNOSIS — I2510 Atherosclerotic heart disease of native coronary artery without angina pectoris: Secondary | ICD-10-CM | POA: Diagnosis not present

## 2022-04-16 DIAGNOSIS — E785 Hyperlipidemia, unspecified: Secondary | ICD-10-CM

## 2022-04-16 DIAGNOSIS — I10 Essential (primary) hypertension: Secondary | ICD-10-CM

## 2022-04-16 MED ORDER — AMIODARONE HCL 400 MG PO TABS: ORAL_TABLET | ORAL | 0 refills | Status: DC

## 2022-04-16 MED ORDER — DOXAZOSIN MESYLATE 2 MG PO TABS: 2.0000 mg | ORAL_TABLET | Freq: Every day | ORAL | 3 refills | Status: DC

## 2022-04-16 NOTE — Progress Notes (Signed)
? ?Cardiology Office Note   ? ?Date:  04/16/2022  ? ?ID:  Herbert Torres, DOB 04-29-1946, MRN [id number] ? ?PCP:  Center, [facility]  ?Cardiologist: Armanda Magic, MD   ? ?Chief Complaint  ?Patient presents with  ? Follow-up  ?  S/p DCCV  ? ? ?History of Present Illness:   ? ?Herbert Torres is a 76 y.o. male with past medical history of CAD (s/p CABG x 4 with LIMA-LAD, rSVG-D1, rSVG-OM1 and rSVG-OM3 and LAA clipping in 09/2020), paroxysmal atrial fibrillation (initially following CABG with recurrence in 10/2020 and DCCV performed), HTN, HLD, GERD and Type 2 DM who presents to the office today for 1 month follow-up. ? ?He was last examined by Dr. Mayford Knife in 02/2022 and reported having worsening palpitations which were occurring throughout the day and at night. Symptoms had been persistent for over a month. Repeat EKG was performed and he was found to back in atrial fibrillation. Given his compliance with Eliquis, a DCCV was recommended. If his dyspnea did not improve following restoration of normal sinus rhythm, was recommended to consider a Lexiscan Myoview to rule out recurrent ischemia. He did undergo DCCV by Dr. Wyline Mood on 03/27/2022 and converted back to normal sinus rhythm with a single synchronized 200 J shock.  Given his heart rate in the 50's to 60's during recovery, Digoxin was discontinued. ? ?In talking with the patient and his son today, he reports having variability in his symptoms since prior DCCV. Says that he has "good days and bad days". He was able to cut up wood earlier this week and did not have any symptoms. He does report one episode of chest discomfort at night and EMS was sent out but work-up was reassuring and he did not require transfer to the emergency room. His rhythm strip at that time did show normal sinus rhythm. He does report intermittent palpitations ever since DCCV and is unaware of any precipitating factors. Symptoms are typically worse with exertion. No recent exertional  chest pain, orthopnea, PND or pitting edema. He reports good compliance with Eliquis and has not missed any recent doses. His daughter does help to manage his medications. ? ? ?Past Medical History:  ?Diagnosis Date  ? Coronary artery disease   ? a. s/p CABG x 4 with LIMA-LAD, rSVG-D1, rSVG-OM1 and rSVG-OM3 and LAA clipping in 09/2020  ? Diabetes mellitus without complication (HCC)   ? GERD (gastroesophageal reflux disease)   ? HOH (hard of hearing)   ? AIDS  ? Hyperlipidemia   ? Hypertension   ? PAF (paroxysmal atrial fibrillation) (HCC)   ? ? ?Past Surgical History:  ?Procedure Laterality Date  ? APPENDECTOMY    ? CARDIOVERSION N/A 10/27/2020  ? Procedure: CARDIOVERSION;  Surgeon: Lewayne Bunting, MD;  Location: [facility] ENDOSCOPY;  Service: Cardiovascular;  Laterality: N/A;  ? CARDIOVERSION N/A 03/27/2022  ? Procedure: CARDIOVERSION;  Surgeon: Antoine Poche, MD;  Location: [facility] ORS;  Service: Endoscopy;  Laterality: N/A;  ? CATARACT EXTRACTION W/PHACO Right 02/26/2018  ? Procedure: CATARACT EXTRACTION PHACO AND INTRAOCULAR LENS PLACEMENT (IOC);  Surgeon: Nevada Crane, MD;  Location: [facility] ORS;  Service: Ophthalmology;  Laterality: Right;  fluid pak loT# [id number] H  exp10/31/2020 ?[place]   00:38.9 ?AP%   16.3 ?CDE   6.33  ? CLIPPING OF ATRIAL APPENDAGE Left 10/18/2020  ? Procedure: CLIPPING OF LEFT ATRIAL APPENDAGE USING ATRICURE ATRICLIP SIZE 40;  Surgeon: Corliss Skains, MD;  Location: [facility] OR;  Service: Open Heart Surgery;  Laterality: Left;  ? COLONOSCOPY    ? CORONARY ARTERY BYPASS GRAFT N/A 10/18/2020  ? Procedure: CORONARY ARTERY BYPASS GRAFTING (CABG) TIMES FOUR USING LEFT INTERNAL MAMMARY ARTERY AND RIGHT LEG GREATER SAPHENOUS VEIN HARVESTED ENDOSCOPICALLY Flowtrack only;  Surgeon: Corliss Skains, MD;  Location: [facility] OR;  Service: Open Heart Surgery;  Laterality: N/A;  ? IR THORACENTESIS ASP PLEURAL SPACE W/IMG GUIDE  10/23/2020  ? LEFT HEART CATH AND CORONARY ANGIOGRAPHY N/A 10/16/2020  ? Procedure: LEFT  HEART CATH AND CORONARY ANGIOGRAPHY;  Surgeon: Marykay Lex, MD;  Location: [facility] INVASIVE CV LAB;  Service: Cardiovascular;  Laterality: N/A;  ? TEE WITHOUT CARDIOVERSION N/A 10/18/2020  ? Procedure: TRANSESOPHAGEAL ECHOCARDIOGRAM (TEE);  Surgeon: Corliss Skains, MD;  Location: [facility] OR;  Service: Open Heart Surgery;  Laterality: N/A;  ? ? ?Current Medications: ?Outpatient Medications Prior to Visit  ?Medication Sig Dispense Refill  ? apixaban (ELIQUIS) 5 MG TABS tablet Take 1 tablet (5 mg total) by mouth in the morning and at bedtime. 28 tablet 0  ? aspirin EC 81 MG EC tablet Take 1 tablet (81 mg total) by mouth daily. Swallow whole. 30 tablet 11  ? empagliflozin (JARDIANCE) 25 MG TABS tablet Take by mouth daily.    ? ezetimibe (ZETIA) 10 MG tablet TAKE 1 TABLET BY MOUTH EVERY DAY 90 tablet 2  ? losartan (COZAAR) 25 MG tablet Take 25 mg by mouth daily.    ? metFORMIN (GLUCOPHAGE) 1000 MG tablet Take 1,000 mg by mouth 2 (two) times daily.  4  ? metoprolol succinate (TOPROL XL) 25 MG 24 hr tablet Take 1 tablet (25 mg total) by mouth daily. 90 tablet 3  ? omeprazole (PRILOSEC) 20 MG capsule Take 20 mg by mouth daily.  3  ? rosuvastatin (CRESTOR) 20 MG tablet Take 1 tablet (20 mg total) by mouth daily. 90 tablet 3  ? tamsulosin (FLOMAX) 0.4 MG CAPS capsule Take 0.4 mg by mouth.    ? TRADJENTA 5 MG TABS tablet Take 5 mg by mouth daily.    ? Vitamin D, Cholecalciferol, 25 MCG (1000 UT) TABS Take 1,000 Units by mouth daily.    ? cetirizine (ZYRTEC) 10 MG tablet Take 10 mg by mouth daily. (Patient not taking: Reported on 04/16/2022)    ? dapagliflozin propanediol (FARXIGA) 10 MG TABS tablet Take 1 tablet (10 mg total) by mouth daily before breakfast. (Patient not taking: Reported on 03/21/2022) 30 tablet 3  ? doxazosin (CARDURA) 2 MG tablet Take 1 tablet (2 mg total) by mouth daily. (Patient not taking: Reported on 04/16/2022) 90 tablet 3  ? ?No facility-administered medications prior to visit.  ?  ? ?Allergies:    Atorvastatin  ? ?Social History  ? ?Socioeconomic History  ? Marital status: Divorced  ?  Spouse name: Not on file  ? Number of children: Not on file  ? Years of education: Not on file  ? Highest education level: Not on file  ?Occupational History  ? Not on file  ?Tobacco Use  ? Smoking status: Former  ? Smokeless tobacco: Never  ? Tobacco comments:  ?  quit over 30 years ago  ?Vaping Use  ? Vaping Use: Never used  ?Substance and Sexual Activity  ? Alcohol use: No  ? Drug use: Not Currently  ? Sexual activity: Not on file  ?Other Topics Concern  ? Not on file  ?Social History Narrative  ? Not on file  ? ?Social Determinants of Health  ? ?Financial Resource Strain:  Not on file  ?Food Insecurity: Not on file  ?Transportation Needs: Not on file  ?Physical Activity: Not on file  ?Stress: Not on file  ?Social Connections: Not on file  ?  ? ?Family History:  The patient's family history includes Heart attack in his brother, father, mother, and sister.  ? ?Review of Systems:   ? ?Please see the history of present illness.    ? ?All other systems reviewed and are otherwise negative except as noted above. ? ? ?Physical Exam:   ? ?VS:  BP 132/66   Pulse (!) 107   Wt 208 lb (94.3 kg)   SpO2 98%   BMI 28.21 kg/m?    ?General: Well developed, well nourished,male appearing in no acute distress. ?Head: Normocephalic, atraumatic. ?Neck: No carotid bruits. JVD not elevated.  ?Lungs: Respirations regular and unlabored, without wheezes or rales.  ?Heart: Irregularly irregular. No S3 or S4.  No murmur, no rubs, or gallops appreciated. ?Abdomen: Appears non-distended. No obvious abdominal masses. ?Msk:  Strength and tone appear normal for age. No obvious joint deformities or effusions. ?Extremities: No clubbing or cyanosis. No pitting edema.  Distal pedal pulses are 2+ bilaterally. ?Neuro: Alert and oriented X 3. Moves all extremities spontaneously. No focal deficits noted. ?Psych:  Responds to questions appropriately with a normal  affect. ?Skin: No rashes or lesions noted ? ?Wt Readings from Last 3 Encounters:  ?04/16/22 208 lb (94.3 kg)  ?03/27/22 207 lb (93.9 kg)  ?03/25/22 207 lb (93.9 kg)  ?  ? ?Studies/Labs Reviewed:  ? ?EKG:  EKG

## 2022-04-16 NOTE — Patient Instructions (Addendum)
Medication Instructions:  ? ?START Amiodarone 400 mg Two Times Daily for 1 Week, Then Decrease to 400 mg Daily for 2 Weeks, Then reduce to 200 mg Daily.  ? ? ?Can use Hydrocortisone as needed along your chest where your cardioversion was performed.  ? ?*If you need a refill on your cardiac medications before your next appointment, please call your pharmacy* ? ? ?Lab Work: ?NONE  ? ?If you have labs (blood work) drawn today and your tests are completely normal, you will receive your results only by: ?MyChart Message (if you have MyChart) OR ?A paper copy in the mail ?If you have any lab test that is abnormal or we need to change your treatment, we will call you to review the results. ? ? ?Testing/Procedures: ?NONE  ? ? ?Follow-Up: ?At [facility], you and your health needs are our priority.  As part of our continuing mission to provide you with exceptional heart care, we have created designated Provider Care Teams.  These Care Teams include your primary Cardiologist (physician) and Advanced Practice Providers (APPs -  Physician Assistants and Nurse Practitioners) who all work together to provide you with the care you need, when you need it. ? ?We recommend signing up for the patient portal called "MyChart".  Sign up information is provided on this After Visit Summary.  MyChart is used to connect with patients for Virtual Visits (Telemedicine).  Patients are able to view lab/test results, encounter notes, upcoming appointments, etc.  Non-urgent messages can be sent to your provider as well.   ?To learn more about what you can do with MyChart, go to [email or web address].   ? ?Your next appointment:   ?2 month(s) ? ?The format for your next appointment:   ?In Person ? ?Provider:   ?You may see Armanda Magic, MD or one of the following Advanced Practice Providers on your designated Care Team:   ?Randall An, PA-C  ?Jacolyn Reedy, PA-C   ? ? ?Other Instructions ?Your physician recommends that you schedule a  follow-up appointment in: 3 Weeks for an EKG.  ? ? ?Important Information About Sugar ? ? ? ? ? ? ?

## 2022-05-08 ENCOUNTER — Ambulatory Visit (INDEPENDENT_AMBULATORY_CARE_PROVIDER_SITE_OTHER): Payer: Medicare Other

## 2022-05-08 VITALS — BP 150/68 | Ht 72.0 in | Wt 212.0 lb

## 2022-05-08 DIAGNOSIS — I480 Paroxysmal atrial fibrillation: Secondary | ICD-10-CM

## 2022-05-08 MED ORDER — AMIODARONE HCL 200 MG PO TABS: 200.0000 mg | ORAL_TABLET | Freq: Every day | ORAL | 3 refills | Status: DC

## 2022-05-08 NOTE — Progress Notes (Signed)
Pt denies cp, nausea, palpitations, dizziness.  ?Pt stated that he feels better on Amiodarone.  ?Pt stated that before starting amiodarone he felt heart flutters, but does not feel them now.  ? ?

## 2022-05-08 NOTE — Patient Instructions (Signed)
Medication Instructions:  ?Decrease Amiodarone to 200 mg daily ?Continue Eliquis and Toprol-XL ? ?Labwork: ?None ? ?Testing/Procedures: ?None ? ?Any Other Special Instructions Will Be Listed Below (If Applicable). ? ? ? ? ?If you need a refill on your cardiac medications before your next appointment, please call your pharmacy. ? ?

## 2022-07-04 NOTE — Progress Notes (Signed)
Cardiology Office Note:    Date:  07/05/2022   ID:  Herbert Torres 11/12/1946, MRN [id number]  PCP:  [facility], [facility] Providers Cardiologist:  Armanda Magic, MD     Referring MD: Center, [facility]*   Chief Complaint  Patient presents with   Atrial Fibrillation   Coronary Artery Disease   Hypertension   Hyperlipidemia     History of Present Illness:    Herbert Torres is a 76 y.o. male with a hx of coronary artery disease, diabetes mellitus, GERD, hyperlipidemia, and hypertension.  He also has a history of CAD status post NSTEMI in 2021. Cath revealed multi-vessel CAD. He underwent CABG x4 and left atrial clipping on 10/18/20. His hospitalization was complicated by post-op atrial fibrillation. He converted to sinus rhythm on amiodarone.  He went back into atrial fibrillation and had a cardioversion 10/28/20.   He is here today for followup and is doing well.  He denies any chest pain or pressure, SOB, DOE, PND, orthopnea, LE edema, dizziness or syncope. He is noticing in the morning that he has palpitations that last about 10 minutes and then subside.  He drinks 3 cup of caffienated coffee every am but no ETOH.  He is compliant with his meds and is tolerating meds with no SE.     Past Medical History:  Diagnosis Date   Coronary artery disease    a. s/p CABG x 4 with LIMA-LAD, rSVG-D1, rSVG-OM1 and rSVG-OM3 and LAA clipping in 09/2020   Diabetes mellitus without complication (HCC)    GERD (gastroesophageal reflux disease)    HOH (hard of hearing)    AIDS   Hyperlipidemia    Hypertension    PAF (paroxysmal atrial fibrillation) (HCC)     Past Surgical History:  Procedure Laterality Date   APPENDECTOMY     CARDIOVERSION N/A 10/27/2020   Procedure: CARDIOVERSION;  Surgeon: Lewayne Bunting, MD;  Location: [facility] ENDOSCOPY;  Service: Cardiovascular;  Laterality: N/A;   CARDIOVERSION N/A 03/27/2022   Procedure: CARDIOVERSION;  Surgeon: Antoine Poche, MD;  Location: [facility] ORS;  Service: Endoscopy;  Laterality: N/A;   CATARACT EXTRACTION W/PHACO Right 02/26/2018   Procedure: CATARACT EXTRACTION PHACO AND INTRAOCULAR LENS PLACEMENT (IOC);  Surgeon: Nevada Crane, MD;  Location: [facility] ORS;  Service: Ophthalmology;  Laterality: Right;  fluid pak loT# [id number] H  exp10/31/2020 [place]   00:38.9 AP%   16.3 CDE   6.33   CLIPPING OF ATRIAL APPENDAGE Left 10/18/2020   Procedure: CLIPPING OF LEFT ATRIAL APPENDAGE USING ATRICURE ATRICLIP SIZE 40;  Surgeon: Corliss Skains, MD;  Location: [facility] OR;  Service: Open Heart Surgery;  Laterality: Left;   COLONOSCOPY     CORONARY ARTERY BYPASS GRAFT N/A 10/18/2020   Procedure: CORONARY ARTERY BYPASS GRAFTING (CABG) TIMES FOUR USING LEFT INTERNAL MAMMARY ARTERY AND RIGHT LEG GREATER SAPHENOUS VEIN HARVESTED ENDOSCOPICALLY Flowtrack only;  Surgeon: Corliss Skains, MD;  Location: [facility] OR;  Service: Open Heart Surgery;  Laterality: N/A;   IR THORACENTESIS ASP PLEURAL SPACE W/IMG GUIDE  10/23/2020   LEFT HEART CATH AND CORONARY ANGIOGRAPHY N/A 10/16/2020   Procedure: LEFT HEART CATH AND CORONARY ANGIOGRAPHY;  Surgeon: Marykay Lex, MD;  Location: [facility] INVASIVE CV LAB;  Service: Cardiovascular;  Laterality: N/A;   TEE WITHOUT CARDIOVERSION N/A 10/18/2020   Procedure: TRANSESOPHAGEAL ECHOCARDIOGRAM (TEE);  Surgeon: Corliss Skains, MD;  Location: [facility] OR;  Service: Open Heart Surgery;  Laterality: N/A;  Current Medications: Current Meds  Medication Sig   amiodarone (PACERONE) 200 MG tablet Take 1 tablet (200 mg total) by mouth daily.   apixaban (ELIQUIS) 5 MG TABS tablet Take 1 tablet (5 mg total) by mouth in the morning and at bedtime.   aspirin EC 81 MG EC tablet Take 1 tablet (81 mg total) by mouth daily. Swallow whole.   cetirizine (ZYRTEC) 10 MG tablet Take 10 mg by mouth daily.   doxazosin (CARDURA) 2 MG tablet Take 1 tablet (2 mg total) by mouth daily.   empagliflozin (JARDIANCE) 25 MG TABS  tablet Take by mouth daily.   ezetimibe (ZETIA) 10 MG tablet TAKE 1 TABLET BY MOUTH EVERY DAY   losartan (COZAAR) 25 MG tablet Take 25 mg by mouth daily.   metFORMIN (GLUCOPHAGE) 1000 MG tablet Take 1,000 mg by mouth 2 (two) times daily.   metoprolol succinate (TOPROL XL) 25 MG 24 hr tablet Take 1 tablet (25 mg total) by mouth daily.   omeprazole (PRILOSEC) 20 MG capsule Take 20 mg by mouth daily.   rosuvastatin (CRESTOR) 20 MG tablet Take 1 tablet (20 mg total) by mouth daily.   tamsulosin (FLOMAX) 0.4 MG CAPS capsule Take 0.4 mg by mouth.   TRADJENTA 5 MG TABS tablet Take 5 mg by mouth daily.   Vitamin D, Cholecalciferol, 25 MCG (1000 UT) TABS Take 1,000 Units by mouth daily.     Allergies:   Atorvastatin   Social History   Socioeconomic History   Marital status: Divorced    Spouse name: Not on file   Number of children: Not on file   Years of education: Not on file   Highest education level: Not on file  Occupational History   Not on file  Tobacco Use   Smoking status: Former   Smokeless tobacco: Never   Tobacco comments:    quit over 30 years ago  Vaping Use   Vaping Use: Never used  Substance and Sexual Activity   Alcohol use: No   Drug use: Not Currently   Sexual activity: Not on file  Other Topics Concern   Not on file  Social History Narrative   Not on file   Social Determinants of Health   Financial Resource Strain: Not on file  Food Insecurity: Not on file  Transportation Needs: Not on file  Physical Activity: Not on file  Stress: Not on file  Social Connections: Not on file     Family History: The patient's family history includes Heart attack in his brother, father, mother, and sister.  ROS:   Please see the history of present illness.    (+) Urinary frequency All other systems reviewed and are negative.  EKGs/Labs/Other Studies Reviewed:    The following studies were reviewed today:  Intraoperative TEE 10/18/2020: POST-OP IMPRESSIONS  -  Aorta: The aorta appears unchanged from pre-bypass.  - Aortic Valve: The aortic valve appears unchanged from pre-bypass.  - Mitral Valve: The mitral valve appears unchanged from pre-bypass.  - Tricuspid Valve: The tricuspid valve appears unchanged from pre-bypass.  - Interatrial Septum: The interatrial septum appears unchanged from  pre-bypass.   Echo 10/16/2020:  1. Left ventricular ejection fraction, by estimation, is 50 to 55%. The  left ventricle has low normal function. The left ventricle demonstrates  regional wall motion abnormalities (see scoring diagram/findings for  description). Left ventricular diastolic   parameters are consistent with Grade I diastolic dysfunction (impaired  relaxation).   2. Right ventricular systolic function is  normal. The right ventricular  size is mildly enlarged.   3. Left atrial size was mild to moderately dilated.   4. The mitral valve is grossly normal. No evidence of mitral valve  regurgitation.   5. The aortic valve was not well visualized. Aortic valve regurgitation  is not visualized.   6. The inferior vena cava is normal in size with <50% respiratory  variability, suggesting right atrial pressure of 8 mmHg.   Comparison(s): No prior Echocardiogram.   LHC 10/16/2020: Mid RCA to Dist RCA lesion is 100% stenosed. RV Branch lesion is 90% stenosed. --------------------------- Prox LAD to Mid LAD lesion is 80% stenosed with 70% stenosed side branch in 1st Diag & 1st Sept lesion is 70% stenosed. Mid LAD lesion is 75% stenosed. --------------------------- Prox Cx lesion is 75% stenosed. Lat 3rd Mrg lesion is 80% stenosed. Diminutive 4th Mrg lesion is 90% stenosed. Dist Cx / OM lesion is 90% stenosed. =================== There is no aortic valve stenosis. LV end diastolic pressure is normal. The left ventricular systolic function is normal. There is trivial (1+) mitral regurgitation.   SUMMARY Severe multivessel CAD:  Possible subacute  occlusion or CTO of RCA after RV marginal branch that has an 90% stenosis, with minimal left to right collaterals;  Focal eccentric calcified 80% stenosis of the LAD at the takeoff of SP1 (1st Sept) and D1 (1st Diag) along with eccentric 70% mid LAD, and  75% proximal major LCx that has diffuse areas of relatively severe stenosis in the sidebranches. Relatively normal EF with mild inferior hypokinesis.  Relatively normal LVEDP.     RECOMMENDATIONS With diabetes and existing A. fib on Eliquis already, best option for this gentleman is CVTS consult for CABG. Continue aggressive risk factor modification.   Results reviewed with Dr. Martinique.  CVTS consult called  EKG:   EKG was performed today and demonstrates NSR with PACs and QTc 456ms  Recent Labs: 09/04/2021: TSH 2.530 03/21/2022: ALT 9; Hemoglobin 12.1; Platelets 276 03/25/2022: BUN 11; Creatinine, Ser 0.86; Potassium 3.9; Sodium 139  Recent Lipid Panel    Component Value Date/Time   CHOL 104 03/21/2022 1029   CHOL 110 09/04/2021 0843   TRIG 161 (H) 03/21/2022 1029   HDL 37 (L) 03/21/2022 1029   HDL 42 09/04/2021 0843   CHOLHDL 2.8 03/21/2022 1029   VLDL 32 03/21/2022 1029   LDLCALC 35 03/21/2022 1029   LDLCALC 44 09/04/2021 0843     Risk Assessment/Calculations:    CHA2DS2-VASc Score = 5   This indicates a 7.2% annual risk of stroke. The patient's score is based upon: CHF History: 0 HTN History: 1 Diabetes History: 1 Stroke History: 0 Vascular Disease History: 1 Age Score: 2 Gender Score: 0        Physical Exam:    Wt Readings from Last 3 Encounters:  07/05/22 215 lb 9.6 oz (97.8 kg)  05/08/22 212 lb (96.2 kg)  04/16/22 208 lb (94.3 kg)    VS:  BP 140/60   Pulse 68   Ht 6' (1.829 m)   Wt 215 lb 9.6 oz (97.8 kg)   SpO2 98%   BMI 29.24 kg/m  , BMI Body mass index is 29.24 kg/m. GEN: Well nourished, well developed in no acute distress HEENT: Normal NECK: No JVD; No carotid bruits LYMPHATICS: No  lymphadenopathy CARDIAC:irregularly irregular, no murmurs, rubs, gallops RESPIRATORY:  Clear to auscultation without rales, wheezing or rhonchi  ABDOMEN: Soft, non-tender, non-distended MUSCULOSKELETAL:  No edema; No deformity  SKIN: Warm  and dry NEUROLOGIC:  Alert and oriented x 3 PSYCHIATRIC:  Normal affect    ASSESSMENT:    1. Paroxysmal atrial fibrillation (HCC)   2. Essential hypertension   3. Coronary artery disease involving native coronary artery of native heart without angina pectoris   4. Pure hypercholesterolemia      PLAN:    Paroxysmal atrial fibrillation -He had a recurrence of atrial fibrillation underwent DCCV on 03/27/2022 to sinus rhythm -He was seen back in April by Randall An after his cardioversion and was in atrial flutter and he was started on amiodarone 400 mg twice daily for 1 week followed by 40 mg daily and then 200 mg daily maintenance. -He is back today and is complaining that he is still having problems with palpitations in the am for about 10 minutes>>he admits to drinking 3 cups of coffee every am -He denies any bleeding issues on DOAC -Continue prescription drug management with apixaban 5 mg twice daily, Toprol-XL 25 mg daily and amiodarone 200 mg daily check with as needed refills -I have personally reviewed and interpreted outside labs performed by patient's PCP which showed ALT 9 on 03/21/2022 and TSH 2.53 on 01/04/2021 -get baseline PFTs with DLCO -I encouraged him to get a yearly ophthalmology exam due to amiodarone -I will get a 2 week Ziopatch to assess PAF load -encouraged him to cut out caffiene  2.  Hypertension -BP is adequately controlled on exam today -Continue prescription drug management with Toprol-XL 25 mg daily and losartan 25 mg daily with as needed refills -I have personally reviewed and interpreted outside labs performed by patient's PCP which showed serum creatinine 0.86 and potassium 3.9 on 03/25/2022  3.  ASCAD -Status  post NSTEMI in 2021 with cath showing multivessel CAD status post CABG -He has chronic shortness of breath but improved after cardioversion -He has not had any anginal symptoms -Continue prescription drug management with aspirin 81 mg daily, Toprol-XL 25 mg daily and statin therapy with as needed refills  4.  Hyperlipidemia -LDL goal less than 50 given his diabetes, hypertension and CAD -I have personally reviewed and interpreted outside labs performed by patient's PCP which showed total cholesterol 104, HDL 37, LDL 35, triglycerides 161 on 03/21/2022 -Continue prescription drug management with Crestor 20 mg daily and Zetia 10 mg daily with as needed refills-  Disposition: Follow-up with me in 1 year  Medication Adjustments/Labs and Tests Ordered: Current medicines are reviewed at length with the patient today.  Concerns regarding medicines are outlined above.   No orders of the defined types were placed in this encounter.   No orders of the defined types were placed in this encounter.   Signed, Armanda Magic, MD  07/05/2022 1:20 PM    [facility]

## 2022-07-05 ENCOUNTER — Ambulatory Visit: Payer: Medicare Other

## 2022-07-05 ENCOUNTER — Other Ambulatory Visit: Payer: Self-pay | Admitting: Cardiology

## 2022-07-05 ENCOUNTER — Ambulatory Visit: Payer: Medicare Other | Admitting: Cardiology

## 2022-07-05 ENCOUNTER — Encounter: Payer: Self-pay | Admitting: Cardiology

## 2022-07-05 VITALS — BP 140/60 | HR 68 | Ht 72.0 in | Wt 215.6 lb

## 2022-07-05 DIAGNOSIS — E7800 Pure hypercholesterolemia, unspecified: Secondary | ICD-10-CM

## 2022-07-05 DIAGNOSIS — I480 Paroxysmal atrial fibrillation: Secondary | ICD-10-CM

## 2022-07-05 DIAGNOSIS — I2510 Atherosclerotic heart disease of native coronary artery without angina pectoris: Secondary | ICD-10-CM | POA: Diagnosis not present

## 2022-07-05 DIAGNOSIS — I10 Essential (primary) hypertension: Secondary | ICD-10-CM

## 2022-07-05 DIAGNOSIS — I4891 Unspecified atrial fibrillation: Secondary | ICD-10-CM

## 2022-07-05 NOTE — Patient Instructions (Addendum)
Medication Instructions:  Your physician recommends that you continue on your current medications as directed. Please refer to the Current Medication list given to you today.  Labwork: none  Testing/Procedures: Your physician has recommended that you have a pulmonary function test. Pulmonary Function Tests are a group of tests that measure how well air moves in and out of your lungs. Your physician has recommended that you wear a Zio monitor.   This monitor is a medical device that records the heart's electrical activity. Doctors most often use these monitors to diagnose arrhythmias. Arrhythmias are problems with the speed or rhythm of the heartbeat. The monitor is a small device applied to your chest. You can wear one while you do your normal daily activities. While wearing this monitor if you have any symptoms to push the button and record what you felt. Once you have worn this monitor for the period of time provider prescribed (for 14 days), you will return the monitor device in the postage paid box. Once it is returned they will download the data collected and provide [place] with a report which the provider will then review and we will call you with those results. Important tips:  Avoid showering during the first 24 hours of wearing the monitor. Avoid excessive sweating to help maximize wear time. Do not submerge the device, no hot tubs, and no swimming pools. Keep any lotions or oils away from the patch. After 24 hours you may shower with the patch on. Take brief showers with your back facing the shower head.  Do not remove patch once it has been placed because that will interrupt data and decrease adhesive wear time. Push the button when you have any symptoms and write down what you were feeling. Once you have completed wearing your monitor, remove and place into box which has postage paid and place in your outgoing mailbox.  If for some reason you have misplaced your box then call our office  and we can provide another box and/or mail it off for you.  Follow-Up: Your physician recommends that you schedule a follow-up appointment in: 1 year. You will receive a reminder call in the mail in about 10 months reminding you to call and schedule your appointment. If you don't receive this call, please contact our office.  Any Other Special Instructions Will Be Listed Below (If Applicable).  If you need a refill on your cardiac medications before your next appointment, please call your pharmacy.

## 2022-07-05 NOTE — Addendum Note (Signed)
Addended by: Eustace Moore on: 07/05/2022 01:57 PM   Modules accepted: Orders

## 2022-07-08 ENCOUNTER — Other Ambulatory Visit: Payer: Self-pay | Admitting: Cardiology

## 2022-07-08 ENCOUNTER — Ambulatory Visit (INDEPENDENT_AMBULATORY_CARE_PROVIDER_SITE_OTHER): Payer: Medicare Other

## 2022-07-08 DIAGNOSIS — I4891 Unspecified atrial fibrillation: Secondary | ICD-10-CM

## 2022-07-10 DIAGNOSIS — I4891 Unspecified atrial fibrillation: Secondary | ICD-10-CM | POA: Diagnosis not present

## 2022-07-17 ENCOUNTER — Telehealth: Payer: Self-pay | Admitting: Cardiology

## 2022-07-17 NOTE — Telephone Encounter (Signed)
Clinical pharmacist to review Eliquis 

## 2022-07-17 NOTE — Telephone Encounter (Signed)
   Patient Name: DEAGEN KRASS  DOB: 1946/03/11 MRN: [id number]  Primary Cardiologist: Armanda Magic, MD  Chart reviewed as part of pre-operative protocol coverage. Given past medical history and time since last visit, based on ACC/AHA guidelines, SHI BLANKENSHIP would be at acceptable risk for the planned procedure without further cardiovascular testing.   Per Dr. Mayford Knife, patient can proceed with surgery, no need to wait for the heart monitor result.  I will route this recommendation to the requesting party via Epic fax function and remove from pre-op pool.  Please call with questions.  [place], [place] 07/17/2022, 10:06 PM

## 2022-07-17 NOTE — Telephone Encounter (Signed)
Dr. Mayford Knife to review.  Patient has a history of CAD status post CABG x4 and a left atrial appendage clipping 10/18/2020, hyperlipidemia, DM2, and hypertension.  He has not had any intervention since bypass surgery in 2021.  Patient was recently seen by Dr. Mayford Knife on 07/05/2022 at which time he complained of palpitation.  Heart monitor is currently pending.  Dr. Mayford Knife, does the patient need the heart monitor result before final clearance?  Overall low risks carpal tunnel release surgery and right ring finger surgery.  Please forward your response to P [facility] PREOP

## 2022-07-17 NOTE — Telephone Encounter (Signed)
   Pre-operative Risk Assessment    Patient Name: Herbert Torres  DOB: 06-19-1946 MRN: [id number]    Request for Surgical Clearance    Procedure:  Carpal tunnel release, left ring   Date of Surgery:  Clearance 08/16/22                                 Surgeon:    Dr. Audery Amel Group or Practice Name:  [facility] Phone number:  [phone] Fax number:     Type of Clearance Requested:   - Medical    Type of Anesthesia:  Regional   Additional requests/questions:    Caller stated she will be faxing request to hold Eliquis.   Signed, Annetta Maw   07/17/2022, 9:35 AM

## 2022-07-17 NOTE — Telephone Encounter (Signed)
Callback pool, please inform the patient he has been cleared for upcoming surgery.    Please also forward the cardiac clearance letter to the surgeon's office.  No fax number was documented.

## 2022-07-18 NOTE — Telephone Encounter (Signed)
Patient with diagnosis of afib on Eliquis for anticoagulation.    Procedure: left ring carpal tunnel release Date of procedure: 08/16/22  CHA2DS2-VASc Score = 5  This indicates a 7.2% annual risk of stroke. The patient's score is based upon: CHF History: 0 HTN History: 1 Diabetes History: 1 Stroke History: 0 Vascular Disease History: 1 Age Score: 2 Gender Score: 0  Underwent DCCV 03/27/22  CrCl 67mL/min Platelet count 276K  Per office protocol, patient can hold Eliquis for 1-2 days prior to procedure.    **This guidance is not considered finalized until pre-operative APP has relayed final recommendations.**

## 2022-07-18 NOTE — Telephone Encounter (Signed)
Left a message for the requesting surgeon's office, Lanora Manis, to call back and provide the fax # to send clearance.

## 2022-07-19 NOTE — Telephone Encounter (Signed)
Received fax with fax# [phone], clearance faxed.

## 2022-08-08 ENCOUNTER — Telehealth: Payer: Self-pay

## 2022-08-08 DIAGNOSIS — I4819 Other persistent atrial fibrillation: Secondary | ICD-10-CM

## 2022-08-08 MED ORDER — AMIODARONE HCL 200 MG PO TABS: 200.0000 mg | ORAL_TABLET | Freq: Every day | ORAL | 3 refills | Status: DC

## 2022-08-08 MED ORDER — METOPROLOL SUCCINATE ER 50 MG PO TB24: 50.0000 mg | ORAL_TABLET | Freq: Every day | ORAL | 3 refills | Status: DC

## 2022-08-08 NOTE — Telephone Encounter (Signed)
Patient is still having a significant amount of atrial fibrillation with RVR with A-fib burden of 10% and  heart rate gets up to as high as 160 bpm.  Please increase Toprol-XL to 50 mg daily and I would like him seen in A-fib clinic next week.    I spoke with daughter and she will have her father increase toprol to 50 mg daily. She will await call from a-fib clinic.

## 2022-08-15 ENCOUNTER — Ambulatory Visit (HOSPITAL_COMMUNITY)
Admission: RE | Admit: 2022-08-15 | Discharge: 2022-08-15 | Disposition: A | Discharge status: Home / Self Care | Payer: Medicare Other | Source: Ambulatory Visit | Attending: Nurse Practitioner | Admitting: Nurse Practitioner

## 2022-08-15 ENCOUNTER — Encounter (HOSPITAL_COMMUNITY): Payer: Self-pay | Admitting: Nurse Practitioner

## 2022-08-15 VITALS — BP 168/52 | HR 65 | Ht 72.0 in | Wt 218.4 lb

## 2022-08-15 DIAGNOSIS — I10 Essential (primary) hypertension: Secondary | ICD-10-CM | POA: Insufficient documentation

## 2022-08-15 DIAGNOSIS — I252 Old myocardial infarction: Secondary | ICD-10-CM | POA: Insufficient documentation

## 2022-08-15 DIAGNOSIS — E785 Hyperlipidemia, unspecified: Secondary | ICD-10-CM | POA: Insufficient documentation

## 2022-08-15 DIAGNOSIS — I2510 Atherosclerotic heart disease of native coronary artery without angina pectoris: Secondary | ICD-10-CM | POA: Insufficient documentation

## 2022-08-15 DIAGNOSIS — K219 Gastro-esophageal reflux disease without esophagitis: Secondary | ICD-10-CM | POA: Diagnosis not present

## 2022-08-15 DIAGNOSIS — I480 Paroxysmal atrial fibrillation: Secondary | ICD-10-CM | POA: Insufficient documentation

## 2022-08-15 DIAGNOSIS — Z7982 Long term (current) use of aspirin: Secondary | ICD-10-CM | POA: Insufficient documentation

## 2022-08-15 DIAGNOSIS — Z7901 Long term (current) use of anticoagulants: Secondary | ICD-10-CM | POA: Diagnosis not present

## 2022-08-15 DIAGNOSIS — D6869 Other thrombophilia: Secondary | ICD-10-CM

## 2022-08-15 DIAGNOSIS — Z951 Presence of aortocoronary bypass graft: Secondary | ICD-10-CM | POA: Diagnosis not present

## 2022-08-15 DIAGNOSIS — E119 Type 2 diabetes mellitus without complications: Secondary | ICD-10-CM | POA: Insufficient documentation

## 2022-08-15 DIAGNOSIS — Z79899 Other long term (current) drug therapy: Secondary | ICD-10-CM | POA: Diagnosis not present

## 2022-08-15 NOTE — Patient Instructions (Signed)
After your procedure if palpations become bothersome increase metoprolol to 1 and 1/2 tablets daily (75mg ) - will need follow up with [place] office if you increase the metoprolol

## 2022-08-15 NOTE — Progress Notes (Addendum)
Primary Care Physician: [facility] Referring Physician: Dr. Marylynn Pearson Herbert Torres is a 76 y.o. male with a h/o  coronary artery disease, diabetes mellitus, GERD, hyperlipidemia, and hypertension.  He also has a history of CAD status post NSTEMI in 2021. Cath revealed multi-vessel CAD. He underwent CABG x 4 and left atrial clipping on 10/18/20. His hospitalization was complicated by post-op atrial fibrillation. He converted to sinus rhythm on amiodarone.  He went back into atrial fibrillation and had a cardioversion 10/28/20.   He was recently seen by Dr. Radford Pax and c/o of palpitations. He wore a monitor that showed SR with 105 a fib burden with longest episode of afib 11 hours 40 mins with average HR 98 bpm, highest 160 but that episode lasts only 10 seconds. Dr. Radford Pax increased Toprol to 50 mg daily. He is now here for f/u. He is pending carpal tunnel surgery soon. He continues on amiodarone 200 mg daily and eliquis 5 mg bid for a CHA2DS2VASc of at least 5.   Today, he denies symptoms of palpitations, chest pain, shortness of breath, orthopnea, PND, lower extremity edema, dizziness, presyncope, syncope, or neurologic sequela. The patient is tolerating medications without difficulties and is otherwise without complaint today.   Past Medical History:  Diagnosis Date   Coronary artery disease    a. s/p CABG x 4 with LIMA-LAD, rSVG-D1, rSVG-OM1 and rSVG-OM3 and LAA clipping in 09/2020   Diabetes mellitus without complication (HCC)    GERD (gastroesophageal reflux disease)    HOH (hard of hearing)    AIDS   Hyperlipidemia    Hypertension    PAF (paroxysmal atrial fibrillation) ([place])    Past Surgical History:  Procedure Laterality Date   APPENDECTOMY     CARDIOVERSION N/A 10/27/2020   Procedure: CARDIOVERSION;  Surgeon: Lelon Perla, MD;  Location: [place];  Service: Cardiovascular;  Laterality: N/A;   CARDIOVERSION N/A 03/27/2022   Procedure: CARDIOVERSION;   Surgeon: Arnoldo Lenis, MD;  Location: [facility];  Service: Endoscopy;  Laterality: N/A;   CATARACT EXTRACTION W/PHACO Right 02/26/2018   Procedure: CATARACT EXTRACTION PHACO AND INTRAOCULAR LENS PLACEMENT (IOC);  Surgeon: Eulogio Bear, MD;  Location: [facility];  Service: Ophthalmology;  Laterality: Right;  fluid pak loT# [id number] H  exp10/31/2020 [place]   00:38.9 AP%   16.3 CDE   6.33   CLIPPING OF ATRIAL APPENDAGE Left 10/18/2020   Procedure: CLIPPING OF LEFT ATRIAL APPENDAGE USING ATRICURE ATRICLIP SIZE 40;  Surgeon: Lajuana Matte, MD;  Location: [place];  Service: Open Heart Surgery;  Laterality: Left;   COLONOSCOPY     CORONARY ARTERY BYPASS GRAFT N/A 10/18/2020   Procedure: CORONARY ARTERY BYPASS GRAFTING (CABG) TIMES FOUR USING LEFT INTERNAL MAMMARY ARTERY AND RIGHT LEG GREATER SAPHENOUS VEIN HARVESTED ENDOSCOPICALLY Flowtrack only;  Surgeon: Lajuana Matte, MD;  Location: [place];  Service: Open Heart Surgery;  Laterality: N/A;   IR THORACENTESIS ASP PLEURAL SPACE W/IMG GUIDE  10/23/2020   LEFT HEART CATH AND CORONARY ANGIOGRAPHY N/A 10/16/2020   Procedure: LEFT HEART CATH AND CORONARY ANGIOGRAPHY;  Surgeon: Leonie Man, MD;  Location: [place] [facility];  Service: Cardiovascular;  Laterality: N/A;   TEE WITHOUT CARDIOVERSION N/A 10/18/2020   Procedure: TRANSESOPHAGEAL ECHOCARDIOGRAM (TEE);  Surgeon: Lajuana Matte, MD;  Location: [place];  Service: Open Heart Surgery;  Laterality: N/A;    Current Outpatient Medications  Medication Sig Dispense Refill   amiodarone (PACERONE) 200 MG tablet Take 1  tablet (200 mg total) by mouth daily. 90 tablet 3   apixaban (ELIQUIS) 5 MG TABS tablet Take 1 tablet (5 mg total) by mouth in the morning and at bedtime. 28 tablet 0   aspirin EC 81 MG EC tablet Take 1 tablet (81 mg total) by mouth daily. Swallow whole. 30 tablet 11   cetirizine (ZYRTEC) 10 MG tablet Take 10 mg by mouth daily.     doxazosin (CARDURA) 2 MG tablet Take 1 tablet  (2 mg total) by mouth daily. 90 tablet 3   empagliflozin (JARDIANCE) 25 MG TABS tablet Take by mouth daily.     ezetimibe (ZETIA) 10 MG tablet TAKE 1 TABLET BY MOUTH EVERY DAY 90 tablet 2   losartan (COZAAR) 25 MG tablet Take 25 mg by mouth daily.     metFORMIN (GLUCOPHAGE) 1000 MG tablet Take 1,000 mg by mouth 2 (two) times daily.  4   metoprolol succinate (TOPROL-XL) 50 MG 24 hr tablet Take 1 tablet (50 mg total) by mouth daily. Take with or immediately following a meal. 90 tablet 3   omeprazole (PRILOSEC) 20 MG capsule Take 20 mg by mouth daily.  3   rosuvastatin (CRESTOR) 20 MG tablet Take 1 tablet (20 mg total) by mouth daily. 90 tablet 3   tamsulosin (FLOMAX) 0.4 MG CAPS capsule Take 0.4 mg by mouth.     TRADJENTA 5 MG TABS tablet Take 5 mg by mouth daily.     Vitamin D, Cholecalciferol, 25 MCG (1000 UT) TABS Take 1,000 Units by mouth daily.     No current facility-administered medications for this encounter.    Allergies  Allergen Reactions   Atorvastatin Other (See Comments)    C/o myalgias in knees    Social History   Socioeconomic History   Marital status: Divorced    Spouse name: Not on file   Number of children: Not on file   Years of education: Not on file   Highest education level: Not on file  Occupational History   Not on file  Tobacco Use   Smoking status: Former   Smokeless tobacco: Never   Tobacco comments:    quit over 30 years ago  Vaping Use   Vaping Use: Never used  Substance and Sexual Activity   Alcohol use: No   Drug use: Not Currently   Sexual activity: Not on file  Other Topics Concern   Not on file  Social History Narrative   Not on file   Social Determinants of Health   Financial Resource Strain: Not on file  Food Insecurity: Not on file  Transportation Needs: Not on file  Physical Activity: Not on file  Stress: Not on file  Social Connections: Not on file  Intimate Partner Violence: Not on file    Family History  Problem  Relation Age of Onset   Heart attack Mother    Heart attack Father    Heart attack Sister    Heart attack Brother     ROS- All systems are reviewed and negative except as per the HPI above  Physical Exam: Vitals:   08/15/22 0817  Height: 6' (1.829 m)   Wt Readings from Last 3 Encounters:  07/05/22 97.8 kg  05/08/22 96.2 kg  04/16/22 94.3 kg    Labs: Lab Results  Component Value Date   NA 139 03/25/2022   K 3.9 03/25/2022   CL 107 03/25/2022   CO2 24 03/25/2022   GLUCOSE 118 (H) 03/25/2022   BUN 11  03/25/2022   CREATININE 0.86 03/25/2022   CALCIUM 8.6 (L) 03/25/2022   MG 1.9 10/19/2020   Lab Results  Component Value Date   INR 1.2 03/25/2022   Lab Results  Component Value Date   CHOL 104 03/21/2022   HDL 37 (L) 03/21/2022   LDLCALC 35 03/21/2022   TRIG 161 (H) 03/21/2022     GEN- The patient is well appearing, alert and oriented x 3 today.   Head- normocephalic, atraumatic Eyes-  Sclera clear, conjunctiva pink Ears- hearing intact Oropharynx- clear Neck- supple, no JVP Lymph- no cervical lymphadenopathy Lungs- Clear to ausculation bilaterally, normal work of breathing Heart- Regular rate and rhythm, no murmurs, rubs or gallops, PMI not laterally displaced GI- soft, NT, ND, + BS Extremities- no clubbing, cyanosis, or edema MS- no significant deformity or atrophy Skin- no rash or lesion Psych- euthymic mood, full affect Neuro- strength and sensation are intact  Zio patch-  Predominant rhythm was normal sinus rhythm with average heart rate 66 bpm and ranged from 50 to 109 bpm   Paroxysmal atrial fibrillation with RVR with A-fib burden 10% with heart rate ranging from 65 to 160 bpm.  The longest episode lasted 11 hours 40 minutes.  Atrial fibrillation was present at the activation of device.   Occasional PACs, bigeminal PACs, atrial couplets, atrial triplets   37 SVT episodes with the longest interval lasting 10.1 seconds and fastest interval 158 bpm    Rare PVCs, ventricular couplets and ventricular triplets were present.  PVC load less than 1%  EKG-Vent. rate 65 BPM PR interval 176 ms QRS duration 84 ms QT/QTcB 442/459 ms P-R-T axes 75 97 17 Normal sinus rhythm with sinus arrhythmia Rightward axis Nonspecific T wave abnormality Abnormal ECG When compared with ECG of 27-Mar-2022 10:47, PREVIOUS ECG IS PRESENT  Echo-1. Inferior basal hypokinesis . Left ventricular ejection fraction, by  estimation, is 55%. The left ventricle has normal function. The left  ventricle demonstrates regional wall motion abnormalities (see scoring  diagram/findings for description). There  is mild left ventricular hypertrophy. Left ventricular diastolic  parameters were normal.   2. Right ventricular systolic function is normal. The right ventricular  size is normal. There is normal pulmonary artery systolic pressure.   3. Left atrial size was moderately dilated.   4. Right atrial size was moderately dilated.   5. The mitral valve is abnormal. Trivial mitral valve regurgitation. No  evidence of mitral stenosis.   6. The aortic valve is tricuspid. There is moderate calcification of the  aortic valve. There is moderate thickening of the aortic valve. Aortic  valve regurgitation is trivial. Aortic valve sclerosis is present, with no  evidence of aortic valve stenosis.   7. The inferior vena cava is normal in size with greater than 50%  respiratory variability, suggesting right atrial pressure of 3 mmHg.   Assessment and Plan:  1. Atrial fibrillation  Dx at time of CABG in 2021 Recent monitor showed 10% burden, pt states that he will feel palpitations intermittently  but he is only aware of them a few minutes at a time He can do everything he wants to do and feels well  He was advised to increase toprol to 50 mg daily  by Dr. Theodosia Blender office but he did not do this as he is pending carpal tunnel surgery tomorrow If after surgery he is bothered with  palpitations he can increase torpol to 37.5 mg daily as he has a HR in the 60's and then  he requests to follow up in the [place] office if needed  He was reminded to take BB and amiodarone am of surgery to help stay in SR for the surgery  Continue amiodarone 200 mg daily  Discussed possibly of afib ablation if afib burden is not adequately controlled or pt desires to get off amiodarone, pt does not want to pursue at this time We also discussed tikosyn but I don't know if it would keep him in rhythm any better than amiodarone  2. CHA2DS2VASc  of 5 Eliquis 5 mg bid  He has been off eliquis since Tuesday for surgery Friday He should be able to restart the pm of surgery but surgeon will call depending on bleeding issus at time of surgery  He has not stopped the asa so I recommended to let the surgeon know   F/u in the [place] office going forward   Lupita Leash C. [facility] [address] [place], [place] [address] [phone]

## 2023-01-30 ENCOUNTER — Encounter (HOSPITAL_COMMUNITY): Payer: Self-pay | Admitting: *Deleted

## 2023-03-11 ENCOUNTER — Telehealth: Payer: Self-pay | Admitting: Cardiology

## 2023-03-11 ENCOUNTER — Encounter: Payer: Self-pay | Admitting: *Deleted

## 2023-03-11 NOTE — Telephone Encounter (Signed)
Replied via mychart.

## 2023-03-11 NOTE — Telephone Encounter (Signed)
Pt c/o medication issue:  1. Name of Medication:   metoprolol succinate (TOPROL-XL) 50 MG 24 hr tablet    2. How are you currently taking this medication (dosage and times per day)? Take 1 tablet (50 mg total) by mouth daily. Take with or immediately following a meal.   3. Are you having a reaction (difficulty breathing--STAT)? No  4. What is your medication issue? Pt's daughter would like a callback in regards to pt being seen by PCP who then advised to switched pt from 50mg  to 25mg . Pt's daughter is concerned and would like to discuss with nurse or provider before pt begins this change in medication. Please advise.

## 2023-08-06 ENCOUNTER — Encounter: Payer: Self-pay | Admitting: Internal Medicine

## 2023-08-06 ENCOUNTER — Ambulatory Visit: Payer: Medicare Other | Attending: Internal Medicine | Admitting: Internal Medicine

## 2023-08-06 VITALS — BP 140/70 | HR 56 | Ht 72.0 in | Wt 219.0 lb

## 2023-08-06 DIAGNOSIS — I480 Paroxysmal atrial fibrillation: Secondary | ICD-10-CM | POA: Diagnosis not present

## 2023-08-06 DIAGNOSIS — I351 Nonrheumatic aortic (valve) insufficiency: Secondary | ICD-10-CM

## 2023-08-06 DIAGNOSIS — I2510 Atherosclerotic heart disease of native coronary artery without angina pectoris: Secondary | ICD-10-CM | POA: Insufficient documentation

## 2023-08-06 MED ORDER — DILTIAZEM HCL 30 MG PO TABS: 30.0000 mg | ORAL_TABLET | Freq: Three times a day (TID) | ORAL | 3 refills | Status: AC | PRN

## 2023-08-06 NOTE — Patient Instructions (Signed)
Medication Instructions:  Your physician has recommended you make the following change in your medication:   - Stop Amiodarone  -Start Diltiazem 30 mg- take 1 tablet every 8 hours as needed for palpitations.    *If you need a refill on your cardiac medications before your next appointment, please call your pharmacy*   Lab Work: [place] If you have labs (blood work) drawn today and your tests are completely normal, you will receive your results only by: MyChart Message (if you have MyChart) OR A paper copy in the mail If you have any lab test that is abnormal or we need to change your treatment, we will call you to review the results.   Testing/Procedures: Itamar Sleep Study Device- Our office will call you when the device comes.    Follow-Up: At [facility], you and your health needs are our priority.  As part of our continuing mission to provide you with exceptional heart care, we have created designated Provider Care Teams.  These Care Teams include your primary Cardiologist (physician) and Advanced Practice Providers (APPs -  Physician Assistants and Nurse Practitioners) who all work together to provide you with the care you need, when you need it.  We recommend signing up for the patient portal called "MyChart".  Sign up information is provided on this After Visit Summary.  MyChart is used to connect with patients for Virtual Visits (Telemedicine).  Patients are able to view lab/test results, encounter notes, upcoming appointments, etc.  Non-urgent messages can be sent to your provider as well.   To learn more about what you can do with MyChart, go to [email or web address].    Your next appointment:   1 year(s)  Provider:   Luane School, MD    Other Instructions

## 2023-08-06 NOTE — Progress Notes (Signed)
Cardiology Office Note  Date: 08/06/2023   ID: Torres, Herbert 1946-03-14, MRN [id number]  PCP:  [facility]  Cardiologist:  Armanda Magic, MD Electrophysiologist:  None   History of Present Illness: Herbert Torres is a 77 y.o. male known to have CAD status post CABG in 2021, HLD, HTN, paroxysmal A-fib, DM 2 is here for follow-up visit.  No angina, DOE, orthopnea, PND, leg swelling, syncope.  Has dizziness when his blood pressure drops around 90/40 mmHg and this happens once a month.  He drinks a lot of water.  Never had syncope.  Denies having any palpitations in the last 1 year except when he exercises.  Past Medical History:  Diagnosis Date   Coronary artery disease    a. s/p CABG x 4 with LIMA-LAD, rSVG-D1, rSVG-OM1 and rSVG-OM3 and LAA clipping in 09/2020   Diabetes mellitus without complication (HCC)    GERD (gastroesophageal reflux disease)    HOH (hard of hearing)    AIDS   Hyperlipidemia    Hypertension    PAF (paroxysmal atrial fibrillation) (HCC)     Past Surgical History:  Procedure Laterality Date   APPENDECTOMY     CARDIOVERSION N/A 10/27/2020   Procedure: CARDIOVERSION;  Surgeon: Lewayne Bunting, MD;  Location: [facility] ENDOSCOPY;  Service: Cardiovascular;  Laterality: N/A;   CARDIOVERSION N/A 03/27/2022   Procedure: CARDIOVERSION;  Surgeon: Antoine Poche, MD;  Location: [facility] ORS;  Service: Endoscopy;  Laterality: N/A;   CATARACT EXTRACTION W/PHACO Right 02/26/2018   Procedure: CATARACT EXTRACTION PHACO AND INTRAOCULAR LENS PLACEMENT (IOC);  Surgeon: Nevada Crane, MD;  Location: [facility] ORS;  Service: Ophthalmology;  Laterality: Right;  fluid pak loT# [id number]  exp10/31/2020 [place]   00:38.9 AP%   16.3 CDE   6.33   CLIPPING OF ATRIAL APPENDAGE Left 10/18/2020   Procedure: CLIPPING OF LEFT ATRIAL APPENDAGE USING ATRICURE ATRICLIP SIZE 40;  Surgeon: Corliss Skains, MD;  Location: [facility] OR;  Service: Open Heart Surgery;  Laterality: Left;    COLONOSCOPY     CORONARY ARTERY BYPASS GRAFT N/A 10/18/2020   Procedure: CORONARY ARTERY BYPASS GRAFTING (CABG) TIMES FOUR USING LEFT INTERNAL MAMMARY ARTERY AND RIGHT LEG GREATER SAPHENOUS VEIN HARVESTED ENDOSCOPICALLY Flowtrack only;  Surgeon: Corliss Skains, MD;  Location: [facility] OR;  Service: Open Heart Surgery;  Laterality: N/A;   IR THORACENTESIS ASP PLEURAL SPACE W/IMG GUIDE  10/23/2020   LEFT HEART CATH AND CORONARY ANGIOGRAPHY N/A 10/16/2020   Procedure: LEFT HEART CATH AND CORONARY ANGIOGRAPHY;  Surgeon: Marykay Lex, MD;  Location: [facility] INVASIVE CV LAB;  Service: Cardiovascular;  Laterality: N/A;   TEE WITHOUT CARDIOVERSION N/A 10/18/2020   Procedure: TRANSESOPHAGEAL ECHOCARDIOGRAM (TEE);  Surgeon: Corliss Skains, MD;  Location: [facility] OR;  Service: Open Heart Surgery;  Laterality: N/A;    Current Outpatient Medications  Medication Sig Dispense Refill   amiodarone (PACERONE) 200 MG tablet Take 1 tablet (200 mg total) by mouth daily. 90 tablet 3   apixaban (ELIQUIS) 5 MG TABS tablet Take 1 tablet (5 mg total) by mouth in the morning and at bedtime. (Patient not taking: Reported on 08/15/2022) 28 tablet 0   aspirin EC 81 MG EC tablet Take 1 tablet (81 mg total) by mouth daily. Swallow whole. 30 tablet 11   cetirizine (ZYRTEC) 10 MG tablet Take 10 mg by mouth daily.     empagliflozin (JARDIANCE) 25 MG TABS tablet Take by mouth daily. (Patient not taking: Reported on 08/15/2022)  ezetimibe (ZETIA) 10 MG tablet TAKE 1 TABLET BY MOUTH EVERY DAY 90 tablet 2   losartan (COZAAR) 25 MG tablet Take 25 mg by mouth daily.     metFORMIN (GLUCOPHAGE) 1000 MG tablet Take 1,000 mg by mouth 2 (two) times daily. (Patient not taking: Reported on 08/15/2022)  4   metoprolol succinate (TOPROL-XL) 50 MG 24 hr tablet Take 1 tablet (50 mg total) by mouth daily. Take with or immediately following a meal. 90 tablet 3   omeprazole (PRILOSEC) 20 MG capsule Take 20 mg by mouth daily.  3   rosuvastatin  (CRESTOR) 20 MG tablet Take 1 tablet (20 mg total) by mouth daily. 90 tablet 3   tamsulosin (FLOMAX) 0.4 MG CAPS capsule Take 0.4 mg by mouth.     TRADJENTA 5 MG TABS tablet Take 5 mg by mouth daily.     No current facility-administered medications for this visit.   Allergies:  Atorvastatin   Social History: The patient  reports that he has quit smoking. He has never used smokeless tobacco. He reports that he does not currently use drugs. He reports that he does not drink alcohol.   Family History: The patient's family history includes Heart attack in his brother, father, mother, and sister.   ROS:  Please see the history of present illness. Otherwise, complete review of systems is positive for none.  All other systems are reviewed and negative.   Physical Exam: VS:  Ht 6' (1.829 m)   Wt 219 lb (99.3 kg)   BMI 29.70 kg/m , BMI Body mass index is 29.7 kg/m.  Wt Readings from Last 3 Encounters:  08/06/23 219 lb (99.3 kg)  08/15/22 218 lb 6.4 oz (99.1 kg)  07/05/22 215 lb 9.6 oz (97.8 kg)    General: Patient appears comfortable at rest. HEENT: Conjunctiva and lids normal, oropharynx clear with moist mucosa. Neck: Supple, no elevated JVP or carotid bruits, no thyromegaly. Lungs: Clear to auscultation, nonlabored breathing at rest. Cardiac: Regular rate and rhythm, no S3 or significant systolic murmur, no pericardial rub. Abdomen: Soft, nontender, no hepatomegaly, bowel sounds present, no guarding or rebound. Extremities: No pitting edema, distal pulses 2+. Skin: Warm and dry. Musculoskeletal: No kyphosis. Neuropsychiatric: Alert and oriented x3, affect grossly appropriate.  Recent Labwork: No results found for requested labs within last 365 days.     Component Value Date/Time   CHOL 104 03/21/2022 1029   CHOL 110 09/04/2021 0843   TRIG 161 (H) 03/21/2022 1029   HDL 37 (L) 03/21/2022 1029   HDL 42 09/04/2021 0843   CHOLHDL 2.8 03/21/2022 1029   VLDL 32 03/21/2022 1029    LDLCALC 35 03/21/2022 1029   LDLCALC 44 09/04/2021 0843     Assessment and Plan:  CAD manifested by NSTEMI in 2021 s/p 4 vessel CABG (LIMA to LAD, SVG to D1, OM1, OM 3 and LAA clipping), angina free: Continue aspirin 81 mg once daily, rosuvastatin 20 mg nightly.  ER precautions for chest pain provided.  HLD, at goal: LDL 35 in 2023.  Continue rosuvastatin 20 mg nightly.  Goal LDL less than 70.  Had myalgias with atorvastatin.  Paroxysmal A-fib s/p DCCV in 2021: Patient had postop A-fib after CABG in 2021 which resolved with amiodarone. He had another episode of A-fib for which she underwent DCCV in 2021.  No recurrences of A-fib since then except that he was found to have 10% A-fib burden on event monitor in 07/2022 and was sent to A-fib clinic who  recommended to continue amiodarone. Currently on amiodarone 200 mg once daily which I will discontinue due to long-term side effects.  EKG today showed NSR, HR 56 bpm. He was previously on metoprolol succinate which expired, HR 56 bpm today, will start diltiazem 30 mg every 8 hours as needed for palpitations. He did not have any palpitations in the last 1 year. Continue Eliquis 5 mg twice daily. No risk of falls.  Obtain home sleep study for OSA evaluation.  HTN, controlled: Has occasional low blood pressures, frequency once a month.  Continue losartan 25 mg once daily.   Trivial atrial regurgitation in 2023: Will repeat echocardiogram in 3 to 5 days.    Medication Adjustments/Labs and Tests Ordered: Current medicines are reviewed at length with the patient today.  Concerns regarding medicines are outlined above.    Disposition:  Follow up   Signed, Meoshia Billing Verne Spurr, MD, 08/06/2023 10:37 AM    [facility] [address] [place], [place] [address]

## 2023-08-08 ENCOUNTER — Other Ambulatory Visit: Payer: Self-pay

## 2023-08-08 DIAGNOSIS — G4733 Obstructive sleep apnea (adult) (pediatric): Secondary | ICD-10-CM

## 2023-09-09 ENCOUNTER — Encounter (HOSPITAL_BASED_OUTPATIENT_CLINIC_OR_DEPARTMENT_OTHER): Payer: Self-pay | Admitting: Pulmonary Disease

## 2024-02-02 ENCOUNTER — Encounter (HOSPITAL_COMMUNITY): Payer: Self-pay

## 2024-02-02 ENCOUNTER — Other Ambulatory Visit: Payer: Self-pay

## 2024-02-02 ENCOUNTER — Telehealth: Payer: Self-pay | Admitting: Cardiology

## 2024-02-02 ENCOUNTER — Emergency Department (HOSPITAL_COMMUNITY): Payer: Medicare Other

## 2024-02-02 ENCOUNTER — Emergency Department (HOSPITAL_COMMUNITY)
Admission: EM | Admit: 2024-02-02 | Discharge: 2024-02-02 | Disposition: A | Discharge status: Home / Self Care | Payer: Medicare Other | Attending: Emergency Medicine | Admitting: Emergency Medicine

## 2024-02-02 DIAGNOSIS — Z7901 Long term (current) use of anticoagulants: Secondary | ICD-10-CM | POA: Insufficient documentation

## 2024-02-02 DIAGNOSIS — R0602 Shortness of breath: Secondary | ICD-10-CM | POA: Diagnosis present

## 2024-02-02 DIAGNOSIS — I10 Essential (primary) hypertension: Secondary | ICD-10-CM | POA: Diagnosis not present

## 2024-02-02 DIAGNOSIS — Z79899 Other long term (current) drug therapy: Secondary | ICD-10-CM | POA: Insufficient documentation

## 2024-02-02 DIAGNOSIS — I2510 Atherosclerotic heart disease of native coronary artery without angina pectoris: Secondary | ICD-10-CM | POA: Insufficient documentation

## 2024-02-02 DIAGNOSIS — Z7982 Long term (current) use of aspirin: Secondary | ICD-10-CM | POA: Insufficient documentation

## 2024-02-02 DIAGNOSIS — Z955 Presence of coronary angioplasty implant and graft: Secondary | ICD-10-CM | POA: Insufficient documentation

## 2024-02-02 DIAGNOSIS — R0600 Dyspnea, unspecified: Secondary | ICD-10-CM

## 2024-02-02 DIAGNOSIS — I4891 Unspecified atrial fibrillation: Secondary | ICD-10-CM | POA: Insufficient documentation

## 2024-02-02 LAB — CBC
HCT: 35.3 % — ABNORMAL LOW (ref 39.0–52.0)
Hemoglobin: 10.7 g/dL — ABNORMAL LOW (ref 13.0–17.0)
MCH: 24.5 pg — ABNORMAL LOW (ref 26.0–34.0)
MCHC: 30.3 g/dL (ref 30.0–36.0)
MCV: 80.8 fL (ref 80.0–100.0)
Platelets: 336 10*3/uL (ref 150–400)
RBC: 4.37 MIL/uL (ref 4.22–5.81)
RDW: 15.6 % — ABNORMAL HIGH (ref 11.5–15.5)
WBC: 7.5 10*3/uL (ref 4.0–10.5)
nRBC: 0 % (ref 0.0–0.2)

## 2024-02-02 LAB — BASIC METABOLIC PANEL
Anion gap: 11 (ref 5–15)
BUN: 17 mg/dL (ref 8–23)
CO2: 19 mmol/L — ABNORMAL LOW (ref 22–32)
Calcium: 8.8 mg/dL — ABNORMAL LOW (ref 8.9–10.3)
Chloride: 104 mmol/L (ref 98–111)
Creatinine, Ser: 1.01 mg/dL (ref 0.61–1.24)
GFR, Estimated: >60 mL/min (ref >60)
Glucose, Bld: 124 mg/dL — ABNORMAL HIGH (ref 70–99)
Potassium: 3.8 mmol/L (ref 3.5–5.1)
Sodium: 134 mmol/L — ABNORMAL LOW (ref 135–145)

## 2024-02-02 LAB — BRAIN NATRIURETIC PEPTIDE: B Natriuretic Peptide: 310 pg/mL — ABNORMAL HIGH (ref 0.0–100.0)

## 2024-02-02 LAB — TROPONIN I (HIGH SENSITIVITY)
Troponin I (High Sensitivity): 9 ng/L (ref <18)
Troponin I (High Sensitivity): 9 ng/L (ref <18)

## 2024-02-02 MED ORDER — AMIODARONE HCL 200 MG PO TABS: 200.0000 mg | ORAL_TABLET | Freq: Two times a day (BID) | ORAL | 0 refills | Status: DC

## 2024-02-02 MED ORDER — FUROSEMIDE 10 MG/ML IJ SOLN
40.0000 mg | Freq: Once | INTRAMUSCULAR | Status: AC
  Administered 2024-02-02: 40 mg via INTRAVENOUS
  Filled 2024-02-02: qty 4

## 2024-02-02 MED ORDER — ETOMIDATE 2 MG/ML IV SOLN
10.0000 mg | Freq: Once | INTRAVENOUS | Status: AC
  Administered 2024-02-02: 7 mg via INTRAVENOUS
  Filled 2024-02-02: qty 10

## 2024-02-02 NOTE — ED Notes (Signed)
Urinal given to patient.

## 2024-02-02 NOTE — ED Provider Notes (Signed)
EMERGENCY DEPARTMENT AT [facility] Provider Note   CSN: [id number] Arrival date & time: 02/02/24  4098     History  Chief Complaint  Patient presents with   Chest Pain    Herbert Torres is a 78 y.o. male.  HPI    78 year old male comes in the emergency room with chief complaint of chest pain.  Patient has previous history of coronary artery disease status post CABG and NSTEMI in 2021 requiring PCI, he also has history of paroxysmal A-fib for which she is on diltiazem and Eliquis, hypertension, hyperlipidemia and mitral valve disease.  Patient presents to the ER because of shortness of breath, chest discomfort that has been off and on for the last several days.  Patient states that he typically starts feeling symptoms with minimal exertion.  Over the last 3 days, his symptoms have worsened.  He also has heart palpitations like feeling.  He has been taking his medications as prescribed.  He has woken up in the middle night feeling short of breath.  In general, patient is not always in A-fib.  He denies any history of lung disease.  Review of system is negative for new cough, fevers, chills, body aches, weight gain.  Home Medications Prior to Admission medications   Medication Sig Start Date End Date Taking? Authorizing Provider  apixaban (ELIQUIS) 5 MG TABS tablet Take 1 tablet (5 mg total) by mouth in the morning and at bedtime. 03/20/21   Chilton Si, MD  aspirin EC 81 MG EC tablet Take 1 tablet (81 mg total) by mouth daily. Swallow whole. Patient not taking: Reported on 08/06/2023 10/28/20   Barrett, Rae Roam, PA-C  cetirizine (ZYRTEC) 10 MG tablet Take 10 mg by mouth daily.    [provider]  diltiazem (CARDIZEM) 30 MG tablet Take 1 tablet (30 mg total) by mouth every 8 (eight) hours as needed. 08/06/23   Mallipeddi, Vishnu P, MD  empagliflozin (JARDIANCE) 25 MG TABS tablet Take by mouth daily. Patient not taking: Reported on 08/06/2023     [provider]  ezetimibe (ZETIA) 10 MG tablet TAKE 1 TABLET BY MOUTH EVERY DAY 04/23/21   Chilton Si, MD  losartan (COZAAR) 25 MG tablet Take 25 mg by mouth daily. 03/11/22   [provider]  metFORMIN (GLUCOPHAGE) 1000 MG tablet Take 1,000 mg by mouth 2 (two) times daily. Patient not taking: Reported on 08/15/2022 02/08/16   [provider]  metoprolol succinate (TOPROL-XL) 50 MG 24 hr tablet Take 1 tablet (50 mg total) by mouth daily. Take with or immediately following a meal. 08/08/22 08/06/23  Turner, Cornelious Bryant, MD  omeprazole (PRILOSEC) 20 MG capsule Take 20 mg by mouth daily. 02/08/16   [provider]  rosuvastatin (CRESTOR) 20 MG tablet Take 1 tablet (20 mg total) by mouth daily. 02/22/21   Chilton Si, MD  tamsulosin (FLOMAX) 0.4 MG CAPS capsule Take 0.4 mg by mouth.    [provider]  TRADJENTA 5 MG TABS tablet Take 5 mg by mouth daily. 03/18/22   [provider]      Allergies    Atorvastatin    Review of Systems   Review of Systems  All other systems reviewed and are negative.   Physical Exam Updated Vital Signs BP (!) 179/98 (BP Location: Left Arm)   Pulse 87   Temp 98.3 F (36.8 C) (Oral)   Resp (!) 22   Ht 6' (1.829 m)   Wt 99.3 kg  SpO2 95%   BMI 29.69 kg/m  Physical Exam Vitals and nursing note reviewed.  Constitutional:      Appearance: He is well-developed.  HENT:     Head: Atraumatic.  Cardiovascular:     Rate and Rhythm: Rhythm irregular.     Heart sounds: Murmur heard.  Pulmonary:     Effort: Pulmonary effort is normal.     Breath sounds: Normal breath sounds. No rales.  Musculoskeletal:     Cervical back: Neck supple.  Skin:    General: Skin is warm.  Neurological:     Mental Status: He is alert and oriented to person, place, and time.     ED Results / Procedures / Treatments   Labs (all labs ordered are listed, but only abnormal results are displayed) Labs Reviewed  BASIC  METABOLIC PANEL - Abnormal; Notable for the following components:      Result Value   Sodium 134 (*)    CO2 19 (*)    Glucose, Bld 124 (*)    Calcium 8.8 (*)    All other components within normal limits  CBC - Abnormal; Notable for the following components:   Hemoglobin 10.7 (*)    HCT 35.3 (*)    MCH 24.5 (*)    RDW 15.6 (*)    All other components within normal limits  BRAIN NATRIURETIC PEPTIDE - Abnormal; Notable for the following components:   B Natriuretic Peptide 310.0 (*)    All other components within normal limits  TROPONIN I (HIGH SENSITIVITY)  TROPONIN I (HIGH SENSITIVITY)    EKG EKG Interpretation Date/Time:  Monday February 02 2024 10:22:20 EST Ventricular Rate:  110 PR Interval:    QRS Duration:  94 QT Interval:  374 QTC Calculation: 506 R Axis:   73  Text Interpretation: Atrial fibrillation Borderline repolarization abnormality Prolonged QT interval afib is new Confirmed by Derwood Kaplan [phone]) on 02/02/2024 11:30:54 AM  Radiology DG Chest 2 View Result Date: 02/02/2024 CLINICAL DATA:  Chest pain and shortness of breath. EXAM: CHEST - 2 VIEW COMPARISON:  Chest radiograph dated December 01, 2020. FINDINGS: Stable cardiomegaly. Prior median sternotomy and CABG. Left atrial appendage clip in place. Streaky opacities at the right lung base. Mild diffuse interstitial prominence. No pleural effusion or pneumothorax. Degenerative changes of the thoracic spine. No acute osseous abnormality. IMPRESSION: 1. Streaky opacities at the right lung base could represent atelectasis, infiltrate, or asymmetric edema. 2. Stable cardiomegaly with findings suggestive of pulmonary vascular congestion. Electronically Signed   By: Hart Robinsons M.D.   On: 02/02/2024 12:47    Procedures .Sedation  Date/Time: 02/02/2024 1:46 PM  Performed by: Derwood Kaplan, MD Authorized by: Derwood Kaplan, MD   Consent:    Consent obtained:  Written   Consent given by:  Patient   Risks  discussed:  Allergic reaction, dysrhythmia, inadequate sedation, nausea, prolonged hypoxia resulting in organ damage, prolonged sedation necessitating reversal, respiratory compromise necessitating ventilatory assistance and intubation and vomiting   Alternatives discussed:  Analgesia without sedation, anxiolysis and regional anesthesia Universal protocol:    Procedure explained and questions answered to patient or proxy's satisfaction: yes     Relevant documents present and verified: yes     Test results available: yes     Imaging studies available: yes     Required blood products, implants, devices, and special equipment available: yes     Site/side marked: yes     Immediately prior to procedure, a time out was called: yes  Patient identity confirmed:  Verbally with patient Indications:    Procedure necessitating sedation performed by:  Physician performing sedation Pre-sedation assessment:    Time since last food or drink:  6 hours   ASA classification: class 3 - patient with severe systemic disease     Mouth opening:  3 or more finger widths   Thyromental distance:  4 finger widths   Mallampati score:  II - soft palate, uvula, fauces visible   Neck mobility: normal     Pre-sedation assessments completed and reviewed: airway patency, cardiovascular function, hydration status, mental status, nausea/vomiting, pain level, respiratory function and temperature   A pre-sedation assessment was completed prior to the start of the procedure Immediate pre-procedure details:    Reassessment: Patient reassessed immediately prior to procedure     Reviewed: vital signs, relevant labs/tests and NPO status     Verified: bag valve mask available, emergency equipment available, intubation equipment available, IV patency confirmed, oxygen available and suction available   Procedure details (see MAR for exact dosages):    Preoxygenation:  Nasal cannula   Sedation:  Etomidate   Intended level of  sedation: deep   Analgesia:  None   Intra-procedure monitoring:  Blood pressure monitoring, cardiac monitor, continuous pulse oximetry, frequent LOC assessments, frequent vital sign checks and continuous capnometry   Intra-procedure events: none     Total Provider sedation time (minutes):  16 Post-procedure details:   A post-sedation assessment was completed following the completion of the procedure.   Attendance: Constant attendance by certified staff until patient recovered     Recovery: Patient returned to pre-procedure baseline     Post-sedation assessments completed and reviewed: airway patency, cardiovascular function, hydration status, mental status, nausea/vomiting, pain level, respiratory function and temperature     Patient is stable for discharge or admission: yes     Procedure completion:  Tolerated well, no immediate complications .Cardioversion  Date/Time: 02/02/2024 1:47 PM  Performed by: Derwood Kaplan, MD Authorized by: Derwood Kaplan, MD   Consent:    Consent obtained:  Written   Consent given by:  Patient   Risks discussed:  Cutaneous burn, death, induced arrhythmia and pain   Alternatives discussed:  Rate-control medication and delayed treatment Universal protocol:    Procedure explained and questions answered to patient or proxy's satisfaction: yes     Relevant documents present and verified: yes     Test results available and properly labeled: yes     Imaging studies available: yes     Required blood products, implants, devices, and special equipment available: yes     Site/side marked: yes     Immediately prior to procedure a time out was called: yes     Patient identity confirmed:  Arm band Pre-procedure details:    Cardioversion basis:  Elective   Rhythm:  Atrial fibrillation Patient sedated: Yes. Refer to sedation procedure documentation for details of sedation.  Attempt one:    Cardioversion mode:  Synchronous   Waveform:  Biphasic   Shock (Joules):   200   Shock outcome:  Conversion to normal sinus rhythm Post-procedure details:    Patient status:  Awake   Patient tolerance of procedure:  Tolerated well, no immediate complications .Critical Care  Performed by: Derwood Kaplan, MD Authorized by: Derwood Kaplan, MD   Critical care provider statement:    Critical care time (minutes):  41   Critical care time was exclusive of:  Separately billable procedures and treating other patients   Critical care  was necessary to treat or prevent imminent or life-threatening deterioration of the following conditions:  Circulatory failure and cardiac failure   Critical care was time spent personally by me on the following activities:  Development of treatment plan with patient or surrogate, discussions with consultants, evaluation of patient's response to treatment, examination of patient, ordering and review of laboratory studies, ordering and review of radiographic studies, ordering and performing treatments and interventions, pulse oximetry, re-evaluation of patient's condition, review of old charts and obtaining history from patient or surrogate     Medications Ordered in ED Medications  etomidate (AMIDATE) injection 10 mg (7 mg Intravenous Given 02/02/24 1329)    ED Course/ Medical Decision Making/ A&P Clinical Course as of 02/02/24 1350  Mon Feb 02, 2024  1228 Case discussed with Dr. Jenene Slicker, cardiology. Discussed with her that patient is not symptomatic A-fib clinically.  Discussed his medication regimen and requested recommendation on whether to proceed with cardioversion or rate control and admission.  Dr. Jenene Slicker recommends that we can cardiovert the patient.  Give patient IV Lasix in the ER only.  Depending on his post cardioversion heart rate, add as needed diltiazem.  Start patient back on amiodarone 200 mg twice daily and have patient follow-up with A-fib clinic to see if he is a candidate for any alternate therapy. [AN]     Clinical Course User Index [AN] Derwood Kaplan, MD                                 Medical Decision Making Amount and/or Complexity of Data Reviewed Labs: ordered. Radiology: ordered.  Risk Prescription drug management.   This patient presents to the ED with chief complaint(s) of shortness of breath, chest discomfort with pertinent past medical history of A-fib, CAD.The complaint involves an extensive differential diagnosis and also carries with it a high risk of complications and morbidity.    The differential diagnosis includes : Acute CHF exacerbation, pulmonary edema, symptomatic A-fib, acute coronary syndrome.  Severe anemia, electrolyte abnormality    The initial plan is to get basic labs.  Patient noted to be in A-fib, which appears to be not his baseline.  I suspect clinically that he is having symptomatic A-fib.  He has been compliant with his medications including Eliquis.   Additional history obtained: Additional history obtained from family Records reviewed  previous cardiology notes including from 2023 and 2024.  In 2024 he was switched off of amiodarone and started on diltiazem.  During both of those clinic visits he was in normal sinus rhythm.  Independent labs interpretation:  The following labs were independently interpreted:   Independent visualization and interpretation of imaging: - I independently visualized the following imaging with scope of interpretation limited to determining acute life threatening conditions related to emergency care: X-ray of the chest, which revealed no evidence of pneumothorax.  No clear evidence of large pleural effusion.  Treatment and Reassessment: Recommendation from cardiology discussed with the patient.  Lab results discussed.  Patient wants [place] to proceed with cardioversion.  Consultation: - Consulted or discussed management/test interpretation with external professional: Cardiology.  See the workup tab.    Final  Clinical Impression(s) / ED Diagnoses Final diagnoses:  Atrial fibrillation with RVR (HCC)  Dyspnea, unspecified type    Rx / DC Orders ED Discharge Orders     None         Derwood Kaplan, MD 02/06/24 [phone]

## 2024-02-02 NOTE — Telephone Encounter (Signed)
 Spoke with pt who c/o chest pain and SOB that has been getting worse over the last 3-4 days. Pt unable to walk without becoming SOB. Pt states that when he bends over his chest pain is worse. Pt taken to the ER via wheelchair for evaluation.

## 2024-02-02 NOTE — Discharge Instructions (Addendum)
 You were seen in the ER for shortness of breath and heart palpitations. You are found to be in atrial fibrillation.  We suspect that your symptoms are because of symptoms due to your atrial fibrillation.  In the ER you were cardioverted successfully.  We discussed your case with cardiologist.  They recommended cardioversion followed by restarting your amiodarone  200 mg twice a day.  They want you to take 30 mg of Cardizem  only if needed every 8 hours for palpitations or heart rate over 110.  We have sent a referral to A-fib clinic.  Please call them if you do not hear from them tomorrow for an appointment.  Return to the ER if you start having worsening symptoms.

## 2024-02-02 NOTE — ED Notes (Signed)
 Water given to patient

## 2024-02-02 NOTE — ED Triage Notes (Signed)
 Pt c/o chest pain and increased SOB starting on Friday. Pt has history of a-fib. Pt states unable to keep balance and difficulty with ADLs without getting short of breath.

## 2024-02-02 NOTE — Telephone Encounter (Signed)
 Patient walked in this morning. He is having some issues SOB and, getting tired easily. For at least 3 or 4 days. Having some CP. Has an appt scheduled on 2/27. Please advise. MH

## 2024-02-19 ENCOUNTER — Encounter: Payer: Self-pay | Admitting: Medical

## 2024-02-19 ENCOUNTER — Other Ambulatory Visit (HOSPITAL_COMMUNITY)
Admission: RE | Admit: 2024-02-19 | Discharge: 2024-02-19 | Disposition: A | Discharge status: Home / Self Care | Payer: Medicare Other | Source: Ambulatory Visit | Attending: Medical | Admitting: Medical

## 2024-02-19 ENCOUNTER — Ambulatory Visit: Payer: Medicare Other | Attending: Medical | Admitting: Medical

## 2024-02-19 VITALS — BP 120/70 | HR 118 | Ht 72.0 in | Wt 229.4 lb

## 2024-02-19 DIAGNOSIS — I4819 Other persistent atrial fibrillation: Secondary | ICD-10-CM | POA: Diagnosis not present

## 2024-02-19 DIAGNOSIS — I25118 Atherosclerotic heart disease of native coronary artery with other forms of angina pectoris: Secondary | ICD-10-CM

## 2024-02-19 DIAGNOSIS — I4891 Unspecified atrial fibrillation: Secondary | ICD-10-CM | POA: Insufficient documentation

## 2024-02-19 DIAGNOSIS — E782 Mixed hyperlipidemia: Secondary | ICD-10-CM

## 2024-02-19 DIAGNOSIS — I10 Essential (primary) hypertension: Secondary | ICD-10-CM

## 2024-02-19 LAB — MAGNESIUM: Magnesium: 1.4 mg/dL — ABNORMAL LOW (ref 1.7–2.4)

## 2024-02-19 LAB — TSH: TSH: 2.113 u[IU]/mL (ref 0.350–4.500)

## 2024-02-19 MED ORDER — FUROSEMIDE 20 MG PO TABS: 20.0000 mg | ORAL_TABLET | Freq: Every day | ORAL | 6 refills | Status: AC | PRN

## 2024-02-19 MED ORDER — METOPROLOL SUCCINATE ER 50 MG PO TB24: 75.0000 mg | ORAL_TABLET | Freq: Every day | ORAL | 3 refills | Status: DC

## 2024-02-19 NOTE — Patient Instructions (Signed)
 Medication Instructions:  Your physician has recommended you make the following change in your medication:   Increase Toprol XL to 75 mg Daily  Start Lasix 20 mg Daily as needed.   *If you need a refill on your cardiac medications before your next appointment, please call your pharmacy*   Lab Work: Your physician recommends that you return for lab work today. ( TSH, Mg)   If you have labs (blood work) drawn today and your tests are completely normal, you will receive your results only by: MyChart Message (if you have MyChart) OR A paper copy in the mail If you have any lab test that is abnormal or we need to change your treatment, we will call you to review the results.   Testing/Procedures: Your physician has requested that you have an echocardiogram. Echocardiography is a painless test that uses sound waves to create images of your heart. It provides your doctor with information about the size and shape of your heart and how well your heart's chambers and valves are working. This procedure takes approximately one hour. There are no restrictions for this procedure. Please do NOT wear cologne, perfume, aftershave, or lotions (deodorant is allowed). Please arrive 15 minutes prior to your appointment time.  Please note: We ask at that you not bring children with you during ultrasound (echo/ vascular) testing. Due to room size and safety concerns, children are not allowed in the ultrasound rooms during exams. Our front office staff cannot provide observation of children in our lobby area while testing is being conducted. An adult accompanying a patient to their appointment will only be allowed in the ultrasound room at the discretion of the ultrasound technician under special circumstances. We apologize for any inconvenience.    Follow-Up: At [facility], you and your health needs are our priority.  As part of our continuing mission to provide you with exceptional heart care, we  have created designated Provider Care Teams.  These Care Teams include your primary Cardiologist (physician) and Advanced Practice Providers (APPs -  Physician Assistants and Nurse Practitioners) who all work together to provide you with the care you need, when you need it.  We recommend signing up for the patient portal called "MyChart".  Sign up information is provided on this After Visit Summary.  MyChart is used to connect with patients for Virtual Visits (Telemedicine).  Patients are able to view lab/test results, encounter notes, upcoming appointments, etc.  Non-urgent messages can be sent to your provider as well.   To learn more about what you can do with MyChart, go to [email or web address].    Your next appointment:   3 month(s)  Provider:   You may see Vishnu P Mallipeddi, MD or one of the following Advanced Practice Providers on your designated Care Team:   Randall An, PA-C  Jacolyn Reedy, PA-C     Other Instructions Thank you for choosing [facility]!

## 2024-02-19 NOTE — Progress Notes (Signed)
 Cardiology Office Note:  .   Date:  02/19/2024  ID:  Basel, Defalco 1946-02-05, MRN [id number] PCP: [facility], [facility] Providers Cardiologist:  Marjo Bicker, MD {    History of Present Illness: Herbert Torres is a 78 y.o. male with a history of CAD status post CABG in 2021, hyperlipidemia, hypertension, paroxysmal A-fib, diabetes type 2 who is here for ER follow-up.  Patient has a history of CAD status post non-STEMI in 2021.  Cath revealed multivessel CAD.  He underwent CABG x 4 and left atrial clipping on 10/18/2020.  Hospitalization was complicated by postop A-fib.  He converted to normal sinus rhythm on amiodarone.  He went back into A-fib and had a cardioversion October 28, 2020.  He was seen by Dr. Mayford Knife in 2023 complaining of palpitations and noted to be in A-fib.  Toprol was increased and he was referred to the A-fib clinic.  He was seen by the A-fib clinic in August 2023 and was in normal sinus rhythm.  Many options were discussed at that time, however patient desired to not pursue them.  Patient was last seen in August 2024 and was overall stable from a cardiac perspective.  Patient was seen in the ER 02/02/2024 with chest pain.  Blood pressure was 179/98, pulse 87 bpm.  BNP 310.  Hemoglobin 10.7.  EKG showed A-fib 110bpm.  He was given IV Lasix. He was cardivoerted in the ER to NSR per cardiology recommendations.  Today, the patient is back in Afib, HR 94bpm. Since discharge he has felt short of breath. Also can gets clammy and reports chest pain at night. He denies lower leg edema. He has been taking amiodarone and Eliquis, denies missing doses.  Studies Reviewed: Marland Kitchen   EKG Interpretation Date/Time:  Thursday February 19 2024 13:31:30 EST Ventricular Rate:  94 PR Interval:    QRS Duration:  88 QT Interval:  410 QTC Calculation: 512 R Axis:   101  Text Interpretation: Atrial fibrillation with premature ventricular or aberrantly  conducted complexes Rightward axis Nonspecific ST abnormality Prolonged QT When compared with ECG of 02-Feb-2024 13:30, PREVIOUS ECG IS PRESENT Confirmed by Fransico Michael, Caley Ciaramitaro ([id number]) on 02/19/2024 1:36:52 PM    Heart monitor 07/2022   Predominant rhythm was normal sinus rhythm with average heart rate 66 bpm and ranged from 50 to 109 bpm   Paroxysmal atrial fibrillation with RVR with A-fib burden 10% with heart rate ranging from 65 to 160 bpm.  The longest episode lasted 11 hours 40 minutes.  Atrial fibrillation was present at the activation of device.   Occasional PACs, bigeminal PACs, atrial couplets, atrial triplets   37 SVT episodes with the longest interval lasting 10.1 seconds and fastest interval 158 bpm   Rare PVCs, ventricular couplets and ventricular triplets were present.  PVC load less than 1%    Echo 03/2022 1. Inferior basal hypokinesis . Left ventricular ejection fraction, by  estimation, is 55%. The left ventricle has normal function. The left  ventricle demonstrates regional wall motion abnormalities (see scoring  diagram/findings for description). There  is mild left ventricular hypertrophy. Left ventricular diastolic  parameters were normal.   2. Right ventricular systolic function is normal. The right ventricular  size is normal. There is normal pulmonary artery systolic pressure.   3. Left atrial size was moderately dilated.   4. Right atrial size was moderately dilated.   5. The mitral valve is abnormal. Trivial mitral  valve regurgitation. No  evidence of mitral stenosis.   6. The aortic valve is tricuspid. There is moderate calcification of the  aortic valve. There is moderate thickening of the aortic valve. Aortic  valve regurgitation is trivial. Aortic valve sclerosis is present, with no  evidence of aortic valve stenosis.   7. The inferior vena cava is normal in size with greater than 50%  respiratory variability, suggesting right atrial pressure of 3 mmHg.   LHC  09/2020 Mid RCA to Dist RCA lesion is 100% stenosed. RV Branch lesion is 90% stenosed. --------------------------- Prox LAD to Mid LAD lesion is 80% stenosed with 70% stenosed side branch in 1st Diag & 1st Sept lesion is 70% stenosed. Mid LAD lesion is 75% stenosed. --------------------------- Prox Cx lesion is 75% stenosed. Lat 3rd Mrg lesion is 80% stenosed. Diminutive 4th Mrg lesion is 90% stenosed. Dist Cx / OM lesion is 90% stenosed. =================== There is no aortic valve stenosis. LV end diastolic pressure is normal. The left ventricular systolic function is normal. There is trivial (1+) mitral regurgitation.   SUMMARY Severe multivessel CAD:  Possible subacute occlusion or CTO of RCA after RV marginal branch that has an 90% stenosis, with minimal left to right collaterals;  Focal eccentric calcified 80% stenosis of the LAD at the takeoff of SP1 (1st Sept) and D1 (1st Diag) along with eccentric 70% mid LAD, and  75% proximal major LCx that has diffuse areas of relatively severe stenosis in the sidebranches. Relatively normal EF with mild inferior hypokinesis.  Relatively normal LVEDP.     RECOMMENDATIONS With diabetes and existing A. fib on Eliquis already, best option for this gentleman is CVTS consult for CABG. Continue aggressive risk factor modification.   Results reviewed with Dr. [place].  CVTS consult called        Physical Exam:   VS:  BP 120/70 (BP Location: Left Arm, Cuff Size: Normal)   Pulse (!) 118   Ht 6' (1.829 m)   Wt 229 lb 6.4 oz (104.1 kg)   SpO2 96%   BMI 31.11 kg/m    Wt Readings from Last 3 Encounters:  02/19/24 229 lb 6.4 oz (104.1 kg)  02/02/24 218 lb 14.7 oz (99.3 kg)  08/06/23 219 lb (99.3 kg)    GEN: Well nourished, well developed in no acute distress NECK: No JVD; No carotid bruits CARDIAC: Irreg Irreg, no murmurs, rubs, gallops RESPIRATORY:  Clear to auscultation without rales, wheezing or rhonchi  ABDOMEN: Soft, non-tender,  non-distended EXTREMITIES:  No edema; No deformity   ASSESSMENT AND PLAN: .    Persistent Afib - patient had post-op afib after CABG that resolved with amiodarone - s/p DCCV in 2021 - heart monitor in 07/2022 showed 10% afib burden and he was referred to Afib clinic, who recommended continuing amiodarone - he is on Toprol 50mg  daily>I will increase this to 75mg  daily.  - he is on amiodarone 200mg  daily - Eliquis 5mg  BID for stroke ppx - he also has dilt 30mg  to take as needed - Recent ER visit for Afib where he was successfully cardioverted. He was mildly volume up given IV lasix - today he is back in Afib and reports DOE and chest pain - appears he has failed amiodarone, need to consider other AA vs ablation - I will increase Toprol and refer back to the Afib clinic. I will also give lasix to use PRN for swelling. - I will check an echo and update labs (TSH and Mag)  CAD s/p CABG x4 - patient reports chest pain in the setting of afib - I will update echo as above - continue ASA and Crestor  HTN - BP is well controlled - continue Losartan 25mg  daily - increase Toprol to 75mg  daily as above  HLD - He had myalgias with Lipitor - LDL 35 in 2023 - continue Crestor 20mg  daily  Dispo: Follow-up in 3 months  Signed, Loletha Bertini David Stall, PA-C

## 2024-02-20 ENCOUNTER — Encounter: Payer: Self-pay | Admitting: Internal Medicine

## 2024-03-01 ENCOUNTER — Encounter (HOSPITAL_COMMUNITY): Payer: Self-pay | Admitting: Physician Assistant

## 2024-03-01 ENCOUNTER — Ambulatory Visit (HOSPITAL_COMMUNITY)
Admission: RE | Admit: 2024-03-01 | Discharge: 2024-03-01 | Disposition: A | Discharge status: Home / Self Care | Payer: Medicare Other | Source: Ambulatory Visit | Attending: Physician Assistant | Admitting: Physician Assistant

## 2024-03-01 VITALS — BP 156/70 | HR 93 | Ht 72.0 in | Wt 224.8 lb

## 2024-03-01 DIAGNOSIS — I2510 Atherosclerotic heart disease of native coronary artery without angina pectoris: Secondary | ICD-10-CM | POA: Diagnosis not present

## 2024-03-01 DIAGNOSIS — Z79899 Other long term (current) drug therapy: Secondary | ICD-10-CM | POA: Insufficient documentation

## 2024-03-01 DIAGNOSIS — I10 Essential (primary) hypertension: Secondary | ICD-10-CM | POA: Diagnosis not present

## 2024-03-01 DIAGNOSIS — I4819 Other persistent atrial fibrillation: Secondary | ICD-10-CM | POA: Diagnosis not present

## 2024-03-01 DIAGNOSIS — E119 Type 2 diabetes mellitus without complications: Secondary | ICD-10-CM | POA: Diagnosis not present

## 2024-03-01 DIAGNOSIS — Z7984 Long term (current) use of oral hypoglycemic drugs: Secondary | ICD-10-CM | POA: Diagnosis not present

## 2024-03-01 DIAGNOSIS — E785 Hyperlipidemia, unspecified: Secondary | ICD-10-CM | POA: Insufficient documentation

## 2024-03-01 DIAGNOSIS — Z7901 Long term (current) use of anticoagulants: Secondary | ICD-10-CM | POA: Diagnosis not present

## 2024-03-01 DIAGNOSIS — Z5181 Encounter for therapeutic drug level monitoring: Secondary | ICD-10-CM | POA: Insufficient documentation

## 2024-03-01 DIAGNOSIS — D6869 Other thrombophilia: Secondary | ICD-10-CM | POA: Insufficient documentation

## 2024-03-01 DIAGNOSIS — Z951 Presence of aortocoronary bypass graft: Secondary | ICD-10-CM | POA: Diagnosis not present

## 2024-03-01 LAB — BASIC METABOLIC PANEL
Anion gap: 13 (ref 5–15)
BUN: 13 mg/dL (ref 8–23)
CO2: 18 mmol/L — ABNORMAL LOW (ref 22–32)
Calcium: 9.1 mg/dL (ref 8.9–10.3)
Chloride: 108 mmol/L (ref 98–111)
Creatinine, Ser: 1.15 mg/dL (ref 0.61–1.24)
GFR, Estimated: >60 mL/min (ref >60)
Glucose, Bld: 120 mg/dL — ABNORMAL HIGH (ref 70–99)
Potassium: 4.6 mmol/L (ref 3.5–5.1)
Sodium: 139 mmol/L (ref 135–145)

## 2024-03-01 LAB — CBC
HCT: 35.3 % — ABNORMAL LOW (ref 39.0–52.0)
Hemoglobin: 10 g/dL — ABNORMAL LOW (ref 13.0–17.0)
MCH: 22.9 pg — ABNORMAL LOW (ref 26.0–34.0)
MCHC: 28.3 g/dL — ABNORMAL LOW (ref 30.0–36.0)
MCV: 81 fL (ref 80.0–100.0)
Platelets: 331 10*3/uL (ref 150–400)
RBC: 4.36 MIL/uL (ref 4.22–5.81)
RDW: 17 % — ABNORMAL HIGH (ref 11.5–15.5)
WBC: 6.2 10*3/uL (ref 4.0–10.5)
nRBC: 0 % (ref 0.0–0.2)

## 2024-03-01 NOTE — Progress Notes (Signed)
 Primary Care Physician: Center, [facility] Primary Cardiologist: Marjo Bicker, MD Electrophysiologist: None  Referring Physician: Dr Dannielle Burn is a 78 y.o. male with a history of CAD s/p CABG 2021, DM, HLD, HTN, atrial fibrillation who presents for follow up in the [facility]. Patient had postop A-fib after CABG in 2021 which resolved with amiodarone. He had another episode of A-fib for which he underwent DCCV in 11/52021 and 03/27/22.  His amiodarone was discontinued 08/06/23 to avoid possible long term side effects as he was maintaining SR. He presented to the ED 02/02/24 with chest pain and was found to be back in afib. He underwent DCCV at that time and his amiodarone was resumed. Seen by Cadence Fransico Michael 02/19/24 and patient was back in afib. His metoprolol was increased. Patient is on Eliquis for stroke prevention.   Patient presents today for follow up for atrial fibrillation. He remains in afib with symptoms of fatigue with exertion and palpitations. He denies any bleeding issues on anticoagulation.    Today, he denies symptoms of chest pain, shortness of breath, orthopnea, PND, lower extremity edema, dizziness, presyncope, syncope, snoring, daytime somnolence, bleeding, or neurologic sequela. The patient is tolerating medications without difficulties and is otherwise without complaint today.    Atrial Fibrillation Risk Factors:  he does not have symptoms or diagnosis of sleep apnea. he does not have a history of rheumatic fever.   Atrial Fibrillation Management history:  Previous antiarrhythmic drugs: amiodarone  Previous cardioversions: 10/27/20, 03/27/22, 02/02/24 Previous ablations: none Anticoagulation history: Eliquis  ROS- All systems are reviewed and negative except as per the HPI above.  Past Medical History:  Diagnosis Date   Coronary artery disease    a. s/p CABG x 4 with LIMA-LAD, rSVG-D1, rSVG-OM1 and rSVG-OM3  and LAA clipping in 09/2020   Diabetes mellitus without complication (HCC)    GERD (gastroesophageal reflux disease)    HOH (hard of hearing)    AIDS   Hyperlipidemia    Hypertension    PAF (paroxysmal atrial fibrillation) (HCC)     Current Outpatient Medications  Medication Sig Dispense Refill   amiodarone (PACERONE) 200 MG tablet Take 1 tablet (200 mg total) by mouth 2 (two) times daily. 60 tablet 0   apixaban (ELIQUIS) 5 MG TABS tablet Take 1 tablet (5 mg total) by mouth in the morning and at bedtime. 28 tablet 0   diltiazem (CARDIZEM) 30 MG tablet Take 1 tablet (30 mg total) by mouth every 8 (eight) hours as needed. 270 tablet 3   empagliflozin (JARDIANCE) 25 MG TABS tablet Take by mouth daily.     ezetimibe (ZETIA) 10 MG tablet TAKE 1 TABLET BY MOUTH EVERY DAY 90 tablet 2   furosemide (LASIX) 20 MG tablet Take 1 tablet (20 mg total) by mouth daily as needed for fluid or edema. 30 tablet 6   losartan (COZAAR) 25 MG tablet Take 25 mg by mouth daily.     metFORMIN (GLUCOPHAGE) 1000 MG tablet Take 1,000 mg by mouth 2 (two) times daily.  4   metoprolol succinate (TOPROL-XL) 50 MG 24 hr tablet Take 1.5 tablets (75 mg total) by mouth daily. Take with or immediately following a meal. 135 tablet 3   omeprazole (PRILOSEC) 20 MG capsule Take 20 mg by mouth daily.  3   rosuvastatin (CRESTOR) 20 MG tablet Take 1 tablet (20 mg total) by mouth daily. 90 tablet 3   tamsulosin (FLOMAX) 0.4 MG  CAPS capsule Take 0.4 mg by mouth.     TRADJENTA 5 MG TABS tablet Take 5 mg by mouth daily.     No current facility-administered medications for this encounter.    Physical Exam: BP (!) 156/70   Pulse 93   Ht 6' (1.829 m)   Wt 102 kg   BMI 30.49 kg/m   GEN: Well nourished, well developed in no acute distress CARDIAC: Irregularly irregular rate and rhythm, no murmurs, rubs, gallops RESPIRATORY:  Clear to auscultation without rales, wheezing or rhonchi  ABDOMEN: Soft, non-tender,  non-distended EXTREMITIES:  No edema; No deformity   Wt Readings from Last 3 Encounters:  03/01/24 102 kg  02/19/24 104.1 kg  02/02/24 99.3 kg     EKG today demonstrates  Coarse Afib vs atypical atrial flutter with variable block Vent. rate 93 BPM PR interval * ms QRS duration 88 ms QT/QTcB 368/457 ms   Echo 04/05/22 demonstrated   1. Inferior basal hypokinesis . Left ventricular ejection fraction, by  estimation, is 55%. The left ventricle has normal function. The left  ventricle demonstrates regional wall motion abnormalities (see scoring  diagram/findings for description). There is mild left ventricular hypertrophy. Left ventricular diastolic parameters were normal.   2. Right ventricular systolic function is normal. The right ventricular  size is normal. There is normal pulmonary artery systolic pressure.   3. Left atrial size was moderately dilated.   4. Right atrial size was moderately dilated.   5. The mitral valve is abnormal. Trivial mitral valve regurgitation. No  evidence of mitral stenosis.   6. The aortic valve is tricuspid. There is moderate calcification of the  aortic valve. There is moderate thickening of the aortic valve. Aortic  valve regurgitation is trivial. Aortic valve sclerosis is present, with no  evidence of aortic valve stenosis.   7. The inferior vena cava is normal in size with greater than 50%  respiratory variability, suggesting right atrial pressure of 3 mmHg.    CHA2DS2-VASc Score = 5  The patient's score is based upon: CHF History: 0 HTN History: 1 Diabetes History: 1 Stroke History: 0 Vascular Disease History: 1 Age Score: 2 Gender Score: 0       ASSESSMENT AND PLAN: Persistent Atrial Fibrillation (ICD10:  I48.19) The patient's CHA2DS2-VASc score is 5, indicating a 7.2% annual risk of stroke.   Patient in symptomatic persistent afib. We discussed rhythm control options today including alternate AAD (dofetilide) or afib ablation. His  QT in SR is ~ 460 ms. He would prefer to continue on amiodarone. Will arrange for DCCV now that he has loaded on the medication for ~ 4 weeks. On the day of DCCV, he will decrease amiodarone to once daily.  Check bmet/cbc today. If he fails to maintain SR with amiodarone, then he will consider ablation.  Continue Eliquis 5 mg BID Continue amiodarone 200 mg BID for now.  Continue Toprol 75 mg daily  Secondary Hypercoagulable State (ICD10:  D68.69) The patient is at significant risk for stroke/thromboembolism based upon his CHA2DS2-VASc Score of 5.  Continue Apixaban (Eliquis).   High Risk Medication Monitoring (ICD 10: Z79.899) Intervals on ECG acceptable for amiodarone monitoring. Recent TSH reviewed.   CAD S/p CABG 2021 No anginal symptoms Followed by Dr Jenene Slicker  HTN Stable on current regimen    Follow up in the AF clinic post DCCV.    Informed Consent   Shared Decision Making/Informed Consent The risks (stroke, cardiac arrhythmias rarely resulting in the need for a  temporary or permanent pacemaker, skin irritation or burns and complications associated with conscious sedation including aspiration, arrhythmia, respiratory failure and death), benefits (restoration of normal sinus rhythm) and alternatives of a direct current cardioversion were explained in detail to Mr. Morini and he agrees to proceed.        Jorja Loa PA-C Afib Clinic [facility] [address] [place], [place] [address] [phone]

## 2024-03-01 NOTE — Patient Instructions (Addendum)
 Hold Jardiance 3 days prior to Cardioversion Do not take Metformin the morning of Cardioversion Hold Tradjenda 3 days prior to Cardioversion      Cardioversion scheduled for: March 19th 2025   - Arrive at the [facility] and go to admitting at 11:30am   - Do not eat or drink anything after midnight the night prior to your procedure.   - Take all your morning medication (except diabetic medications) with a sip of water prior to arrival.  - You will not be able to drive home after your procedure.    - Do NOT miss any doses of your blood thinner - if you should miss a dose please notify our office immediately.   - If you feel as if you go back into normal rhythm prior to scheduled cardioversion, please notify our office immediately.   If your procedure is canceled in the cardioversion suite you will be charged a cancellation fee.    Hold below medications 7 days prior to scheduled procedure/anesthesia.  Restart medication on the normal dosing day after scheduled procedure/anesthesia  Dulaglutide (Trulicity) Exenatide extended release (Bydureon bcise) Semaglutide (Ozempic) (WEGOVY)  Tirzepatide (Mounjaro)     Hold below medications 72 hours prior to scheduled procedure/anesthesia. Restart medication on the following day after scheduled procedure/anesthesia Bexagliflozin (Brenzavvy) Canagliflozin (Invokana) Canagliflozin/metformin (Invokamet/Invokamet XR) Dapagliflozin Marcelline Deist) Dapagliflozin/metformin (Xigduo XR) Dapagliflozin/saxagliptin Colbert Coyer) Empagliflozin (Jardiance) Empagliflozin/linagliptin (Glyxambi) Empagliflozin/linagliptin/metformin (Trijardy XR) Empagliflozin/metformin (Synjardy/Synjardy XR) Ertugliflozin (Steglatro) Ertugliflozin/metformin (Segluromet) Ertugliflozin/sitagliptin (Steglujan)    Hold below medications 24 hours prior to scheduled procedure/anesthesia.   Restart medication on the following day after scheduled  procedure/anesthesia   Exenatide (Byetta)  Liraglutide (Victoza, Saxenda)  Lixisenatide (Adlyxin)  Semaglutide (Rybelsus) Polyethylene Glycol Loxenatide        For those patients who have a scheduled procedure/anesthesia on the same day of the week as their dose, hold the medication on the day of surgery.  They can take their scheduled dose the week before.  **Patients on the above medications scheduled for elective procedures that have not held the medication for the appropriate amount of time are at risk of cancellation or change in the anesthetic plan.

## 2024-03-02 ENCOUNTER — Other Ambulatory Visit (HOSPITAL_COMMUNITY): Payer: Self-pay | Admitting: *Deleted

## 2024-03-02 DIAGNOSIS — I4819 Other persistent atrial fibrillation: Secondary | ICD-10-CM

## 2024-03-04 ENCOUNTER — Other Ambulatory Visit (HOSPITAL_COMMUNITY): Payer: Self-pay | Admitting: Physician Assistant

## 2024-03-04 ENCOUNTER — Ambulatory Visit (HOSPITAL_COMMUNITY)
Admission: RE | Admit: 2024-03-04 | Discharge: 2024-03-04 | Disposition: A | Discharge status: Home / Self Care | Payer: Medicare Other | Source: Ambulatory Visit | Attending: Medical | Admitting: Medical

## 2024-03-04 DIAGNOSIS — I4819 Other persistent atrial fibrillation: Secondary | ICD-10-CM

## 2024-03-04 LAB — CBC
Hematocrit: 34.4 % — ABNORMAL LOW (ref 37.5–51.0)
Hemoglobin: 9.8 g/dL — ABNORMAL LOW (ref 13.0–17.7)
MCH: 22.2 pg — ABNORMAL LOW (ref 26.6–33.0)
MCHC: 28.5 g/dL — ABNORMAL LOW (ref 31.5–35.7)
MCV: 78 fL — ABNORMAL LOW (ref 79–97)
Platelets: 417 10*3/uL (ref 150–450)
RBC: 4.41 x10E6/uL (ref 4.14–5.80)
RDW: 16.1 % — ABNORMAL HIGH (ref 11.6–15.4)
WBC: 8.3 10*3/uL (ref 3.4–10.8)

## 2024-03-04 LAB — ECHOCARDIOGRAM COMPLETE
Area-P 1/2: 3.65 cm2
S' Lateral: 3 cm

## 2024-03-04 NOTE — Progress Notes (Signed)
*  PRELIMINARY RESULTS* Echocardiogram 2D Echocardiogram has been performed.  Stacey Drain 03/04/2024, 10:57 AM

## 2024-03-05 ENCOUNTER — Other Ambulatory Visit (HOSPITAL_COMMUNITY): Payer: Self-pay | Admitting: *Deleted

## 2024-03-05 MED ORDER — AMIODARONE HCL 200 MG PO TABS: 200.0000 mg | ORAL_TABLET | Freq: Two times a day (BID) | ORAL | 1 refills | Status: DC

## 2024-03-10 ENCOUNTER — Ambulatory Visit (HOSPITAL_COMMUNITY): Admit: 2024-03-10 | Admitting: Cardiology

## 2024-03-10 ENCOUNTER — Encounter (HOSPITAL_COMMUNITY): Payer: Self-pay

## 2024-03-10 SURGERY — CARDIOVERSION (CATH LAB)
Anesthesia: General

## 2024-03-24 ENCOUNTER — Other Ambulatory Visit (HOSPITAL_COMMUNITY): Payer: Self-pay | Admitting: *Deleted

## 2024-03-24 ENCOUNTER — Ambulatory Visit (HOSPITAL_COMMUNITY)
Admission: RE | Admit: 2024-03-24 | Discharge: 2024-03-24 | Disposition: A | Discharge status: Home / Self Care | Source: Ambulatory Visit | Attending: Physician Assistant | Admitting: Physician Assistant

## 2024-03-24 ENCOUNTER — Encounter (HOSPITAL_COMMUNITY): Payer: Self-pay | Admitting: Physician Assistant

## 2024-03-24 VITALS — BP 128/70 | HR 80 | Ht 72.0 in | Wt 223.6 lb

## 2024-03-24 DIAGNOSIS — I4819 Other persistent atrial fibrillation: Secondary | ICD-10-CM

## 2024-03-24 DIAGNOSIS — E785 Hyperlipidemia, unspecified: Secondary | ICD-10-CM | POA: Insufficient documentation

## 2024-03-24 DIAGNOSIS — Z7901 Long term (current) use of anticoagulants: Secondary | ICD-10-CM | POA: Diagnosis not present

## 2024-03-24 DIAGNOSIS — I10 Essential (primary) hypertension: Secondary | ICD-10-CM | POA: Insufficient documentation

## 2024-03-24 DIAGNOSIS — Z5181 Encounter for therapeutic drug level monitoring: Secondary | ICD-10-CM | POA: Diagnosis not present

## 2024-03-24 DIAGNOSIS — Z79899 Other long term (current) drug therapy: Secondary | ICD-10-CM | POA: Insufficient documentation

## 2024-03-24 DIAGNOSIS — E119 Type 2 diabetes mellitus without complications: Secondary | ICD-10-CM | POA: Diagnosis not present

## 2024-03-24 DIAGNOSIS — Z7984 Long term (current) use of oral hypoglycemic drugs: Secondary | ICD-10-CM | POA: Insufficient documentation

## 2024-03-24 DIAGNOSIS — D6869 Other thrombophilia: Secondary | ICD-10-CM | POA: Diagnosis not present

## 2024-03-24 DIAGNOSIS — D649 Anemia, unspecified: Secondary | ICD-10-CM | POA: Insufficient documentation

## 2024-03-24 DIAGNOSIS — I4891 Unspecified atrial fibrillation: Secondary | ICD-10-CM | POA: Diagnosis present

## 2024-03-24 DIAGNOSIS — Z951 Presence of aortocoronary bypass graft: Secondary | ICD-10-CM | POA: Diagnosis not present

## 2024-03-24 DIAGNOSIS — I2510 Atherosclerotic heart disease of native coronary artery without angina pectoris: Secondary | ICD-10-CM | POA: Diagnosis not present

## 2024-03-24 LAB — BASIC METABOLIC PANEL WITH GFR
Anion gap: 13 (ref 5–15)
BUN: 11 mg/dL (ref 8–23)
CO2: 19 mmol/L — ABNORMAL LOW (ref 22–32)
Calcium: 9.1 mg/dL (ref 8.9–10.3)
Chloride: 105 mmol/L (ref 98–111)
Creatinine, Ser: 1.3 mg/dL — ABNORMAL HIGH (ref 0.61–1.24)
GFR, Estimated: 56 mL/min — ABNORMAL LOW (ref >60)
Glucose, Bld: 135 mg/dL — ABNORMAL HIGH (ref 70–99)
Potassium: 5.2 mmol/L — ABNORMAL HIGH (ref 3.5–5.1)
Sodium: 137 mmol/L (ref 135–145)

## 2024-03-24 LAB — CBC
HCT: 35.9 % — ABNORMAL LOW (ref 39.0–52.0)
Hemoglobin: 10.2 g/dL — ABNORMAL LOW (ref 13.0–17.0)
MCH: 22.9 pg — ABNORMAL LOW (ref 26.0–34.0)
MCHC: 28.4 g/dL — ABNORMAL LOW (ref 30.0–36.0)
MCV: 80.7 fL (ref 80.0–100.0)
Platelets: 278 10*3/uL (ref 150–400)
RBC: 4.45 MIL/uL (ref 4.22–5.81)
RDW: 18.8 % — ABNORMAL HIGH (ref 11.5–15.5)
WBC: 5.9 10*3/uL (ref 4.0–10.5)
nRBC: 0 % (ref 0.0–0.2)

## 2024-03-24 NOTE — Progress Notes (Addendum)
 Primary Care Physician: Center, [facility] Primary Cardiologist: Marjo Bicker, MD Electrophysiologist: None  Referring Physician: Dr Dannielle Burn is a 78 y.o. male with a history of CAD s/p CABG 2021, DM, HLD, HTN, atrial fibrillation who presents for follow up in the [facility] Atrial Fibrillation [facility]. Patient had postop A-fib after CABG in 2021 which resolved with amiodarone. He had another episode of A-fib for which he underwent DCCV in 11/52021 and 03/27/22.  His amiodarone was discontinued 08/06/23 to avoid possible long term side effects as he was maintaining SR. He presented to the ED 02/02/24 with chest pain and was found to be back in afib. He underwent DCCV at that time and his amiodarone was resumed. Seen by Cadence Fransico Michael 02/19/24 and patient was back in afib. His metoprolol was increased. Patient is on Eliquis for stroke prevention. Patient was set up for DCCV but his labs showed decline in his chronically low hgb 10.7 > 10.0 > 9.8 and his DCCV was cancelled pending evaluation. He was seen by his PCP and started on iron supplementation.   Patient returns for follow up for atrial fibrillation and amiodarone monitoring. He remains in rate controlled afib with symptoms of fatigue on exertion that wax and wane. He denies any dark, tarry stools or bright red blood.   Today, he  denies symptoms of palpitations, chest pain, shortness of breath, orthopnea, PND, lower extremity edema, dizziness, presyncope, syncope, snoring, daytime somnolence, bleeding, or neurologic sequela. The patient is tolerating medications without difficulties and is otherwise without complaint today.    Atrial Fibrillation Risk Factors:  he does not have symptoms or diagnosis of sleep apnea. he does not have a history of rheumatic fever.   Atrial Fibrillation Management history:  Previous antiarrhythmic drugs: amiodarone  Previous cardioversions: 10/27/20, 03/27/22, 02/02/24 Previous  ablations: none Anticoagulation history: Eliquis  ROS- All systems are reviewed and negative except as per the HPI above.  Past Medical History:  Diagnosis Date   Coronary artery disease    a. s/p CABG x 4 with LIMA-LAD, rSVG-D1, rSVG-OM1 and rSVG-OM3 and LAA clipping in 09/2020   Diabetes mellitus without complication (HCC)    GERD (gastroesophageal reflux disease)    HOH (hard of hearing)    AIDS   Hyperlipidemia    Hypertension    PAF (paroxysmal atrial fibrillation) (HCC)     Current Outpatient Medications  Medication Sig Dispense Refill   ACCU-CHEK AVIVA PLUS test strip 1 each by Other route as needed for other.     amiodarone (PACERONE) 200 MG tablet Take 1 tablet (200 mg total) by mouth 2 (two) times daily. 60 tablet 1   apixaban (ELIQUIS) 5 MG TABS tablet Take 1 tablet (5 mg total) by mouth in the morning and at bedtime. 28 tablet 0   diltiazem (CARDIZEM) 30 MG tablet Take 1 tablet (30 mg total) by mouth every 8 (eight) hours as needed. 270 tablet 3   docusate sodium (COLACE) 100 MG capsule Take 100 mg by mouth daily.     empagliflozin (JARDIANCE) 10 MG TABS tablet Take 10 mg by mouth daily.     ezetimibe (ZETIA) 10 MG tablet TAKE 1 TABLET BY MOUTH EVERY DAY 90 tablet 2   FEROSUL 325 (65 Fe) MG tablet Take 325 mg by mouth daily with breakfast.     furosemide (LASIX) 20 MG tablet Take 1 tablet (20 mg total) by mouth daily as needed for fluid or edema. 30 tablet  6   losartan (COZAAR) 25 MG tablet Take 25 mg by mouth daily.     metFORMIN (GLUCOPHAGE) 1000 MG tablet Take 1,000 mg by mouth 2 (two) times daily.  4   metoprolol succinate (TOPROL-XL) 50 MG 24 hr tablet Take 1.5 tablets (75 mg total) by mouth daily. Take with or immediately following a meal. 135 tablet 3   omeprazole (PRILOSEC) 20 MG capsule Take 20 mg by mouth daily.  3   rosuvastatin (CRESTOR) 20 MG tablet Take 1 tablet (20 mg total) by mouth daily. 90 tablet 3   tamsulosin (FLOMAX) 0.4 MG CAPS capsule Take 0.8 mg  by mouth daily.     TRADJENTA 5 MG TABS tablet Take 5 mg by mouth daily.     No current facility-administered medications for this encounter.    Physical Exam: BP 128/70   Pulse 80   Ht 6' (1.829 m)   Wt 101.4 kg   BMI 30.33 kg/m   GEN: Well nourished, well developed in no acute distress CARDIAC: Irregularly irregular rate and rhythm, no murmurs, rubs, gallops RESPIRATORY:  Clear to auscultation without rales, wheezing or rhonchi  ABDOMEN: Soft, non-tender, non-distended EXTREMITIES:  No edema; No deformity    Wt Readings from Last 3 Encounters:  03/24/24 101.4 kg  03/01/24 102 kg  02/19/24 104.1 kg     EKG today demonstrates  Afib Vent. rate 80 BPM PR interval * ms QRS duration 96 ms QT/QTcB 396/456 ms   Echo 03/04/24 demonstrated   1. Left ventricular ejection fraction, by estimation, is 55 to 60%. The  left ventricle has normal function. Left ventricular endocardial border  not optimally defined to evaluate regional wall motion. There is mild left  ventricular hypertrophy. Left ventricular diastolic function could not be evaluated.   2. Right ventricular systolic function is normal. The right ventricular  size is normal. There is normal pulmonary artery systolic pressure.   3. Left atrial size was mildly dilated.   4. Right atrial size was moderately dilated.   5. The mitral valve is normal in structure. No evidence of mitral valve  regurgitation. No evidence of mitral stenosis.   6. The aortic valve is tricuspid. There is mild calcification of the  aortic valve. Aortic valve regurgitation is not visualized. No aortic  stenosis is present.   7. The inferior vena cava is dilated in size with >50% respiratory  variability, suggesting right atrial pressure of 8 mmHg.   Comparison(s): No significant change from prior study.    CHA2DS2-VASc Score = 5  The patient's score is based upon: CHF History: 0 HTN History: 1 Diabetes History: 1 Stroke History:  0 Vascular Disease History: 1 Age Score: 2 Gender Score: 0       ASSESSMENT AND PLAN: Persistent Atrial Fibrillation (ICD10:  I48.19) The patient's CHA2DS2-VASc score is 5, indicating a 7.2% annual risk of stroke.   DCCV was canceled due to worsening chronic anemia. Started on iron supplementation. Will recheck cbc/bmet today and if improved, will arrange for DCCV.  Continue amiodarone 200 mg BID for now. Will decrease back to once daily after DCCV.  Continue Toprol 75 mg daily Continue Eliquis 5 mg BID If he fails to maintain SR with amiodarone, he would consider ablation.   Secondary Hypercoagulable State (ICD10:  D68.69) The patient is at significant risk for stroke/thromboembolism based upon his CHA2DS2-VASc Score of 5.  Continue Apixaban (Eliquis).   High Risk Medication Monitoring (ICD 10: Z79.899) Intervals on ECG acceptable for amiodarone  monitoring.   CAD S/p CABG 2021 No anginal symptoms Followed by Dr Jenene Slicker  HTN Stable on current regimen  Anemia Hgb 9.3 at PCP 3/14 Started on iron supplementation He is pending GI evaluation No dark stools or bright red blood.  Recheck cbc today.     Follow up in the AF clinic post DCCV.    Informed Consent   Shared Decision Making/Informed Consent The risks (stroke, cardiac arrhythmias rarely resulting in the need for a temporary or permanent pacemaker, skin irritation or burns and complications associated with conscious sedation including aspiration, arrhythmia, respiratory failure and death), benefits (restoration of normal sinus rhythm) and alternatives of a direct current cardioversion were explained in detail to Mr. Kerney and he agrees to proceed.        Jorja Loa PA-C [facility] [address] [place], [place] [address] [phone]

## 2024-03-24 NOTE — H&P (View-Only) (Signed)
 Primary Care Physician: Center, [facility] Primary Cardiologist: Marjo Bicker, MD Electrophysiologist: None  Referring Physician: Dr Dannielle Burn is a 78 y.o. male with a history of CAD s/p CABG 2021, DM, HLD, HTN, atrial fibrillation who presents for follow up in the [facility] At[facility]ibr[facility]. Patient had postop A-fib after CABG in 2021 which resolved with amiodarone. He had another episode of A-fib for which he underwent DCCV in 11/52021 and 03/27/22.  His amiodarone was discontinued 08/06/23 to avoid possible long term side effects as he was maintaining SR. He presented to the ED 02/02/24 with chest pain and was found to be back in afib. He underwent DCCV at that time and his amiodarone was resumed. Seen by Cadence Fransico Michael 02/19/24 and patient was back in afib. His metoprolol was increased. Patient is on Eliquis for stroke prevention. Patient was set up for DCCV but his labs showed decline in his chronically low hgb 10.7 > 10.0 > 9.8 and his DCCV was cancelled pending evaluation. He was seen by his PCP and started on iron supplementation.   Patient returns for follow up for atrial fibrillation and amiodarone monitoring. He remains in rate controlled afib with symptoms of fatigue on exertion that wax and wane. He denies any dark, tarry stools or bright red blood.   Today, he  denies symptoms of palpitations, chest pain, shortness of breath, orthopnea, PND, lower extremity edema, dizziness, presyncope, syncope, snoring, daytime somnolence, bleeding, or neurologic sequela. The patient is tolerating medications without difficulties and is otherwise without complaint today.    Atrial Fibrillation Risk Factors:  he does not have symptoms or diagnosis of sleep apnea. he does not have a history of rheumatic fever.   Atrial Fibrillation Management history:  Previous antiarrhythmic drugs: amiodarone  Previous cardioversions: 10/27/20, 03/27/22, 02/02/24 Previous  ablations: none Anticoagulation history: Eliquis  ROS- All systems are reviewed and negative except as per the HPI above.  Past Medical History:  Diagnosis Date   Coronary artery disease    a. s/p CABG x 4 with LIMA-LAD, rSVG-D1, rSVG-OM1 and rSVG-OM3 and LAA clipping in 09/2020   Diabetes mellitus without complication (HCC)    GERD (gastroesophageal reflux disease)    HOH (hard of hearing)    AIDS   Hyperlipidemia    Hypertension    PAF (paroxysmal atrial fibrillation) (HCC)     Current Outpatient Medications  Medication Sig Dispense Refill   ACCU-CHEK AVIVA PLUS test strip 1 each by Other route as needed for other.     amiodarone (PACERONE) 200 MG tablet Take 1 tablet (200 mg total) by mouth 2 (two) times daily. 60 tablet 1   apixaban (ELIQUIS) 5 MG TABS tablet Take 1 tablet (5 mg total) by mouth in the morning and at bedtime. 28 tablet 0   diltiazem (CARDIZEM) 30 MG tablet Take 1 tablet (30 mg total) by mouth every 8 (eight) hours as needed. 270 tablet 3   docusate sodium (COLACE) 100 MG capsule Take 100 mg by mouth daily.     empagliflozin (JARDIANCE) 10 MG TABS tablet Take 10 mg by mouth daily.     ezetimibe (ZETIA) 10 MG tablet TAKE 1 TABLET BY MOUTH EVERY DAY 90 tablet 2   FEROSUL 325 (65 Fe) MG tablet Take 325 mg by mouth daily with breakfast.     furosemide (LASIX) 20 MG tablet Take 1 tablet (20 mg total) by mouth daily as needed for fluid or edema. 30 tablet  6   losartan (COZAAR) 25 MG tablet Take 25 mg by mouth daily.     metFORMIN (GLUCOPHAGE) 1000 MG tablet Take 1,000 mg by mouth 2 (two) times daily.  4   metoprolol succinate (TOPROL-XL) 50 MG 24 hr tablet Take 1.5 tablets (75 mg total) by mouth daily. Take with or immediately following a meal. 135 tablet 3   omeprazole (PRILOSEC) 20 MG capsule Take 20 mg by mouth daily.  3   rosuvastatin (CRESTOR) 20 MG tablet Take 1 tablet (20 mg total) by mouth daily. 90 tablet 3   tamsulosin (FLOMAX) 0.4 MG CAPS capsule Take 0.8 mg  by mouth daily.     TRADJENTA 5 MG TABS tablet Take 5 mg by mouth daily.     No current facility-administered medications for this encounter.    Physical Exam: BP 128/70   Pulse 80   Ht 6' (1.829 m)   Wt 101.4 kg   BMI 30.33 kg/m   GEN: Well nourished, well developed in no acute distress CARDIAC: Irregularly irregular rate and rhythm, no murmurs, rubs, gallops RESPIRATORY:  Clear to auscultation without rales, wheezing or rhonchi  ABDOMEN: Soft, non-tender, non-distended EXTREMITIES:  No edema; No deformity    Wt Readings from Last 3 Encounters:  03/24/24 101.4 kg  03/01/24 102 kg  02/19/24 104.1 kg     EKG today demonstrates  Afib Vent. rate 80 BPM PR interval * ms QRS duration 96 ms QT/QTcB 396/456 ms   Echo 03/04/24 demonstrated   1. Left ventricular ejection fraction, by estimation, is 55 to 60%. The  left ventricle has normal function. Left ventricular endocardial border  not optimally defined to evaluate regional wall motion. There is mild left  ventricular hypertrophy. Left ventricular diastolic function could not be evaluated.   2. Right ventricular systolic function is normal. The right ventricular  size is normal. There is normal pulmonary artery systolic pressure.   3. Left atrial size was mildly dilated.   4. Right atrial size was moderately dilated.   5. The mitral valve is normal in structure. No evidence of mitral valve  regurgitation. No evidence of mitral stenosis.   6. The aortic valve is tricuspid. There is mild calcification of the  aortic valve. Aortic valve regurgitation is not visualized. No aortic  stenosis is present.   7. The inferior vena cava is dilated in size with >50% respiratory  variability, suggesting right atrial pressure of 8 mmHg.   Comparison(s): No significant change from prior study.    CHA2DS2-VASc Score = 5  The patient's score is based upon: CHF History: 0 HTN History: 1 Diabetes History: 1 Stroke History:  0 Vascular Disease History: 1 Age Score: 2 Gender Score: 0       ASSESSMENT AND PLAN: Persistent Atrial Fibrillation (ICD10:  I48.19) The patient's CHA2DS2-VASc score is 5, indicating a 7.2% annual risk of stroke.   DCCV was canceled due to worsening chronic anemia. Started on iron supplementation. Will recheck cbc/bmet today and if improved, will arrange for DCCV.  Continue amiodarone 200 mg BID for now. Will decrease back to once daily after DCCV.  Continue Toprol 75 mg daily Continue Eliquis 5 mg BID If he fails to maintain SR with amiodarone, he would consider ablation.   Secondary Hypercoagulable State (ICD10:  D68.69) The patient is at significant risk for stroke/thromboembolism based upon his CHA2DS2-VASc Score of 5.  Continue Apixaban (Eliquis).   High Risk Medication Monitoring (ICD 10: Z79.899) Intervals on ECG acceptable for amiodarone  monitoring.   CAD S/p CABG 2021 No anginal symptoms Followed by Dr Jenene Slicker  HTN Stable on current regimen  Anemia Hgb 9.3 at PCP 3/14 Started on iron supplementation He is pending GI evaluation No dark stools or bright red blood.  Recheck cbc today.     Follow up in the AF clinic post DCCV.    Informed Consent   Shared Decision Making/Informed Consent The risks (stroke, cardiac arrhythmias rarely resulting in the need for a temporary or permanent pacemaker, skin irritation or burns and complications associated with conscious sedation including aspiration, arrhythmia, respiratory failure and death), benefits (restoration of normal sinus rhythm) and alternatives of a direct current cardioversion were explained in detail to Mr. Kerney and he agrees to proceed.        Jorja Loa PA-C [facility] [address] [place], [place] [address] [phone]

## 2024-03-29 NOTE — Progress Notes (Signed)
 Called patient with pre-procedure instructions for tomorrow.   Patient informed of:   Time to arrive for procedure. 0845 Remain NPO past midnight.  Must have a ride home and a responsible adult to remain with them for 24 hours post procedure.  Confirmed blood thinner.Eliquis Confirmed no breaks in taking blood thinner for 3+ weeks prior to procedure. Confirmed patient stopped all GLP-1s and GLP-2s for at least one week before procedure. Last Jardiance dose 03/26/24

## 2024-03-30 ENCOUNTER — Ambulatory Visit (HOSPITAL_BASED_OUTPATIENT_CLINIC_OR_DEPARTMENT_OTHER): Admitting: Anesthesiology

## 2024-03-30 ENCOUNTER — Ambulatory Visit (HOSPITAL_COMMUNITY)
Admit: 2024-03-30 | Discharge: 2024-03-30 | Disposition: A | Discharge status: Home / Self Care | Source: Ambulatory Visit | Attending: Cardiology | Admitting: Cardiology

## 2024-03-30 ENCOUNTER — Ambulatory Visit (HOSPITAL_COMMUNITY): Admitting: Anesthesiology

## 2024-03-30 ENCOUNTER — Other Ambulatory Visit: Payer: Self-pay

## 2024-03-30 ENCOUNTER — Encounter (HOSPITAL_COMMUNITY): Payer: Self-pay | Admitting: Cardiology

## 2024-03-30 ENCOUNTER — Encounter (HOSPITAL_COMMUNITY): Disposition: A | Discharge status: Home / Self Care | Payer: Self-pay | Source: Ambulatory Visit | Attending: Cardiology

## 2024-03-30 DIAGNOSIS — Z79899 Other long term (current) drug therapy: Secondary | ICD-10-CM | POA: Diagnosis not present

## 2024-03-30 DIAGNOSIS — I4819 Other persistent atrial fibrillation: Secondary | ICD-10-CM | POA: Insufficient documentation

## 2024-03-30 DIAGNOSIS — Z006 Encounter for examination for normal comparison and control in clinical research program: Secondary | ICD-10-CM

## 2024-03-30 DIAGNOSIS — E119 Type 2 diabetes mellitus without complications: Secondary | ICD-10-CM | POA: Diagnosis not present

## 2024-03-30 DIAGNOSIS — D6869 Other thrombophilia: Secondary | ICD-10-CM | POA: Diagnosis not present

## 2024-03-30 DIAGNOSIS — Z87891 Personal history of nicotine dependence: Secondary | ICD-10-CM | POA: Insufficient documentation

## 2024-03-30 DIAGNOSIS — I10 Essential (primary) hypertension: Secondary | ICD-10-CM

## 2024-03-30 DIAGNOSIS — Z951 Presence of aortocoronary bypass graft: Secondary | ICD-10-CM | POA: Diagnosis not present

## 2024-03-30 DIAGNOSIS — I4891 Unspecified atrial fibrillation: Secondary | ICD-10-CM

## 2024-03-30 DIAGNOSIS — I252 Old myocardial infarction: Secondary | ICD-10-CM | POA: Diagnosis not present

## 2024-03-30 DIAGNOSIS — K219 Gastro-esophageal reflux disease without esophagitis: Secondary | ICD-10-CM | POA: Diagnosis not present

## 2024-03-30 DIAGNOSIS — I2510 Atherosclerotic heart disease of native coronary artery without angina pectoris: Secondary | ICD-10-CM | POA: Insufficient documentation

## 2024-03-30 DIAGNOSIS — D649 Anemia, unspecified: Secondary | ICD-10-CM | POA: Insufficient documentation

## 2024-03-30 DIAGNOSIS — Z7901 Long term (current) use of anticoagulants: Secondary | ICD-10-CM | POA: Diagnosis not present

## 2024-03-30 DIAGNOSIS — E785 Hyperlipidemia, unspecified: Secondary | ICD-10-CM | POA: Diagnosis not present

## 2024-03-30 DIAGNOSIS — Z7984 Long term (current) use of oral hypoglycemic drugs: Secondary | ICD-10-CM | POA: Diagnosis not present

## 2024-03-30 LAB — POCT I-STAT, CHEM 8
BUN: 12 mg/dL (ref 8–23)
Calcium, Ion: 1.18 mmol/L (ref 1.15–1.40)
Chloride: 106 mmol/L (ref 98–111)
Creatinine, Ser: 1 mg/dL (ref 0.61–1.24)
Glucose, Bld: 121 mg/dL — ABNORMAL HIGH (ref 70–99)
HCT: 34 % — ABNORMAL LOW (ref 39.0–52.0)
Hemoglobin: 11.6 g/dL — ABNORMAL LOW (ref 13.0–17.0)
Potassium: 4.4 mmol/L (ref 3.5–5.1)
Sodium: 139 mmol/L (ref 135–145)
TCO2: 22 mmol/L (ref 22–32)

## 2024-03-30 SURGERY — CARDIOVERSION (CATH LAB)
Anesthesia: General

## 2024-03-30 MED ORDER — SODIUM CHLORIDE 0.9% FLUSH
3.0000 mL | Freq: Two times a day (BID) | INTRAVENOUS | Status: DC
Start: 2024-03-30 — End: 2024-03-30

## 2024-03-30 MED ORDER — SODIUM CHLORIDE 0.9% FLUSH
3.0000 mL | INTRAVENOUS | Status: DC | PRN
Start: 2024-03-30 — End: 2024-03-30

## 2024-03-30 MED ORDER — LIDOCAINE 2% (20 MG/ML) 5 ML SYRINGE
INTRAMUSCULAR | Status: DC | PRN
  Administered 2024-03-30: 40 mg via INTRAVENOUS

## 2024-03-30 MED ORDER — PROPOFOL 10 MG/ML IV BOLUS
INTRAVENOUS | Status: DC | PRN
  Administered 2024-03-30: 50 mg via INTRAVENOUS

## 2024-03-30 SURGICAL SUPPLY — 1 items: PAD DEFIB RADIO PHYSIO CONN (PAD) ×1 IMPLANT

## 2024-03-30 NOTE — Anesthesia Preprocedure Evaluation (Addendum)
 Anesthesia Evaluation  Patient identified by MRN, date of birth, ID band Patient awake    Reviewed: Allergy & Precautions, NPO status , Patient's Chart, lab work & pertinent test results  Airway Mallampati: II  TM Distance: >3 FB Neck ROM: Full    Dental  (+) Teeth Intact, Dental Advisory Given   Pulmonary former smoker   breath sounds clear to auscultation       Cardiovascular hypertension, + CAD, + Past MI and + CABG  + dysrhythmias Atrial Fibrillation (-) pacemaker Rhythm:Irregular Rate:Normal  Echo:   1. Left ventricular ejection fraction, by estimation, is 55 to 60%. The  left ventricle has normal function. Left ventricular endocardial border  not optimally defined to evaluate regional wall motion. There is mild left  ventricular hypertrophy. Left  ventricular diastolic function could not be evaluated.   2. Right ventricular systolic function is normal. The right ventricular  size is normal. There is normal pulmonary artery systolic pressure.   3. Left atrial size was mildly dilated.   4. Right atrial size was moderately dilated.   5. The mitral valve is normal in structure. No evidence of mitral valve  regurgitation. No evidence of mitral stenosis.   6. The aortic valve is tricuspid. There is mild calcification of the  aortic valve. Aortic valve regurgitation is not visualized. No aortic  stenosis is present.   7. The inferior vena cava is dilated in size with >50% respiratory  variability, suggesting right atrial pressure of 8 mmHg.     Neuro/Psych negative neurological ROS  negative psych ROS   GI/Hepatic Neg liver ROS,GERD  ,,  Endo/Other  diabetes    Renal/GU negative Renal ROS     Musculoskeletal negative musculoskeletal ROS (+)    Abdominal   Peds  Hematology negative hematology ROS (+)   Anesthesia Other Findings   Reproductive/Obstetrics                              Anesthesia Physical Anesthesia Plan  ASA: 3  Anesthesia Plan: General   Post-op Pain Management: Minimal or no pain anticipated   Induction: Intravenous  PONV Risk Score and Plan: 0 and Propofol infusion  Airway Management Planned: Natural Airway and Simple Face Mask  Additional Equipment: None  Intra-op Plan:   Post-operative Plan:   Informed Consent: I have reviewed the patients History and Physical, chart, labs and discussed the procedure including the risks, benefits and alternatives for the proposed anesthesia with the patient or authorized representative who has indicated his/her understanding and acceptance.       Plan Discussed with: CRNA  Anesthesia Plan Comments:        Anesthesia Quick Evaluation

## 2024-03-30 NOTE — Research (Signed)
 [facility] Cardioversion Informed Consent   Subject Name: Herbert Torres  Subject met inclusion and exclusion criteria.  The informed consent form, study requirements and expectations were reviewed with the subject and questions and concerns were addressed prior to the signing of the consent form.  The subject verbalized understanding of the trial requirements.  The subject agreed to participate in the [facility] Cardioversion trial and signed the informed consent at 0848 on 08/Apr/2025.  The informed consent was obtained prior to performance of any protocol-specific procedures for the subject.  A copy of the signed informed consent was given to the subject and a copy was placed in the subject's medical record.   Herbert Torres

## 2024-03-30 NOTE — CV Procedure (Signed)
   Electrical Cardioversion Procedure Note Herbert Torres [id number] 08/25/1946  Procedure: Electrical Cardioversion Indications:  Atrial Fibrillation  Time Out: Verified patient identification, verified procedure,medications/allergies/relevent history reviewed, required imaging and test results available.  Performed  Procedure Details  The patient was NPO after midnight. Anesthesia was administered at the beside  by Dr.Hollis with l.  Cardioversion was done with synchronized biphasic defibrillation with AP pads with 200 Joules x1.  The patient converted to normal sinus rhythm. The patient tolerated the procedure well   IMPRESSION:  Successful cardioversion of atrial fibrillation    Herbert Torres 03/30/2024, 10:30 AM

## 2024-03-30 NOTE — Anesthesia Postprocedure Evaluation (Signed)
 Anesthesia Post Note  Patient: DANZIG MACGREGOR  Procedure(s) Performed: CARDIOVERSION     Patient location during evaluation: PACU Anesthesia Type: General Level of consciousness: awake and alert Pain management: pain level controlled Vital Signs Assessment: post-procedure vital signs reviewed and stable Respiratory status: spontaneous breathing, nonlabored ventilation, respiratory function stable and patient connected to nasal cannula oxygen Cardiovascular status: stable and blood pressure returned to baseline Postop Assessment: no apparent nausea or vomiting Anesthetic complications: no  There were no known notable events for this encounter.  Last Vitals:  Vitals:   03/30/24 1050 03/30/24 1055  BP: 131/72 126/73  Pulse: 71 72  Resp: 16 18  Temp:    SpO2: 95% 96%    Last Pain:  Vitals:   03/30/24 1055  TempSrc:   PainSc: 0-No pain                 Shelton Silvas

## 2024-03-30 NOTE — Transfer of Care (Signed)
 Immediate Anesthesia Transfer of Care Note  Patient: Herbert Torres  Procedure(s) Performed: CARDIOVERSION  Patient Location: Cath Lab  Anesthesia Type:MAC  Level of Consciousness: awake, alert , and oriented  Airway & Oxygen Therapy: Patient Spontanous Breathing and Patient connected to nasal cannula oxygen  Post-op Assessment: Report given to RN and Post -op Vital signs reviewed and stable  Post vital signs: Reviewed and stable  Last Vitals:  Vitals Value Taken Time  BP 139/83 03/30/24 1015  Temp    Pulse 92 03/30/24 1017  Resp 21 03/30/24 1017  SpO2 92 % 03/30/24 1017  Vitals shown include unfiled device data.  Last Pain:  Vitals:   03/30/24 0952  TempSrc:   PainSc: 0-No pain         Complications: No notable events documented.

## 2024-03-30 NOTE — Interval H&P Note (Signed)
 History and Physical Interval Note:  03/30/2024 9:13 AM  Herbert Torres  has presented today for surgery, with the diagnosis of AFIB.  The various methods of treatment have been discussed with the patient and family. After consideration of risks, benefits and other options for treatment, the patient has consented to  Procedure(s): CARDIOVERSION (N/A) as a surgical intervention.  The patient's history has been reviewed, patient examined, no change in status, stable for surgery.  I have reviewed the patient's chart and labs.  Questions were answered to the patient's satisfaction.     Tilmon Wisehart

## 2024-04-13 ENCOUNTER — Encounter (HOSPITAL_COMMUNITY): Payer: Self-pay | Admitting: Physician Assistant

## 2024-04-13 ENCOUNTER — Ambulatory Visit (HOSPITAL_COMMUNITY)
Admission: RE | Admit: 2024-04-13 | Discharge: 2024-04-13 | Disposition: A | Discharge status: Home / Self Care | Source: Ambulatory Visit | Attending: Physician Assistant | Admitting: Physician Assistant

## 2024-04-13 VITALS — BP 142/62 | HR 62 | Ht 72.0 in | Wt 223.8 lb

## 2024-04-13 DIAGNOSIS — Z951 Presence of aortocoronary bypass graft: Secondary | ICD-10-CM | POA: Diagnosis not present

## 2024-04-13 DIAGNOSIS — E119 Type 2 diabetes mellitus without complications: Secondary | ICD-10-CM | POA: Diagnosis not present

## 2024-04-13 DIAGNOSIS — I10 Essential (primary) hypertension: Secondary | ICD-10-CM | POA: Diagnosis not present

## 2024-04-13 DIAGNOSIS — Z5181 Encounter for therapeutic drug level monitoring: Secondary | ICD-10-CM | POA: Diagnosis not present

## 2024-04-13 DIAGNOSIS — D6869 Other thrombophilia: Secondary | ICD-10-CM

## 2024-04-13 DIAGNOSIS — Z7984 Long term (current) use of oral hypoglycemic drugs: Secondary | ICD-10-CM | POA: Diagnosis not present

## 2024-04-13 DIAGNOSIS — Z7901 Long term (current) use of anticoagulants: Secondary | ICD-10-CM | POA: Diagnosis not present

## 2024-04-13 DIAGNOSIS — Z79899 Other long term (current) drug therapy: Secondary | ICD-10-CM | POA: Diagnosis not present

## 2024-04-13 DIAGNOSIS — I4819 Other persistent atrial fibrillation: Secondary | ICD-10-CM | POA: Diagnosis present

## 2024-04-13 DIAGNOSIS — I2510 Atherosclerotic heart disease of native coronary artery without angina pectoris: Secondary | ICD-10-CM | POA: Insufficient documentation

## 2024-04-13 MED ORDER — AMIODARONE HCL 200 MG PO TABS: 200.0000 mg | ORAL_TABLET | Freq: Every day | ORAL | 2 refills | Status: AC

## 2024-04-13 NOTE — Progress Notes (Signed)
 Primary Care Physician: [facility], [facility] Primary Cardiologist: Vishnu P Mallipeddi, MD Electrophysiologist: None  Referring Physician: Dr Duncan Gibson is a 78 y.o. male with a history of CAD s/p CABG 2021, DM, HLD, HTN, atrial fibrillation who presents for follow up in the [facility]rial [facility]. Patient had postop A-fib after CABG in 2021 which resolved with amiodarone . He had another episode of A-fib for which he underwent DCCV in 11/52021 and 03/27/22.  His amiodarone  was discontinued 08/06/23 to avoid possible long term side effects as he was maintaining SR. He presented to the ED 02/02/24 with chest pain and was found to be back in afib. He underwent DCCV at that time and his amiodarone  was resumed. Seen by Cadence Gennaro Khat 02/19/24 and patient was back in afib. His metoprolol  was increased. Patient is on Eliquis  for stroke prevention. Patient was set up for DCCV but his labs showed decline in his chronically low hgb 10.7 > 10.0 > 9.8 and his DCCV was cancelled pending evaluation. He was seen by his PCP and started on iron supplementation.   Patient returns for follow up for atrial fibrillation. He is s/p DCCV on 03/30/24. He reports that he feels "so much better". His energy and strength have improved. His hgb has improved to 11.6 the day of DCCV. No overt bleeding issues on anticoagulation.   Today, he  denies symptoms of palpitations, chest pain, shortness of breath, orthopnea, PND, lower extremity edema, dizziness, presyncope, syncope, snoring, daytime somnolence, bleeding, or neurologic sequela. The patient is tolerating medications without difficulties and is otherwise without complaint today.    Atrial Fibrillation Risk Factors:  he does not have symptoms or diagnosis of sleep apnea. he does not have a history of rheumatic fever.   Atrial Fibrillation Management history:  Previous antiarrhythmic drugs: amiodarone   Previous cardioversions:  10/27/20, 03/27/22, 02/02/24, 03/30/24 Previous ablations: none Anticoagulation history: Eliquis   ROS- All systems are reviewed and negative except as per the HPI above.  Past Medical History:  Diagnosis Date   Coronary artery disease    a. s/p CABG x 4 with LIMA-LAD, rSVG-D1, rSVG-OM1 and rSVG-OM3 and LAA clipping in 09/2020   Diabetes mellitus without complication (HCC)    GERD (gastroesophageal reflux disease)    HOH (hard of hearing)    AIDS   Hyperlipidemia    Hypertension    PAF (paroxysmal atrial fibrillation) (HCC)     Current Outpatient Medications  Medication Sig Dispense Refill   ACCU-CHEK AVIVA PLUS test strip 1 each by Other route as needed for other.     amiodarone  (PACERONE ) 200 MG tablet Take 1 tablet (200 mg total) by mouth 2 (two) times daily. 60 tablet 1   apixaban  (ELIQUIS ) 5 MG TABS tablet Take 1 tablet (5 mg total) by mouth in the morning and at bedtime. 28 tablet 0   diltiazem  (CARDIZEM ) 30 MG tablet Take 1 tablet (30 mg total) by mouth every 8 (eight) hours as needed. 270 tablet 3   docusate sodium  (COLACE) 100 MG capsule Take 100 mg by mouth daily.     empagliflozin (JARDIANCE) 10 MG TABS tablet Take 10 mg by mouth daily.     ezetimibe  (ZETIA ) 10 MG tablet TAKE 1 TABLET BY MOUTH EVERY DAY 90 tablet 2   FEROSUL 325 (65 Fe) MG tablet Take 325 mg by mouth daily with breakfast.     furosemide  (LASIX ) 20 MG tablet Take 1 tablet (20 mg total) by mouth daily  as needed for fluid or edema. 30 tablet 6   losartan (COZAAR) 25 MG tablet Take 25 mg by mouth daily.     metFORMIN  (GLUCOPHAGE ) 1000 MG tablet Take 1,000 mg by mouth 2 (two) times daily.  4   metoprolol  succinate (TOPROL -XL) 50 MG 24 hr tablet Take 1.5 tablets (75 mg total) by mouth daily. Take with or immediately following a meal. 135 tablet 3   omeprazole (PRILOSEC) 20 MG capsule Take 20 mg by mouth daily.  3   rosuvastatin  (CRESTOR ) 20 MG tablet Take 1 tablet (20 mg total) by mouth daily. 90 tablet 3    tamsulosin (FLOMAX) 0.4 MG CAPS capsule Take 0.8 mg by mouth daily.     TRADJENTA 5 MG TABS tablet Take 5 mg by mouth daily.     No current facility-administered medications for this encounter.    Physical Exam: BP (!) 142/62   Pulse 62   Ht 6' (1.829 m)   Wt 101.5 kg   BMI 30.35 kg/m   GEN: Well nourished, well developed in no acute distress CARDIAC: Regular rate and rhythm, no murmurs, rubs, gallops RESPIRATORY:  Clear to auscultation without rales, wheezing or rhonchi  ABDOMEN: Soft, non-tender, non-distended EXTREMITIES:  No edema; No deformity    Wt Readings from Last 3 Encounters:  04/13/24 101.5 kg  03/24/24 101.4 kg  03/01/24 102 kg     EKG today demonstrates  SR Vent. rate 62 BPM PR interval 198 ms QRS duration 88 ms QT/QTcB 488/495 ms   Echo 03/04/24 demonstrated   1. Left ventricular ejection fraction, by estimation, is 55 to 60%. The  left ventricle has normal function. Left ventricular endocardial border  not optimally defined to evaluate regional wall motion. There is mild left  ventricular hypertrophy. Left ventricular diastolic function could not be evaluated.   2. Right ventricular systolic function is normal. The right ventricular  size is normal. There is normal pulmonary artery systolic pressure.   3. Left atrial size was mildly dilated.   4. Right atrial size was moderately dilated.   5. The mitral valve is normal in structure. No evidence of mitral valve  regurgitation. No evidence of mitral stenosis.   6. The aortic valve is tricuspid. There is mild calcification of the  aortic valve. Aortic valve regurgitation is not visualized. No aortic  stenosis is present.   7. The inferior vena cava is dilated in size with >50% respiratory  variability, suggesting right atrial pressure of 8 mmHg.   Comparison(s): No significant change from prior study.    CHA2DS2-VASc Score = 5  The patient's score is based upon: CHF History: 0 HTN History:  1 Diabetes History: 1 Stroke History: 0 Vascular Disease History: 1 Age Score: 2 Gender Score: 0       ASSESSMENT AND PLAN: Persistent Atrial Fibrillation (ICD10:  I48.19) The patient's CHA2DS2-VASc score is 5, indicating a 7.2% annual risk of stroke.   S/p DCCV 03/30/24 Patient appears to be maintaining SR Continue amiodarone  200 mg once daily Continue Toprol  75 mg daily Continue Eliquis  5 mg BID If he fails to maintain SR with amiodarone , he would consider ablation.   Secondary Hypercoagulable State (ICD10:  D68.69) The patient is at significant risk for stroke/thromboembolism based upon his CHA2DS2-VASc Score of 5.  Continue Apixaban  (Eliquis ). No bleeding issues.   High Risk Medication Monitoring (ICD 10: Z79.899) Intervals on ECG acceptable for amiodarone  monitoring. Check cmet/TSH at follow up.   CAD S/p CABG 2021 No anginal  symptoms Followed by Dr Arthea Larsson  HTN Stable on current regimen    Follow up in the AF clinic in 6 months.     Myrtha Ates PA-C Afib [facility] [address] [place], [place] [address] [phone]

## 2024-04-14 ENCOUNTER — Ambulatory Visit (HOSPITAL_COMMUNITY): Admitting: Internal Medicine

## 2024-04-14 ENCOUNTER — Encounter: Payer: Self-pay | Admitting: Internal Medicine

## 2024-04-14 ENCOUNTER — Other Ambulatory Visit: Payer: Self-pay

## 2024-04-14 MED ORDER — ROSUVASTATIN CALCIUM 20 MG PO TABS: 20.0000 mg | ORAL_TABLET | Freq: Every day | ORAL | 3 refills | Status: AC

## 2024-04-15 ENCOUNTER — Other Ambulatory Visit: Payer: Self-pay | Admitting: Medical

## 2024-05-27 ENCOUNTER — Other Ambulatory Visit (HOSPITAL_COMMUNITY)
Admission: RE | Admit: 2024-05-27 | Discharge: 2024-05-27 | Disposition: A | Discharge status: Home / Self Care | Source: Ambulatory Visit | Attending: Internal Medicine | Admitting: Internal Medicine

## 2024-05-27 ENCOUNTER — Ambulatory Visit: Payer: Medicare Other | Attending: Internal Medicine | Admitting: Internal Medicine

## 2024-05-27 ENCOUNTER — Encounter: Payer: Self-pay | Admitting: Internal Medicine

## 2024-05-27 ENCOUNTER — Ambulatory Visit: Payer: Self-pay | Admitting: Internal Medicine

## 2024-05-27 VITALS — BP 118/64 | HR 62 | Ht 72.0 in | Wt 223.0 lb

## 2024-05-27 DIAGNOSIS — E782 Mixed hyperlipidemia: Secondary | ICD-10-CM | POA: Insufficient documentation

## 2024-05-27 DIAGNOSIS — Z9229 Personal history of other drug therapy: Secondary | ICD-10-CM | POA: Diagnosis not present

## 2024-05-27 LAB — LIPID PANEL
Cholesterol: 116 mg/dL (ref 0–200)
HDL: 57 mg/dL (ref >40)
LDL Cholesterol: 43 mg/dL (ref 0–99)
Total CHOL/HDL Ratio: 2 ratio
Triglycerides: 78 mg/dL (ref <150)
VLDL: 16 mg/dL (ref 0–40)

## 2024-05-27 MED ORDER — METOPROLOL SUCCINATE ER 50 MG PO TB24: 75.0000 mg | ORAL_TABLET | Freq: Every day | ORAL | 3 refills | Status: DC

## 2024-05-27 NOTE — Patient Instructions (Signed)
 Medication Instructions:  Your physician recommends that you continue on your current medications as directed. Please refer to the Current Medication list given to you today.  *If you need a refill on your cardiac medications before your next appointment, please call your pharmacy*  Lab Work: Fasting Lipid Panel  If you have labs (blood work) drawn today and your tests are completely normal, you will receive your results only by: MyChart Message (if you have MyChart) OR A paper copy in the mail If you have any lab test that is abnormal or we need to change your treatment, we will call you to review the results.  Testing/Procedures: Your physician has recommended that you have a pulmonary function test. Pulmonary Function Tests are a group of tests that measure how well air moves in and out of your lungs.   Follow-Up: At [facility], you and your health needs are our priority.  As part of our continuing mission to provide you with exceptional heart care, our providers are all part of one team.  This team includes your primary Cardiologist (physician) and Advanced Practice Providers or APPs (Physician Assistants and Nurse Practitioners) who all work together to provide you with the care you need, when you need it.  Your next appointment:   1 year(s)  Provider:   You may see Vishnu P Mallipeddi, MD or one of the following Advanced Practice Providers on your designated Care Team:   [place], PA-C  Scotesia Kinsman, [place] Theotis Flake, [place]     We recommend signing up for the patient portal called "MyChart".  Sign up information is provided on this After Visit Summary.  MyChart is used to connect with patients for Virtual Visits (Telemedicine).  Patients are able to view lab/test results, encounter notes, upcoming appointments, etc.  Non-urgent messages can be sent to your provider as well.   To learn more about what you can do with MyChart, go to [email or web address].    Other Instructions

## 2024-05-27 NOTE — Progress Notes (Signed)
 Cardiology Office Note  Date: 05/27/2024   ID: Bader, Stubblefield [birth year], MRN [id number]  PCP:  [facility]  Cardiologist:  Lasalle Pointer, MD Electrophysiologist:  None   History of Present Illness: Herbert Torres is a 78 y.o. male known to have CAD status post CABG in 2021, persistent A-fib  s/p multiple DCCV, HLD, HTN, DM 2 is here for follow-up visit.  Symptomatic with new onset A-fib for 1 month prior to undergoing DCCV in April 2025.  Successfully converted to NSR.  Amiodarone  was discontinued in the last clinic visit due to concern for side effects.  He went into A-fib off amiodarone .  After DCCV, he is back on amiodarone .  Doing great after DCCV.  No angina, DOE, fatigue, palpitations.  Compliant with medications.  No bleeding complications.  Past Medical History:  Diagnosis Date   Coronary artery disease    a. s/p CABG x 4 with LIMA-LAD, rSVG-D1, rSVG-OM1 and rSVG-OM3 and LAA clipping in 09/2020   Diabetes mellitus without complication (HCC)    GERD (gastroesophageal reflux disease)    HOH (hard of hearing)    AIDS   Hyperlipidemia    Hypertension    PAF (paroxysmal atrial fibrillation) (HCC)     Past Surgical History:  Procedure Laterality Date   APPENDECTOMY     CARDIOVERSION N/A 10/27/2020   Procedure: CARDIOVERSION;  Surgeon: Lenise Quince, MD;  Location: [facility] ENDOSCOPY;  Service: Cardiovascular;  Laterality: N/A;   CARDIOVERSION N/A 03/27/2022   Procedure: CARDIOVERSION;  Surgeon: Laurann Pollock, MD;  Location: [facility] ORS;  Service: Endoscopy;  Laterality: N/A;   CARDIOVERSION N/A 03/30/2024   Procedure: CARDIOVERSION;  Surgeon: Jerryl Morin, DO;  Location: [facility] INVASIVE CV LAB;  Service: Cardiovascular;  Laterality: N/A;   CATARACT EXTRACTION W/PHACO Right 02/26/2018   Procedure: CATARACT EXTRACTION PHACO AND INTRAOCULAR LENS PLACEMENT (IOC);  Surgeon: Rosa College, MD;  Location: [facility] ORS;  Service: Ophthalmology;  Laterality:  Right;  fluid pak loT# [id number] H  exp10/31/2020 US    00:38.9 AP%   16.3 CDE   6.33   CLIPPING OF ATRIAL APPENDAGE Left 10/18/2020   Procedure: CLIPPING OF LEFT ATRIAL APPENDAGE USING ATRICURE ATRICLIP SIZE 40;  Surgeon: Hilarie Lovely, MD;  Location: [facility] OR;  Service: Open Heart Surgery;  Laterality: Left;   COLONOSCOPY     CORONARY ARTERY BYPASS GRAFT N/A 10/18/2020   Procedure: CORONARY ARTERY BYPASS GRAFTING (CABG) TIMES FOUR USING LEFT INTERNAL MAMMARY ARTERY AND RIGHT LEG GREATER SAPHENOUS VEIN HARVESTED ENDOSCOPICALLY Flowtrack only;  Surgeon: Hilarie Lovely, MD;  Location: [facility] OR;  Service: Open Heart Surgery;  Laterality: N/A;   IR THORACENTESIS ASP PLEURAL SPACE W/IMG GUIDE  10/23/2020   LEFT HEART CATH AND CORONARY ANGIOGRAPHY N/A 10/16/2020   Procedure: LEFT HEART CATH AND CORONARY ANGIOGRAPHY;  Surgeon: Arleen Lacer, MD;  Location: [facility] INVASIVE CV LAB;  Service: Cardiovascular;  Laterality: N/A;   TEE WITHOUT CARDIOVERSION N/A 10/18/2020   Procedure: TRANSESOPHAGEAL ECHOCARDIOGRAM (TEE);  Surgeon: Hilarie Lovely, MD;  Location: [facility] OR;  Service: Open Heart Surgery;  Laterality: N/A;    Current Outpatient Medications  Medication Sig Dispense Refill   ACCU-CHEK AVIVA PLUS test strip 1 each by Other route as needed for other.     amiodarone  (PACERONE ) 200 MG tablet Take 1 tablet (200 mg total) by mouth daily. 90 tablet 2   apixaban  (ELIQUIS ) 5 MG TABS tablet Take 1 tablet (5 mg total) by mouth in the morning  and at bedtime. 28 tablet 0   diltiazem  (CARDIZEM ) 30 MG tablet Take 1 tablet (30 mg total) by mouth every 8 (eight) hours as needed. 270 tablet 3   docusate sodium  (COLACE) 100 MG capsule Take 100 mg by mouth daily.     empagliflozin (JARDIANCE) 10 MG TABS tablet Take 10 mg by mouth daily.     ezetimibe  (ZETIA ) 10 MG tablet TAKE 1 TABLET BY MOUTH EVERY DAY 90 tablet 2   FEROSUL 325 (65 Fe) MG tablet Take 325 mg by mouth daily with breakfast.     furosemide   (LASIX ) 20 MG tablet Take 1 tablet (20 mg total) by mouth daily as needed for fluid or edema. 30 tablet 6   losartan (COZAAR) 25 MG tablet Take 25 mg by mouth daily.     metFORMIN  (GLUCOPHAGE ) 1000 MG tablet Take 1,000 mg by mouth 2 (two) times daily.  4   omeprazole (PRILOSEC) 20 MG capsule Take 20 mg by mouth daily.  3   rosuvastatin  (CRESTOR ) 20 MG tablet Take 1 tablet (20 mg total) by mouth daily. 90 tablet 3   tamsulosin (FLOMAX) 0.4 MG CAPS capsule Take 0.8 mg by mouth daily.     TRADJENTA 5 MG TABS tablet Take 5 mg by mouth daily.     metoprolol  succinate (TOPROL -XL) 50 MG 24 hr tablet Take 1.5 tablets (75 mg total) by mouth daily. Take with or immediately following a meal. 135 tablet 3   No current facility-administered medications for this visit.   Allergies:  Atorvastatin    Social History: The patient  reports that he has quit smoking. He has never used smokeless tobacco. He reports that he does not currently use drugs. He reports that he does not drink alcohol.   Family History: The patient's family history includes Heart attack in his brother, father, mother, and sister.   ROS:  Please see the history of present illness. Otherwise, complete review of systems is positive for none.  All other systems are reviewed and negative.   Physical Exam: VS:  BP 118/64 (BP Location: Left Arm, Patient Position: Sitting, Cuff Size: Normal)   Pulse 62   Ht 6' (1.829 m)   Wt 223 lb (101.2 kg)   BMI 30.24 kg/m , BMI Body mass index is 30.24 kg/m.  Wt Readings from Last 3 Encounters:  05/27/24 223 lb (101.2 kg)  04/13/24 223 lb 12.8 oz (101.5 kg)  03/24/24 223 lb 9.6 oz (101.4 kg)    General: Patient appears comfortable at rest. HEENT: Conjunctiva and lids normal, oropharynx clear with moist mucosa. Neck: Supple, no elevated JVP or carotid bruits, no thyromegaly. Lungs: Clear to auscultation, nonlabored breathing at rest. Cardiac: Regular rate and rhythm, no S3 or significant systolic  murmur, no pericardial rub. Abdomen: Soft, nontender, no hepatomegaly, bowel sounds present, no guarding or rebound. Extremities: No pitting edema, distal pulses 2+. Skin: Warm and dry. Musculoskeletal: No kyphosis. Neuropsychiatric: Alert and oriented x3, affect grossly appropriate.  Recent Labwork: 02/02/2024: B Natriuretic Peptide 310.0 02/19/2024: Magnesium  1.4; TSH 2.113 03/24/2024: Platelets 278 03/30/2024: BUN 12; Creatinine, Ser 1.00; Hemoglobin 11.6; Potassium 4.4; Sodium 139     Component Value Date/Time   CHOL 104 03/21/2022 1029   CHOL 110 09/04/2021 0843   TRIG 161 (H) 03/21/2022 1029   HDL 37 (L) 03/21/2022 1029   HDL 42 09/04/2021 0843   CHOLHDL 2.8 03/21/2022 1029   VLDL 32 03/21/2022 1029   LDLCALC 35 03/21/2022 1029   LDLCALC 44 09/04/2021 0843  Assessment and Plan:  Persistent A-fib s/p DCCV in 2021 and April 2025: Asymptomatic since DCCV in April 2025. Patient had postop A-fib after CABG in 2021 which resolved with amiodarone . He had another episode of A-fib for which she underwent DCCV in 2021.  No recurrences of A-fib since then except that he was found to have 10% A-fib burden on event monitor in 07/2022 and was sent to A-fib clinic who recommended to continue amiodarone .  Amio was discontinued due to concern for side effects and now he is back on amiodarone .  Continue metoprolol  succinate for send 5 mg once daily and Eliquis  5 mg twice daily (given the patient underwent left atrial appendage clipping, due to recurrent A-fib, will continue Eliquis ).  TSH within normal limits.  Obtain baseline PFTs.  Never underwent PFTs before.  Instructed to get ophthalmology examination annually.  Patient verbalized understanding.  CAD manifested by NSTEMI in 2021 s/p 4 vessel CABG (LIMA to LAD, SVG to D1, OM1, OM 3 and LAA clipping), angina free: Not on aspirin  due to Eliquis  use, continue rosuvastatin  20 mg nightly.  HLD, at goal: LDL 35 in 2023.  Had myalgias with atorvastatin .   Continue rosuvastatin  20 mg nightly and Zetia  10 mg once daily.  Goal LDL is 70.  Obtain fasting lipid panel today.  HTN, controlled: Continue current antihypertensives, losartan 25 mg once daily and metoprolol  succinate 75 mg once daily.  Trivial atrial regurgitation in 2023: Will repeat echocardiogram in 5 years.    Medication Adjustments/Labs and Tests Ordered: Current medicines are reviewed at length with the patient today.  Concerns regarding medicines are outlined above.    Disposition:  Follow up 1 year  Signed, Mikeria Valin Beauford Bounds, MD, 05/27/2024 10:57 AM    [facility] [address] [place], [place] [address]

## 2024-06-17 ENCOUNTER — Ambulatory Visit (HOSPITAL_COMMUNITY)
Admission: RE | Admit: 2024-06-17 | Discharge: 2024-06-17 | Disposition: A | Discharge status: Home / Self Care | Source: Ambulatory Visit | Attending: Internal Medicine | Admitting: Internal Medicine

## 2024-06-17 DIAGNOSIS — J988 Other specified respiratory disorders: Secondary | ICD-10-CM | POA: Insufficient documentation

## 2024-06-17 DIAGNOSIS — Z9229 Personal history of other drug therapy: Secondary | ICD-10-CM | POA: Insufficient documentation

## 2024-06-17 DIAGNOSIS — F17210 Nicotine dependence, cigarettes, uncomplicated: Secondary | ICD-10-CM | POA: Diagnosis not present

## 2024-06-17 LAB — PULMONARY FUNCTION TEST
DL/VA % pred: 71 %
DL/VA: 2.72 ml/min/mmHg/L
DLCO unc % pred: 50 %
DLCO unc: 12.96 ml/min/mmHg
FEF 25-75 Post: 0.93 L/s
FEF 25-75 Pre: 1.38 L/s
FEF2575-%Change-Post: -32 %
FEF2575-%Pred-Post: 41 %
FEF2575-%Pred-Pre: 62 %
FEV1-%Change-Post: -4 %
FEV1-%Pred-Post: 64 %
FEV1-%Pred-Pre: 67 %
FEV1-Post: 2.05 L
FEV1-Pre: 2.15 L
FEV1FVC-%Change-Post: 8 %
FEV1FVC-%Pred-Pre: 96 %
FEV6-%Change-Post: -10 %
FEV6-%Pred-Post: 66 %
FEV6-%Pred-Pre: 74 %
FEV6-Post: 2.74 L
FEV6-Pre: 3.07 L
FEV6FVC-%Change-Post: 0 %
FEV6FVC-%Pred-Post: 106 %
FEV6FVC-%Pred-Pre: 106 %
FVC-%Change-Post: -12 %
FVC-%Pred-Post: 62 %
FVC-%Pred-Pre: 70 %
FVC-Post: 2.74 L
FVC-Pre: 3.12 L
Post FEV1/FVC ratio: 75 %
Post FEV6/FVC ratio: 100 %
Pre FEV1/FVC ratio: 69 %
Pre FEV6/FVC Ratio: 100 %
RV % pred: 92 %
RV: 2.53 L
TLC % pred: 77 %
TLC: 5.77 L

## 2024-06-17 MED ORDER — ALBUTEROL SULFATE (2.5 MG/3ML) 0.083% IN NEBU
2.5000 mg | INHALATION_SOLUTION | Freq: Once | RESPIRATORY_TRACT | Status: AC
  Administered 2024-06-17: 2.5 mg via RESPIRATORY_TRACT

## 2024-10-13 ENCOUNTER — Ambulatory Visit (HOSPITAL_COMMUNITY)
Admission: RE | Admit: 2024-10-13 | Discharge: 2024-10-13 | Disposition: A | Discharge status: Home / Self Care | Source: Ambulatory Visit | Attending: Physician Assistant | Admitting: Physician Assistant

## 2024-10-13 VITALS — BP 140/54 | HR 59 | Ht 72.0 in | Wt 228.6 lb

## 2024-10-13 DIAGNOSIS — Z79899 Other long term (current) drug therapy: Secondary | ICD-10-CM

## 2024-10-13 DIAGNOSIS — D6869 Other thrombophilia: Secondary | ICD-10-CM

## 2024-10-13 DIAGNOSIS — Z5181 Encounter for therapeutic drug level monitoring: Secondary | ICD-10-CM

## 2024-10-13 DIAGNOSIS — I4819 Other persistent atrial fibrillation: Secondary | ICD-10-CM | POA: Diagnosis not present

## 2024-10-13 DIAGNOSIS — I4891 Unspecified atrial fibrillation: Secondary | ICD-10-CM

## 2024-10-13 MED ORDER — METOPROLOL SUCCINATE ER 50 MG PO TB24: 50.0000 mg | ORAL_TABLET | Freq: Every day | ORAL | 2 refills | Status: AC

## 2024-10-13 NOTE — Progress Notes (Signed)
 Primary Care Physician: [facility], [facility] Primary Cardiologist: Vishnu P Mallipeddi, MD Electrophysiologist: None  Referring Physician: Dr Shlomo Vinie LELON Herbert Torres is a 78 y.o. male with a history of CAD s/p CABG 2021, DM, HLD, HTN, atrial fibrillation who presents for follow up in the [facility]rial [facility]. Patient had postop A-fib after CABG in 2021 which resolved with amiodarone . He had another episode of A-fib for which he underwent DCCV in 11/52021 and 03/27/22.  His amiodarone  was discontinued 08/06/23 to avoid possible long term side effects as he was maintaining SR. He presented to the ED 02/02/24 with chest pain and was found to be back in afib. He underwent DCCV at that time and his amiodarone  was resumed. Seen by Cadence Franchester 02/19/24 and patient was back in afib. His metoprolol  was increased. Patient is on Eliquis  for stroke prevention. Patient was set up for DCCV but his labs showed decline in his chronically low hgb 10.7 > 10.0 > 9.8 and his DCCV was cancelled pending evaluation. He was seen by his PCP and started on iron supplementation. He is s/p DCCV on 03/30/24.   Patient returns for follow up for atrial fibrillation and amiodarone  monitoring. He is in SR today. He denies any interim symptoms of afib. He does report feeling fatigued with exertion over the past 5-6 months. No bleeding issues on anticoagulation.   Today, he  denies symptoms of palpitations, chest pain, shortness of breath, orthopnea, PND, lower extremity edema, dizziness, presyncope, syncope, snoring, daytime somnolence, bleeding, or neurologic sequela. The patient is tolerating medications without difficulties and is otherwise without complaint today.    Atrial Fibrillation Risk Factors:  he does not have symptoms or diagnosis of sleep apnea. he does not have a history of rheumatic fever.   Atrial Fibrillation Management history:  Previous antiarrhythmic drugs: amiodarone   Previous  cardioversions: 10/27/20, 03/27/22, 02/02/24, 03/30/24 Previous ablations: none Anticoagulation history: Eliquis   ROS- All systems are reviewed and negative except as per the HPI above.  Past Medical History:  Diagnosis Date   Coronary artery disease    a. s/p CABG x 4 with LIMA-LAD, rSVG-D1, rSVG-OM1 and rSVG-OM3 and LAA clipping in 09/2020   Diabetes mellitus without complication (HCC)    GERD (gastroesophageal reflux disease)    HOH (hard of hearing)    AIDS   Hyperlipidemia    Hypertension    PAF (paroxysmal atrial fibrillation) (HCC)     Current Outpatient Medications  Medication Sig Dispense Refill   ACCU-CHEK AVIVA PLUS test strip 1 each by Other route as needed for other.     amiodarone  (PACERONE ) 200 MG tablet Take 1 tablet (200 mg total) by mouth daily. 90 tablet 2   apixaban  (ELIQUIS ) 5 MG TABS tablet Take 1 tablet (5 mg total) by mouth in the morning and at bedtime. 28 tablet 0   diltiazem  (CARDIZEM ) 30 MG tablet Take 1 tablet (30 mg total) by mouth every 8 (eight) hours as needed. 270 tablet 3   docusate sodium  (COLACE) 100 MG capsule Take 100 mg by mouth daily.     empagliflozin (JARDIANCE) 10 MG TABS tablet Take 10 mg by mouth daily.     ezetimibe  (ZETIA ) 10 MG tablet TAKE 1 TABLET BY MOUTH EVERY DAY 90 tablet 2   FEROSUL 325 (65 Fe) MG tablet Take 325 mg by mouth daily with breakfast.     furosemide  (LASIX ) 20 MG tablet Take 1 tablet (20 mg total) by mouth daily as  needed for fluid or edema. (Patient taking differently: Take 20 mg by mouth as needed for fluid or edema.) 30 tablet 6   losartan (COZAAR) 25 MG tablet Take 25 mg by mouth daily.     meclizine (ANTIVERT) 25 MG tablet Take 25 mg by mouth as needed.     metFORMIN  (GLUCOPHAGE ) 1000 MG tablet Take 1,000 mg by mouth 2 (two) times daily.  4   metoprolol  succinate (TOPROL -XL) 50 MG 24 hr tablet Take 1.5 tablets (75 mg total) by mouth daily. Take with or immediately following a meal. 135 tablet 3   omeprazole (PRILOSEC)  20 MG capsule Take 20 mg by mouth daily.  3   rosuvastatin  (CRESTOR ) 20 MG tablet Take 1 tablet (20 mg total) by mouth daily. 90 tablet 3   tamsulosin (FLOMAX) 0.4 MG CAPS capsule Take 0.8 mg by mouth daily.     TRADJENTA 5 MG TABS tablet Take 5 mg by mouth daily.     No current facility-administered medications for this encounter.    Physical Exam: BP (!) 140/54   Pulse (!) 59   Ht 6' (1.829 m)   Wt 103.7 kg   BMI 31.00 kg/m   GEN: Well nourished, well developed in no acute distress CARDIAC: Regular rate and rhythm, no murmurs, rubs, gallops RESPIRATORY:  Clear to auscultation without rales, wheezing or rhonchi  ABDOMEN: Soft, non-tender, non-distended EXTREMITIES:  No edema; No deformity    Wt Readings from Last 3 Encounters:  10/13/24 103.7 kg  05/27/24 101.2 kg  04/13/24 101.5 kg     EKG today demonstrates  SB, PACs Vent. rate 59 BPM PR interval 194 ms QRS duration 92 ms QT/QTcB 462/457 ms   Echo 03/04/24 demonstrated   1. Left ventricular ejection fraction, by estimation, is 55 to 60%. The  left ventricle has normal function. Left ventricular endocardial border  not optimally defined to evaluate regional wall motion. There is mild left  ventricular hypertrophy. Left ventricular diastolic function could not be evaluated.   2. Right ventricular systolic function is normal. The right ventricular  size is normal. There is normal pulmonary artery systolic pressure.   3. Left atrial size was mildly dilated.   4. Right atrial size was moderately dilated.   5. The mitral valve is normal in structure. No evidence of mitral valve  regurgitation. No evidence of mitral stenosis.   6. The aortic valve is tricuspid. There is mild calcification of the  aortic valve. Aortic valve regurgitation is not visualized. No aortic  stenosis is present.   7. The inferior vena cava is dilated in size with >50% respiratory  variability, suggesting right atrial pressure of 8 mmHg.    Comparison(s): No significant change from prior study.    CHA2DS2-VASc Score = 5  The patient's score is based upon: CHF History: 0 HTN History: 1 Diabetes History: 1 Stroke History: 0 Vascular Disease History: 1 Age Score: 2 Gender Score: 0       ASSESSMENT AND PLAN: Persistent Atrial Fibrillation (ICD10:  I48.19) The patient's CHA2DS2-VASc score is 5, indicating a 7.2% annual risk of stroke.   Patient appears to be maintaining SR Continue amiodarone  200 mg daily Will decrease Toprol  to 50 mg daily to see if this helps with fatigue.  Continue Eliquis  5 mg BID  Secondary Hypercoagulable State (ICD10:  D68.69) The patient is at significant risk for stroke/thromboembolism based upon his CHA2DS2-VASc Score of 5.  Continue Apixaban  (Eliquis ). No bleeding issues.   High Risk Medication  Monitoring (ICD 10: Z79.899) Patient requires ongoing monitoring for anti-arrhythmic medication which has the potential to cause life threatening arrhythmias. Intervals on ECG acceptable for amiodarone  monitoring. Check cmet/TSH today.     CAD S/p CABG 2021 No anginal symptoms Followed by Dr Stacia  HTN Stable on current regimen  Follow up in the AF clinic in 6 months.    Daril Kicks PA-C Af[facility] [address] [place], [place] [address] [phone]

## 2024-10-13 NOTE — Patient Instructions (Signed)
 Decrease metoprolol to 50 mg once daily

## 2024-10-14 ENCOUNTER — Ambulatory Visit (HOSPITAL_COMMUNITY): Payer: Self-pay | Admitting: Physician Assistant

## 2024-10-14 LAB — COMPREHENSIVE METABOLIC PANEL WITH GFR
ALT: 7 IU/L (ref 0–44)
AST: 13 IU/L (ref 0–40)
Albumin: 4.2 g/dL (ref 3.8–4.8)
Alkaline Phosphatase: 58 IU/L (ref 47–123)
BUN/Creatinine Ratio: 12 (ref 10–24)
BUN: 14 mg/dL (ref 8–27)
Bilirubin Total: 0.3 mg/dL (ref 0.0–1.2)
CO2: 17 mmol/L — ABNORMAL LOW (ref 20–29)
Calcium: 8.7 mg/dL (ref 8.6–10.2)
Chloride: 103 mmol/L (ref 96–106)
Creatinine, Ser: 1.13 mg/dL (ref 0.76–1.27)
Globulin, Total: 2.8 g/dL (ref 1.5–4.5)
Glucose: 145 mg/dL — ABNORMAL HIGH (ref 70–99)
Potassium: 4.5 mmol/L (ref 3.5–5.2)
Sodium: 139 mmol/L (ref 134–144)
Total Protein: 7 g/dL (ref 6.0–8.5)
eGFR: 66 mL/min/1.73 (ref >59)

## 2024-10-14 LAB — TSH: TSH: 2.37 u[IU]/mL (ref 0.450–4.500)

## 2025-04-13 ENCOUNTER — Ambulatory Visit (HOSPITAL_COMMUNITY): Admitting: Physician Assistant
